# Patient Record
Sex: Female | Born: 1985
Health system: Southern US, Community
[De-identification: ages and names within clinical notes are randomized; demographics above are authoritative.]

## PROBLEM LIST (undated history)

## (undated) ENCOUNTER — Inpatient Hospital Stay (HOSPITAL_COMMUNITY): Payer: Self-pay

## (undated) DIAGNOSIS — O99345 Other mental disorders complicating the puerperium: Secondary | ICD-10-CM

## (undated) DIAGNOSIS — K219 Gastro-esophageal reflux disease without esophagitis: Secondary | ICD-10-CM

## (undated) DIAGNOSIS — F53 Postpartum depression: Secondary | ICD-10-CM

## (undated) DIAGNOSIS — F419 Anxiety disorder, unspecified: Secondary | ICD-10-CM

## (undated) DIAGNOSIS — Z9889 Other specified postprocedural states: Secondary | ICD-10-CM

## (undated) DIAGNOSIS — R112 Nausea with vomiting, unspecified: Secondary | ICD-10-CM

## (undated) DIAGNOSIS — N200 Calculus of kidney: Secondary | ICD-10-CM

## (undated) DIAGNOSIS — O1490 Unspecified pre-eclampsia, unspecified trimester: Secondary | ICD-10-CM

## (undated) DIAGNOSIS — R519 Headache, unspecified: Secondary | ICD-10-CM

## (undated) DIAGNOSIS — B009 Herpesviral infection, unspecified: Secondary | ICD-10-CM

## (undated) DIAGNOSIS — O24419 Gestational diabetes mellitus in pregnancy, unspecified control: Secondary | ICD-10-CM

## (undated) DIAGNOSIS — K4090 Unilateral inguinal hernia, without obstruction or gangrene, not specified as recurrent: Secondary | ICD-10-CM

## (undated) DIAGNOSIS — N390 Urinary tract infection, site not specified: Secondary | ICD-10-CM

## (undated) DIAGNOSIS — Z87442 Personal history of urinary calculi: Secondary | ICD-10-CM

## (undated) HISTORY — PX: ABDOMINAL HYSTERECTOMY: SHX81

## (undated) HISTORY — DX: Anxiety disorder, unspecified: F41.9

## (undated) HISTORY — DX: Urinary tract infection, site not specified: N39.0

## (undated) HISTORY — DX: Calculus of kidney: N20.0

## (undated) HISTORY — DX: Postpartum depression: F53.0

## (undated) HISTORY — PX: WISDOM TOOTH EXTRACTION: SHX21

## (undated) HISTORY — PX: TONSILLECTOMY: SUR1361

## (undated) HISTORY — DX: Herpesviral infection, unspecified: B00.9

## (undated) HISTORY — DX: Other mental disorders complicating the puerperium: O99.345

## (undated) HISTORY — DX: Unilateral inguinal hernia, without obstruction or gangrene, not specified as recurrent: K40.90

## (undated) HISTORY — PX: INGUINAL HERNIA REPAIR: SHX194

---
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
## 2004-03-21 ENCOUNTER — Other Ambulatory Visit: Admission: RE | Admit: 2004-03-21 | Discharge: 2004-03-21 | Payer: Self-pay | Admitting: Obstetrics and Gynecology

## 2004-10-05 HISTORY — PX: TYMPANOPLASTY: SHX33

## 2005-03-31 ENCOUNTER — Other Ambulatory Visit: Admission: RE | Admit: 2005-03-31 | Discharge: 2005-03-31 | Payer: Self-pay | Admitting: Obstetrics and Gynecology

## 2005-10-05 HISTORY — PX: CYSTOSCOPY: SUR368

## 2006-04-01 ENCOUNTER — Other Ambulatory Visit: Admission: RE | Admit: 2006-04-01 | Discharge: 2006-04-01 | Payer: Self-pay | Admitting: Obstetrics & Gynecology

## 2007-04-05 ENCOUNTER — Other Ambulatory Visit: Admission: RE | Admit: 2007-04-05 | Discharge: 2007-04-05 | Payer: Self-pay | Admitting: Obstetrics and Gynecology

## 2007-04-25 ENCOUNTER — Other Ambulatory Visit: Admission: RE | Admit: 2007-04-25 | Discharge: 2007-04-25 | Payer: Self-pay | Admitting: Obstetrics and Gynecology

## 2007-08-24 ENCOUNTER — Other Ambulatory Visit: Admission: RE | Admit: 2007-08-24 | Discharge: 2007-08-24 | Payer: Self-pay | Admitting: Obstetrics and Gynecology

## 2008-04-26 ENCOUNTER — Other Ambulatory Visit: Admission: RE | Admit: 2008-04-26 | Discharge: 2008-04-26 | Payer: Self-pay | Admitting: Obstetrics and Gynecology

## 2012-07-29 LAB — HM PAP SMEAR

## 2013-07-28 ENCOUNTER — Encounter: Payer: Self-pay | Admitting: Obstetrics & Gynecology

## 2013-07-31 ENCOUNTER — Encounter: Payer: Self-pay | Admitting: Nurse Practitioner

## 2013-07-31 ENCOUNTER — Ambulatory Visit (INDEPENDENT_AMBULATORY_CARE_PROVIDER_SITE_OTHER): Payer: BC Managed Care – PPO | Admitting: Nurse Practitioner

## 2013-07-31 VITALS — BP 112/74 | HR 80 | Ht 64.5 in | Wt 133.0 lb

## 2013-07-31 DIAGNOSIS — Z01419 Encounter for gynecological examination (general) (routine) without abnormal findings: Secondary | ICD-10-CM

## 2013-07-31 MED ORDER — DROSPIRENONE-ETHINYL ESTRADIOL 3-0.03 MG PO TABS: 1.0000 | ORAL_TABLET | Freq: Every day | ORAL | Status: DC

## 2013-07-31 NOTE — Patient Instructions (Signed)
General topics  Next pap or exam is  due in 1 year Take a Women's multivitamin Take 1200 mg. of calcium daily - prefer dietary If any concerns in interim to call back  Breast Self-Awareness Practicing breast self-awareness may pick up problems early, prevent significant medical complications, and possibly save your life. By practicing breast self-awareness, you can become familiar with how your breasts look and feel and if your breasts are changing. This allows you to notice changes early. It can also offer you some reassurance that your breast health is good. One way to learn what is normal for your breasts and whether your breasts are changing is to do a breast self-exam. If you find a lump or something that was not present in the past, it is best to contact your caregiver right away. Other findings that should be evaluated by your caregiver include nipple discharge, especially if it is bloody; skin changes or reddening; areas where the skin seems to be pulled in (retracted); or new lumps and bumps. Breast pain is seldom associated with cancer (malignancy), but should also be evaluated by a caregiver. BREAST SELF-EXAM The best time to examine your breasts is 5 7 days after your menstrual period is over.  ExitCare Patient Information 2013 [employer].   Exercise to Stay Healthy Exercise helps you become and stay healthy. EXERCISE IDEAS AND TIPS Choose exercises that:  You enjoy.  Fit into your day. You do not need to exercise really hard to be healthy. You can do exercises at a slow or medium level and stay healthy. You can:  Stretch before and after working out.  Try yoga, Pilates, or tai chi.  Lift weights.  Walk fast, swim, jog, run, climb stairs, bicycle, dance, or rollerskate.  Take aerobic classes. Exercises that burn about 150 calories:  Running 1  miles in 15 minutes.  Playing volleyball for 45 to 60 minutes.  Washing and waxing a car for 45 to 60  minutes.  Playing touch football for 45 minutes.  Walking 1  miles in 35 minutes.  Pushing a stroller 1  miles in 30 minutes.  Playing basketball for 30 minutes.  Raking leaves for 30 minutes.  Bicycling 5 miles in 30 minutes.  Walking 2 miles in 30 minutes.  Dancing for 30 minutes.  Shoveling snow for 15 minutes.  Swimming laps for 20 minutes.  Walking up stairs for 15 minutes.  Bicycling 4 miles in 15 minutes.  Gardening for 30 to 45 minutes.  Jumping rope for 15 minutes.  Washing windows or floors for 45 to 60 minutes. Document Released: 10/24/2010 Document Revised: 12/14/2011 Document Reviewed: 10/24/2010 ExitCare Patient Information 2013 [employer].   Other topics ( that may be useful information):    Sexually Transmitted Disease Sexually transmitted disease (STD) refers to any infection that is passed from person to person during sexual activity. This may happen by way of saliva, semen, blood, vaginal mucus, or urine. Common STDs include:  Gonorrhea.  Chlamydia.  Syphilis.  HIV/AIDS.  Genital herpes.  Hepatitis B and C.  Trichomonas.  Human papillomavirus (HPV).  Pubic lice. CAUSES  An STD may be spread by bacteria, virus, or parasite. A person can get an STD by:  Sexual intercourse with an infected person.  Sharing sex toys with an infected person.  Sharing needles with an infected person.  Having intimate contact with the genitals, mouth, or rectal areas of an infected person. SYMPTOMS  Some people may not have any symptoms, but   they can still pass the infection to others. Different STDs have different symptoms. Symptoms include:  Painful or bloody urination.  Pain in the pelvis, abdomen, vagina, anus, throat, or eyes.  Skin rash, itching, irritation, growths, or sores (lesions). These usually occur in the genital or anal area.  Abnormal vaginal discharge.  Penile discharge in men.  Soft, flesh-colored skin growths in the  genital or anal area.  Fever.  Pain or bleeding during sexual intercourse.  Swollen glands in the groin area.  Yellow skin and eyes (jaundice). This is seen with hepatitis. DIAGNOSIS  To make a diagnosis, your caregiver may:  Take a medical history.  Perform a physical exam.  Take a specimen (culture) to be examined.  Examine a sample of discharge under a microscope.  Perform blood test TREATMENT   Chlamydia, gonorrhea, trichomonas, and syphilis can be cured with antibiotic medicine.  Genital herpes, hepatitis, and HIV can be treated, but not cured, with prescribed medicines. The medicines will lessen the symptoms.  Genital warts from HPV can be treated with medicine or by freezing, burning (electrocautery), or surgery. Warts may come back.  HPV is a virus and cannot be cured with medicine or surgery.However, abnormal areas may be followed very closely by your caregiver and may be removed from the cervix, vagina, or vulva through office procedures or surgery. If your diagnosis is confirmed, your recent sexual partners need treatment. This is true even if they are symptom-free or have a negative culture or evaluation. They should not have sex until their caregiver says it is okay. HOME CARE INSTRUCTIONS  All sexual partners should be informed, tested, and treated for all STDs.  Take your antibiotics as directed. Finish them even if you start to feel better.  Only take over-the-counter or prescription medicines for pain, discomfort, or fever as directed by your caregiver.  Rest.  Eat a balanced diet and drink enough fluids to keep your urine clear or pale yellow.  Do not have sex until treatment is completed and you have followed up with your caregiver. STDs should be checked after treatment.  Keep all follow-up appointments, Pap tests, and blood tests as directed by your caregiver.  Only use latex condoms and water-soluble lubricants during sexual activity. Do not use  petroleum jelly or oils.  Avoid alcohol and illegal drugs.  Get vaccinated for HPV and hepatitis. If you have not received these vaccines in the past, talk to your caregiver about whether one or both might be right for you.  Avoid risky sex practices that can break the skin. The only way to avoid getting an STD is to avoid all sexual activity.Latex condoms and dental dams (for oral sex) will help lessen the risk of getting an STD, but will not completely eliminate the risk. SEEK MEDICAL CARE IF:   You have a fever.  You have any new or worsening symptoms. Document Released: 12/12/2002 Document Revised: 12/14/2011 Document Reviewed: 12/19/2010 ExitCare Patient Information 2013 [employer].    Domestic Abuse You are being battered or abused if someone close to you hits, pushes, or physically hurts you in any way. You also are being abused if you are forced into activities. You are being sexually abused if you are forced to have sexual contact of any kind. You are being emotionally abused if you are made to feel worthless or if you are constantly threatened. It is important to remember that help is available. No one has the right to abuse you. PREVENTION OF FURTHER   ABUSE  Learn the warning signs of danger. This varies with situations but may include: the use of alcohol, threats, isolation from friends and family, or forced sexual contact. Leave if you feel that violence is going to occur.  If you are attacked or beaten, report it to the police so the abuse is documented. You do not have to press charges. The police can protect you while you or the attackers are leaving. Get the officer's name and badge number and a copy of the report.  Find someone you can trust and tell them what is happening to you: your caregiver, a nurse, clergy member, close friend or family member. Feeling ashamed is natural, but remember that you have done nothing wrong. No one deserves abuse. Document Released:  09/18/2000 Document Revised: 12/14/2011 Document Reviewed: 11/27/2010 ExitCare Patient Information 2013 [employer].    How Much is Too Much Alcohol? Drinking too much alcohol can cause injury, accidents, and health problems. These types of problems can include:   Car crashes.  Falls.  Family fighting (domestic violence).  Drowning.  Fights.  Injuries.  Burns.  Damage to certain organs.  Having a baby with birth defects. ONE DRINK CAN BE TOO MUCH WHEN YOU ARE:  Working.  Pregnant or breastfeeding.  Taking medicines. Ask your doctor.  Driving or planning to drive. If you or someone you know has a drinking problem, get help from a doctor.  Document Released: 07/18/2009 Document Revised: 12/14/2011 Document Reviewed: 07/18/2009 ExitCare Patient Information 2013 [employer].   Smoking Hazards Smoking cigarettes is extremely bad for your health. Tobacco smoke has over 200 known poisons in it. There are over 60 chemicals in tobacco smoke that cause cancer. Some of the chemicals found in cigarette smoke include:   Cyanide.  Benzene.  Formaldehyde.  Methanol (wood alcohol).  Acetylene (fuel used in welding torches).  Ammonia. Cigarette smoke also contains the poisonous gases nitrogen oxide and carbon monoxide.  Cigarette smokers have an increased risk of many serious medical problems and Smoking causes approximately:  90% of all lung cancer deaths in men.  80% of all lung cancer deaths in women.  90% of deaths from chronic obstructive lung disease. Compared with nonsmokers, smoking increases the risk of:  Coronary heart disease by 2 to 4 times.  Stroke by 2 to 4 times.  Men developing lung cancer by 23 times.  Women developing lung cancer by 13 times.  Dying from chronic obstructive lung diseases by 12 times.  . Smoking is the most preventable cause of death and disease in our society.  WHY IS SMOKING ADDICTIVE?  Nicotine is the chemical  agent in tobacco that is capable of causing addiction or dependence.  When you smoke and inhale, nicotine is absorbed rapidly into the bloodstream through your lungs. Nicotine absorbed through the lungs is capable of creating a powerful addiction. Both inhaled and non-inhaled nicotine may be addictive.  Addiction studies of cigarettes and spit tobacco show that addiction to nicotine occurs mainly during the teen years, when young people begin using tobacco products. WHAT ARE THE BENEFITS OF QUITTING?  There are many health benefits to quitting smoking.   Likelihood of developing cancer and heart disease decreases. Health improvements are seen almost immediately.  Blood pressure, pulse rate, and breathing patterns start returning to normal soon after quitting. QUITTING SMOKING   American Lung Association - 1-800-LUNGUSA  American Cancer Society - 1-800-ACS-2345 Document Released: 10/29/2004 Document Revised: 12/14/2011 Document Reviewed: 07/03/2009 ExitCare Patient Information 2013 Exit[employer]   LL[employer].   Stress Management Stress is a state of physical or mental tension that often results from changes in your life or normal routine. Some common causes of stress are:  Death of a loved one.  Injuries or severe illnesses.  Getting fired or changing jobs.  Moving into a new home. Other causes may be:  Sexual problems.  Business or financial losses.  Taking on a large debt.  Regular conflict with someone at home or at work.  Constant tiredness from lack of sleep. It is not just bad things that are stressful. It may be stressful to:  Win the lottery.  Get married.  Buy a new car. The amount of stress that can be easily tolerated varies from person to person. Changes generally cause stress, regardless of the types of change. Too much stress can affect your health. It may lead to physical or emotional problems. Too little stress (boredom) may also become stressful. SUGGESTIONS TO  REDUCE STRESS:  Talk things over with your family and friends. It often is helpful to share your concerns and worries. If you feel your problem is serious, you may want to get help from a professional counselor.  Consider your problems one at a time instead of lumping them all together. Trying to take care of everything at once may seem impossible. List all the things you need to do and then start with the most important one. Set a goal to accomplish 2 or 3 things each day. If you expect to do too many in a single day you will naturally fail, causing you to feel even more stressed.  Do not use alcohol or drugs to relieve stress. Although you may feel better for a short time, they do not remove the problems that caused the stress. They can also be habit forming.  Exercise regularly - at least 3 times per week. Physical exercise can help to relieve that "uptight" feeling and will relax you.  The shortest distance between despair and hope is often a good night's sleep.  Go to bed and get up on time allowing yourself time for appointments without being rushed.  Take a short "time-out" period from any stressful situation that occurs during the day. Close your eyes and take some deep breaths. Starting with the muscles in your face, tense them, hold it for a few seconds, then relax. Repeat this with the muscles in your neck, shoulders, hand, stomach, back and legs.  Take good care of yourself. Eat a balanced diet and get plenty of rest.  Schedule time for having fun. Take a break from your daily routine to relax. HOME CARE INSTRUCTIONS   Call if you feel overwhelmed by your problems and feel you can no longer manage them on your own.  Return immediately if you feel like hurting yourself or someone else. Document Released: 03/17/2001 Document Revised: 12/14/2011 Document Reviewed: 11/07/2007 ExitCare Patient Information 2013 [employer].   

## 2013-07-31 NOTE — Progress Notes (Signed)
Patient ID: Traci Sweeney, female   DOB: 1986-03-08, 27 y.o.   MRN: [id number] 27 y.o. G0P0 Married Caucasian Fe here for annual exam.  Got married in July.   Doing very well.  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 They do not plan a pregnancy for a while.  Her cycles are normal on OCP.  With her PCP she is on Spironolactone for acne before the wedding and has remained on this.  Patient's last menstrual period was 07/26/2013.          Sexually active: yes  The current method of family planning is OCP (estrogen/progesterone).    Exercising: no   Smoker:  Former, quit 08/05/11  Health Maintenance: Pap: 07/29/12, WNL TDaP: ? Gardasil: completed in 2007 Labs: declined   reports that she quit smoking about 1 years ago. Her smoking use included Cigarettes. She smoked 0.00 packs per day for 8 years. She has never used smokeless tobacco. She reports that she drinks about 0.5 ounces of alcohol per week. She reports that she does not use illicit drugs.  Past Medical History  Diagnosis Date  . HSV-1 infection   . Inguinal hernia infant    bilateral    Past Surgical History  Procedure Laterality Date  . Inguinal hernia repair Bilateral infant    Current Outpatient Prescriptions  Medication Sig Dispense Refill  . Multiple Vitamins-Minerals (HAIR/SKIN/NAILS PO) Take 1 tablet by mouth daily.      Marland Kitchen spironolactone (ALDACTONE) 25 MG tablet Take 1 tablet by mouth daily.      Marland Kitchen ZARAH 3-0.03 MG tablet Take 1 tablet by mouth daily.       No current facility-administered medications for this visit.    Family History  Problem Relation Age of Onset  . Hypertension Father   . Cancer Maternal Uncle     ROS:  Pertinent items are noted in HPI.  Otherwise, a comprehensive ROS was negative.  Exam:   BP 112/74  Pulse 80  Ht 5' 4.5" (1.638 m)  Wt 133 lb (60.328 kg)  BMI 22.48 kg/m2  LMP 07/26/2013 Height: 5' 4.5" (163.8 cm)  Ht Readings from Last 3 Encounters:   07/31/13 5' 4.5" (1.638 m)    General appearance: alert, cooperative and appears stated age Head: Normocephalic, without obvious abnormality, atraumatic Neck: no adenopathy, supple, symmetrical, trachea midline and thyroid normal to inspection and palpation Lungs: clear to auscultation bilaterally Breasts: normal appearance, no masses or tenderness Heart: regular rate and rhythm Abdomen: soft, non-tender; no masses,  no organomegaly Extremities: extremities normal, atraumatic, no cyanosis or edema Skin: Skin color, texture, turgor normal. No rashes or lesions Lymph nodes: Cervical, supraclavicular, and axillary nodes normal. No abnormal inguinal nodes palpated Neurologic: Grossly normal   Pelvic: External genitalia:  no lesions              Urethra:  normal appearing urethra with no masses, tenderness or lesions              Bartholin's and Skene's: normal                 Vagina: normal appearing vagina with normal color and discharge, no lesions              Cervix: anteverted              Pap taken: no Bimanual Exam:  Uterus:  normal size, contour, position, consistency, mobility, non-tender  Adnexa: no mass, fullness, tenderness               Rectovaginal: Confirms               Anus:  normal sphincter tone, no lesions  A:  Well Woman with normal exam  Contraception   P:   Pap smear as per guidelines Not done today  Refill Yasmin for a year.  She will also be checking on immunization records to verify MMR II and TDaP  Counseled on breast self exam, adequate intake of calcium and vitamin D, diet and exercise return annually or prn  An After Visit Summary was printed and given to the patient.

## 2013-08-03 NOTE — Progress Notes (Signed)
Encounter reviewed by Dr. Brook Silva.  

## 2013-08-25 ENCOUNTER — Other Ambulatory Visit: Payer: Self-pay | Admitting: Nurse Practitioner

## 2014-04-13 ENCOUNTER — Encounter: Payer: Self-pay | Admitting: Nurse Practitioner

## 2014-04-13 ENCOUNTER — Ambulatory Visit (INDEPENDENT_AMBULATORY_CARE_PROVIDER_SITE_OTHER): Payer: BC Managed Care – PPO | Admitting: Nurse Practitioner

## 2014-04-13 VITALS — BP 114/62 | HR 68 | Ht 64.5 in | Wt 149.0 lb

## 2014-04-13 DIAGNOSIS — Z3169 Encounter for other general counseling and advice on procreation: Secondary | ICD-10-CM

## 2014-04-13 MED ORDER — FLUCONAZOLE 150 MG PO TABS: 150.0000 mg | ORAL_TABLET | Freq: Once | ORAL | Status: DC

## 2014-04-13 NOTE — Progress Notes (Deleted)
28 y.o. Married White female   G0P0 here for pre-conceptual counseling along with her husband of 1 year.     GYN Hx: Menarche:  Age 28 Patient's last menstrual period was 04/06/2014.Marland Kitchen.  Length of cycle:  28 Length of menses:  4-5 Any STD Hx?  Yes diagnosed with HSV I in 05/2007 Current birth control and last time used:  Zarah  Currently taking            Sexually active: Yes.    The current method of family planning is OCP (estrogen/progesterone).    Exercising: Yes.    home and walking Hormones:   reports that she quit smoking about 2 years ago. Her smoking use included Cigarettes. She has a 4 pack-year smoking history. She has never used smokeless tobacco. She reports that she drinks about .5 ounces of alcohol per week. She reports that she does not use illicit drugs.  Health Maintenance  Topic Date Due  . Tetanus/tdap  11/04/2003  . Influenza Vaccine  05/05/2014  . Pap Smear  07/30/2015    Family History  Problem Relation Age of Onset  . Lung cancer Paternal Grandfather     deceased  . Diabetes Father   . Heart attack Paternal Grandfather [id number]    deceased  . Hypertension Mother   . Thyroid disease Mother   . Heart disease Mother   . Hypertension Brother   . Lymphoma Paternal Uncle     deceased  . Lung cancer Maternal Grandfather     deceased    There are no active problems to display for this patient.   Past Medical History  Diagnosis Date  . HSV-1 infection   . Inguinal hernia infant    bilateral  . UTI (lower urinary tract infection) 2006 - 2007     Dr. Sherron MondayMacDiarmid uro eval seconday to frequent infection with negative cysto    Past Surgical History  Procedure Laterality Date  . Inguinal hernia repair Bilateral infant  . Tonsillectomy  age 28  . Myringotomy  2005  . Cystoscopy  10/2005    negative  . Tympanoplasty  2006    Allergies: @RRALLERGIES @  Current Outpatient Prescriptions  Medication Sig Dispense Refill  . drospirenone-ethinyl estradiol  (ZARAH) 3-0.03 MG tablet Take 1 tablet by mouth daily.  3 Package  3  . Multiple Vitamins-Minerals (HAIR/SKIN/NAILS PO) Take 1 tablet by mouth daily.      . fluconazole (DIFLUCAN) 150 MG tablet Take 1 tablet (150 mg total) by mouth once. Take one tablet.  Repeat in 48 hours if symptoms are not completely resolved.  2 tablet  1   No current facility-administered medications for this visit.

## 2014-04-13 NOTE — Progress Notes (Signed)
28 y.o.Married Caucasian  Female here for preconceptual counseling along with her husband.  Gynecological History:    Menarche:13 LMP:  04/04/14 Length of cycle:28 days Length of Menses: 4-5 days GYN infectious disease history:  (Abnormal pap, venereal warts, herpes, or other STD's :  Yes  HSV I 05/2007 Current Birth Control method:OCP Last time birth control was used:currently  PMH:  Any history of DM, HTN, epilepsy, Heart Murmur, or thyroid problems? No   If so, when did it begin? Are you or have you ever been anemic?Yes If so for how long? 1-2 years in 2006 Have you ever had any accidents? No What type? Do you have any allergies? No Do you take any sedatives or tranquilizers? No Any domestic violence? No Any medications? Yes   (such as medications's for acne - certain medications can cause birth defects and Ace inhibitors can cause kidney problems in the fetus.)  History of  Being Spironolactone for acne and now off for about a month.  Patient's Past Medical History:  Have you ever had surgery related to female organs? No Past pregnancies/ complications/ or miscarriages/ abortions? No ETOH? Yes-  social only Tobacco Use?No Drug use? No  Reviewed Medication list: Yes Current job exposure risk - toxins/ Lead/ Mercury No Hot tub/ sauna use? No Do you commonly run long distance or do strenuous exercise? No Do you eat a strict vegetarian diet? No  Partners Past Medical History:  Have you ever had surgery related to female organs? No Previous Paternity? No Testicular Injury? No ETOH No Tobacco use: No Drug use No Current medications: none Current job exposure risk toxins/ lead/ mercury No Hot/ tub sauna use? No Do you commonly run long distance or do strenuous exercise? No   Patient's Family Medical history: No Partners Family Medical History: No  Ethnic background? Mediterranean/ Asian/Chinese/ Ashkenazi Jews / [place]  French - [place]   Patients FMH:      Partners FMH: Multiple Births No   Multiple Births No Genetic Disorders No   Genetic Disorders No  Sickle cell      Sickle Cell  Hemophilia      Hemophilia  Cystic Fibrosis     Cystic Fibrosis  Mental retardation     Mental retardation  Downs Syndrome     Downs Syndrome  Immunization Updates: Rubella Vaccine/ titer Yes but unsure of date - will get information and call back Chicken Pox / vaccination Yes disease Toxoplasmosis exposure (no changing litter box) No Tdap for pt. and partner Yes but unsure of date - will call back; husband will need to get update Hepatitis B (if at high risk) Yes Influenza vaccine who may get pregnant during the flu season Yes   Recommendations:   No ETOH / Tobacco / Drugs  Limit Caffeine  No Artificial Sweeteners  No raw beef  Restrict High fat foods  Limit servings of large fish (e.g., swordfish) to 1 X month  Foods associated with Listeria transmission ( e.g., sliced delicatessen meats &  Cheese)  No hot / tub Saunas  OTC med list  Counseling:   Normal pregnancy rates 80 % within 1 year  Rx. Prenatal Multivitamins  Discussion of timing of intercourse to ovulation  Labs: none at this time  Handouts given  Time spent with patient and (husband) significant other: 25 minutes

## 2014-04-13 NOTE — Patient Instructions (Signed)
Preparing for Pregnancy Before trying to become pregnant, make an appointment with your health care provider (preconception care). The goal is to help you have a healthy, safe pregnancy. At your first appointment, your health care provider will:   Do a complete physical exam, including a Pap test.  Take a complete medical history.  Give you advice and help you resolve any problems. PRECONCEPTION CHECKLIST Here is a list of the basics to cover with your health care provider at your preconception visit:  Medical history.  Tell your health care provider about any diseases you have had. Many diseases can affect your pregnancy.  Include your partner's medical history and family history.  Make sure you have been tested for sexually transmitted infections (STIs). These can affect your pregnancy. In some cases, they can be passed to your baby. Tell your health care provider about any history of STIs.  Make sure your health care provider knows about any previous problems you have had with conception or pregnancy.  Tell your health care provider about any medicine you take. This includes herbal supplements and over-the-counter medicines.  Make sure all your immunizations are up-to-date. You may need to make additional appointments.  Ask your health care provider if you need any vaccinations or if there are any you should avoid.  Diet.  It is especially important to eat a healthy, balanced diet with the right nutrients when you are pregnant.  Ask your health care provider to help you get to a healthy weight before pregnancy.  If you are overweight, you are at higher risk for certain complications. These include high blood pressure, diabetes, and preterm birth.  If you are underweight, you are more likely to have a low-birth-weight baby.  Lifestyle.  Tell your health care provider about lifestyle factors such as alcohol use, drug use, or smoking.  Describe any harmful substances you may  be exposed to at work or home. These can include chemicals, pesticides, and radiation.  Mental health.  Let your health care provider know if you have been feeling depressed or anxious.  Let your health care provider know if you have a history of substance abuse.  Let your health care provider know if you do not feel safe at home. HOME INSTRUCTIONS TO PREPARE FOR PREGNANCY Follow your health care provider's advice and instructions.   Keep an accurate record of your menstrual periods. This makes it easier for your health care provider to determine your baby's due date.  Begin taking prenatal vitamins and folic acid supplements daily. Take them as directed by your health care provider.  Eat a balanced diet. Get help from a nutrition counselor if you have questions or need help.  Get regular exercise. Try to be active for at least 30 minutes a day most days of the week.  Quit smoking, if you smoke.  Do not drink alcohol.  Do not take illegal drugs.  Get medical problems, such as diabetes or high blood pressure, under control.  If you have diabetes, make sure you do the following:  Have good blood sugar control. If you have type 1 diabetes, use multiple daily doses of insulin. Do not use split-dose or premixed insulin.  Have an eye exam by a qualified eye care professional trained in caring for people with diabetes.  Get evaluated by your health care provider for cardiovascular disease.  Get to a healthy weight. If you are overweight or obese, reduce your weight with the help of a qualified health professional such  as a regisitered dietitian. Ask your health care provider what the right weight range is for you. HOW DO I KNOW I AM PREGNANT? You may be pregnant if you have been sexually active and you miss your period. Symptoms of early pregnancy include:   Mild cramping.  Very light vaginal bleeding (spotting).  Feeling unusually tired.  Morning sickness. If you have any of  these symptoms, take a home pregnancy test. These tests look for a hormone called human chorionic gonadotropin (hCG) in your urine. Your body begins to make this hormone during early pregnancy. These tests are very accurate. Wait until at least the first day you miss your period to take one. If you get a positive result, call your health care provider to make appointments for prenatal care. WHAT SHOULD I DO IF I BECOME PREGNANT?  Make an appointment with your health care provider by week 12 of your pregnancy at the latest.  Do not smoke. Smoking can be harmful to your baby.  Do not drink alcoholic beverages. Alcohol is related to a number of birth defects.  Avoid toxic odors and chemicals.  You may continue to have sexual intercourse if it does not cause pain or other problems, such as vaginal bleeding. Document Released: 09/03/2008 Document Revised: 09/26/2013 Document Reviewed: 08/28/2013 [facility] Patient Information 2015 [place]. This information is not intended to replace advice given to you by your health care provider. Make sure you discuss any questions you have with your health care provider.

## 2014-04-16 ENCOUNTER — Telehealth: Payer: Self-pay | Admitting: Nurse Practitioner

## 2014-04-16 NOTE — Progress Notes (Signed)
Note reviewed, agree with plan.  Ewa Hipp, MD  

## 2014-04-17 ENCOUNTER — Encounter: Payer: Self-pay | Admitting: Nurse Practitioner

## 2014-08-02 ENCOUNTER — Encounter: Payer: Self-pay | Admitting: Nurse Practitioner

## 2014-08-02 ENCOUNTER — Ambulatory Visit (INDEPENDENT_AMBULATORY_CARE_PROVIDER_SITE_OTHER): Payer: BC Managed Care – PPO | Admitting: Nurse Practitioner

## 2014-08-02 VITALS — BP 120/66 | HR 72 | Resp 18 | Ht 65.0 in | Wt 145.0 lb

## 2014-08-02 DIAGNOSIS — Z01419 Encounter for gynecological examination (general) (routine) without abnormal findings: Secondary | ICD-10-CM

## 2014-08-02 DIAGNOSIS — Z0000 Encounter for general adult medical examination without abnormal findings: Secondary | ICD-10-CM

## 2014-08-02 LAB — POCT URINALYSIS DIPSTICK
Bilirubin, UA: NEGATIVE
Glucose, UA: NEGATIVE
Ketones, UA: NEGATIVE
Leukocytes, UA: NEGATIVE
Nitrite, UA: NEGATIVE
Protein, UA: NEGATIVE
Urobilinogen, UA: NEGATIVE
pH, UA: 5

## 2014-08-02 LAB — HEMOGLOBIN, FINGERSTICK: Hemoglobin, fingerstick: 13.8 g/dL (ref 12.0–16.0)

## 2014-08-02 NOTE — Patient Instructions (Addendum)
General topics  Next pap or exam is  due in 1 year Take a Women's multivitamin Take 1200 mg. of calcium daily - prefer dietary If any concerns in interim to call back  Breast Self-Awareness Practicing breast self-awareness may pick up problems early, prevent significant medical complications, and possibly save your life. By practicing breast self-awareness, you can become familiar with how your breasts look and feel and if your breasts are changing. This allows you to notice changes early. It can also offer you some reassurance that your breast health is good. One way to learn what is normal for your breasts and whether your breasts are changing is to do a breast self-exam. If you find a lump or something that was not present in the past, it is best to contact your caregiver right away. Other findings that should be evaluated by your caregiver include nipple discharge, especially if it is bloody; skin changes or reddening; areas where the skin seems to be pulled in (retracted); or new lumps and bumps. Breast pain is seldom associated with cancer (malignancy), but should also be evaluated by a caregiver. BREAST SELF-EXAM The best time to examine your breasts is 5 7 days after your menstrual period is over.  ExitCare Patient Information 2013 [employer], [employer].   Exercise to Stay Healthy Exercise helps you become and stay healthy. EXERCISE IDEAS AND TIPS Choose exercises that:  You enjoy.  Fit into your day. You do not need to exercise really hard to be healthy. You can do exercises at a slow or medium level and stay healthy. You can:  Stretch before and after working out.  Try yoga, Pilates, or tai chi.  Lift weights.  Walk fast, swim, jog, run, climb stairs, bicycle, dance, or rollerskate.  Take aerobic classes. Exercises that burn about 150 calories:  Running 1  miles in 15 minutes.  Playing volleyball for 45 to 60 minutes.  Washing and waxing a car for 45 to 60  minutes.  Playing touch football for 45 minutes.  Walking 1  miles in 35 minutes.  Pushing a stroller 1  miles in 30 minutes.  Playing basketball for 30 minutes.  Raking leaves for 30 minutes.  Bicycling 5 miles in 30 minutes.  Walking 2 miles in 30 minutes.  Dancing for 30 minutes.  Shoveling snow for 15 minutes.  Swimming laps for 20 minutes.  Walking up stairs for 15 minutes.  Bicycling 4 miles in 15 minutes.  Gardening for 30 to 45 minutes.  Jumping rope for 15 minutes.  Washing windows or floors for 45 to 60 minutes. Document Released: 10/24/2010 Document Revised: 12/14/2011 Document Reviewed: 10/24/2010 ExitCare Patient Information 2013 ExitCare[employer].   Other topics ( that may be useful information):    Sexually Transmitted Disease Sexually transmitted disease (STD) refers to any infection that is passed from person to person during sexual activity. This may happen by way of saliva, semen, blood, vaginal mucus, or urine. Common STDs include:  Gonorrhea.  Chlamydia.  Syphilis.  HIV/AIDS.  Genital herpes.  Hepatitis B and C.  Trichomonas.  Human papillomavirus (HPV).  Pubic lice. CAUSES  An STD may be spread by bacteria, virus, or parasite. A person can get an STD by:  Sexual intercourse with an infected person.  Sharing sex toys with an infected person.  Sharing needles with an infected person.  Having intimate contact with the genitals, mouth, or rectal areas of an infected person. SYMPTOMS  Some people may not have any symptoms, but   they can still pass the infection to others. Different STDs have different symptoms. Symptoms include:  Painful or bloody urination.  Pain in the pelvis, abdomen, vagina, anus, throat, or eyes.  Skin rash, itching, irritation, growths, or sores (lesions). These usually occur in the genital or anal area.  Abnormal vaginal discharge.  Penile discharge in men.  Soft, flesh-colored skin growths in the  genital or anal area.  Fever.  Pain or bleeding during sexual intercourse.  Swollen glands in the groin area.  Yellow skin and eyes (jaundice). This is seen with hepatitis. DIAGNOSIS  To make a diagnosis, your caregiver may:  Take a medical history.  Perform a physical exam.  Take a specimen (culture) to be examined.  Examine a sample of discharge under a microscope.  Perform blood test TREATMENT   Chlamydia, gonorrhea, trichomonas, and syphilis can be cured with antibiotic medicine.  Genital herpes, hepatitis, and HIV can be treated, but not cured, with prescribed medicines. The medicines will lessen the symptoms.  Genital warts from HPV can be treated with medicine or by freezing, burning (electrocautery), or surgery. Warts may come back.  HPV is a virus and cannot be cured with medicine or surgery.However, abnormal areas may be followed very closely by your caregiver and may be removed from the cervix, vagina, or vulva through office procedures or surgery. If your diagnosis is confirmed, your recent sexual partners need treatment. This is true even if they are symptom-free or have a negative culture or evaluation. They should not have sex until their caregiver says it is okay. HOME CARE INSTRUCTIONS  All sexual partners should be informed, tested, and treated for all STDs.  Take your antibiotics as directed. Finish them even if you start to feel better.  Only take over-the-counter or prescription medicines for pain, discomfort, or fever as directed by your caregiver.  Rest.  Eat a balanced diet and drink enough fluids to keep your urine clear or pale yellow.  Do not have sex until treatment is completed and you have followed up with your caregiver. STDs should be checked after treatment.  Keep all follow-up appointments, Pap tests, and blood tests as directed by your caregiver.  Only use latex condoms and water-soluble lubricants during sexual activity. Do not use  petroleum jelly or oils.  Avoid alcohol and illegal drugs.  Get vaccinated for HPV and hepatitis. If you have not received these vaccines in the past, talk to your caregiver about whether one or both might be right for you.  Avoid risky sex practices that can break the skin. The only way to avoid getting an STD is to avoid all sexual activity.Latex condoms and dental dams (for oral sex) will help lessen the risk of getting an STD, but will not completely eliminate the risk. SEEK MEDICAL CARE IF:   You have a fever.  You have any new or worsening symptoms. Document Released: 12/12/2002 Document Revised: 12/14/2011 Document Reviewed: 12/19/2010 [facility] Patient Information 2013 [place].    Domestic Abuse You are being battered or abused if someone close to you hits, pushes, or physically hurts you in any way. You also are being abused if you are forced into activities. You are being sexually abused if you are forced to have sexual contact of any kind. You are being emotionally abused if you are made to feel worthless or if you are constantly threatened. It is important to remember that help is available. No one has the right to abuse you. PREVENTION OF FURTHER  ABUSE  Learn the warning signs of danger. This varies with situations but may include: the use of alcohol, threats, isolation from friends and family, or forced sexual contact. Leave if you feel that violence is going to occur.  If you are attacked or beaten, report it to the police so the abuse is documented. You do not have to press charges. The police can protect you while you or the attackers are leaving. Get the officer's name and badge number and a copy of the report.  Find someone you can trust and tell them what is happening to you: your caregiver, a nurse, clergy member, close friend or family member. Feeling ashamed is natural, but remember that you have done nothing wrong. No one deserves abuse. Document Released:  09/18/2000 Document Revised: 12/14/2011 Document Reviewed: 11/27/2010 ExitCare Patient Information 2013 ExitCare[employer] LLC.    How Much is Too Much Alcohol? Drinking too much alcohol can cause injury, accidents, and health problems. These types of problems can include:   Car crashes.  Falls.  Family fighting (domestic violence).  Drowning.  Fights.  Injuries.  Burns.  Damage to certain organs.  Having a baby with birth defects. ONE DRINK CAN BE TOO MUCH WHEN YOU ARE:  Working.  Pregnant or breastfeeding.  Taking medicines. Ask your doctor.  Driving or planning to drive. If you or someone you know has a drinking problem, get help from a doctor.  Document Released: 07/18/2009 Document Revised: 12/14/2011 Document Reviewed: 07/18/2009 ExitCare Patient Information 2013 ExitCare[employer].   Smoking Hazards Smoking cigarettes is extremely bad for your health. Tobacco smoke has over 200 known poisons in it. There are over 60 chemicals in tobacco smoke that cause cancer. Some of the chemicals found in cigarette smoke include:   Cyanide.  Benzene.  Formaldehyde.  Methanol (wood alcohol).  Acetylene (fuel used in welding torches).  Ammonia. Cigarette smoke also contains the poisonous gases nitrogen oxide and carbon monoxide.  Cigarette smokers have an increased risk of many serious medical problems and Smoking causes approximately:  90% of all lung cancer deaths in men.  80% of all lung cancer deaths in women.  90% of deaths from chronic obstructive lung disease. Compared with nonsmokers, smoking increases the risk of:  Coronary heart disease by 2 to 4 times.  Stroke by 2 to 4 times.  Men developing lung cancer by 23 times.  Women developing lung cancer by 13 times.  Dying from chronic obstructive lung diseases by 12 times.  . Smoking is the most preventable cause of death and disease in our society.  WHY IS SMOKING ADDICTIVE?  Nicotine is the chemical  agent in tobacco that is capable of causing addiction or dependence.  When you smoke and inhale, nicotine is absorbed rapidly into the bloodstream through your lungs. Nicotine absorbed through the lungs is capable of creating a powerful addiction. Both inhaled and non-inhaled nicotine may be addictive.  Addiction studies of cigarettes and spit tobacco show that addiction to nicotine occurs mainly during the teen years, when young people begin using tobacco products. WHAT ARE THE BENEFITS OF QUITTING?  There are many health benefits to quitting smoking.   Likelihood of developing cancer and heart disease decreases. Health improvements are seen almost immediately.  Blood pressure, pulse rate, and breathing patterns start returning to normal soon after quitting. QUITTING SMOKING   American Lung Association - 1-800-LUNGUSA  American Cancer Society - 1-800-ACS-2345 Document Released: 10/29/2004 Document Revised: 12/14/2011 Document Reviewed: 07/03/2009 ExitCare Patient Information 2013 ExitCare,   LLC.   Stress Management Stress is a state of physical or mental tension that often results from changes in your life or normal routine. Some common causes of stress are:  Death of a loved one.  Injuries or severe illnesses.  Getting fired or changing jobs.  Moving into a new home. Other causes may be:  Sexual problems.  Business or financial losses.  Taking on a large debt.  Regular conflict with someone at home or at work.  Constant tiredness from lack of sleep. It is not just bad things that are stressful. It may be stressful to:  Win the lottery.  Get married.  Buy a new car. The amount of stress that can be easily tolerated varies from person to person. Changes generally cause stress, regardless of the types of change. Too much stress can affect your health. It may lead to physical or emotional problems. Too little stress (boredom) may also become stressful. SUGGESTIONS TO  REDUCE STRESS:  Talk things over with your family and friends. It often is helpful to share your concerns and worries. If you feel your problem is serious, you may want to get help from a professional counselor.  Consider your problems one at a time instead of lumping them all together. Trying to take care of everything at once may seem impossible. List all the things you need to do and then start with the most important one. Set a goal to accomplish 2 or 3 things each day. If you expect to do too many in a single day you will naturally fail, causing you to feel even more stressed.  Do not use alcohol or drugs to relieve stress. Although you may feel better for a short time, they do not remove the problems that caused the stress. They can also be habit forming.  Exercise regularly - at least 3 times per week. Physical exercise can help to relieve that "uptight" feeling and will relax you.  The shortest distance between despair and hope is often a good night's sleep.  Go to bed and get up on time allowing yourself time for appointments without being rushed.  Take a short "time-out" period from any stressful situation that occurs during the day. Close your eyes and take some deep breaths. Starting with the muscles in your face, tense them, hold it for a few seconds, then relax. Repeat this with the muscles in your neck, shoulders, hand, stomach, back and legs.  Take good care of yourself. Eat a balanced diet and get plenty of rest.  Schedule time for having fun. Take a break from your daily routine to relax. HOME CARE INSTRUCTIONS   Call if you feel overwhelmed by your problems and feel you can no longer manage them on your own.  Return immediately if you feel like hurting yourself or someone else. Document Released: 03/17/2001 Document Revised: 12/14/2011 Document Reviewed: 11/07/2007 [facility] Patient Information 2013 [place].   Vit B Complex Vit B

## 2014-08-02 NOTE — Progress Notes (Signed)
28 y.o. G0P0 Married Caucasian Fe here for annual exam.  Off OCP X 3 months.  Menses is 28 -32 days.  Normal flow except this last one which was lighter.  Home UPT was negative at CD # 31.  Some increase in weight and PMS.  No breast tenderness. Will be trying next month for a pregnancy.  She is on prenatal MVI.  Patient's last menstrual period was 07/28/2014.          Sexually active: Yes.    The current method of family planning is none.    Exercising: No.  The patient does not participate in regular exercise at present. Smoker:  no  Health Maintenance: Pap: 07/2012 Neg TDaP: 2010 Labs: here  UA:RBC:trace (menses) Hg:13.8   reports that she quit smoking about 2 years ago. Her smoking use included Cigarettes. She has a 4 pack-year smoking history. She has never used smokeless tobacco. She reports that she drinks about .5 ounces of alcohol per week. She reports that she does not use illicit drugs.  Past Medical History  Diagnosis Date  . HSV-1 infection   . Inguinal hernia infant    bilateral  . UTI (lower urinary tract infection) 2006 - 2007     Dr. Sherron MondayMacDiarmid uro eval seconday to frequent infection with negative cysto    Past Surgical History  Procedure Laterality Date  . Inguinal hernia repair Bilateral infant  . Tonsillectomy  age 28  . Myringotomy  2005  . Cystoscopy  10/2005    negative  . Tympanoplasty  2006    Current Outpatient Prescriptions  Medication Sig Dispense Refill  . Multiple Vitamins-Minerals (HAIR/SKIN/NAILS PO) Take 1 tablet by mouth daily.       No current facility-administered medications for this visit.    Family History  Problem Relation Age of Onset  . Lung cancer Paternal Grandfather     deceased  . Diabetes Father   . Heart attack Paternal Grandfather [id number]    deceased  . Hypertension Mother   . Thyroid disease Mother   . Heart disease Mother   . Hypertension Brother   . Lymphoma Paternal Uncle     deceased  . Lung cancer Maternal  Grandfather     deceased    ROS:  Pertinent items are noted in HPI.  Otherwise, a comprehensive ROS was negative.  Exam:   BP 120/66  Pulse 72  Resp 18  Ht 5\' 5"  (1.651 m)  Wt 145 lb (65.772 kg)  BMI 24.13 kg/m2  LMP 07/28/2014 Height: 5\' 5"  (165.1 cm)  Ht Readings from Last 3 Encounters:  08/02/14 5\' 5"  (1.651 m)  04/13/14 5' 4.5" (1.638 m)  07/31/13 5' 4.5" (1.638 m)    General appearance: alert, cooperative and appears stated age Head: Normocephalic, without obvious abnormality, atraumatic Neck: no adenopathy, supple, symmetrical, trachea midline and thyroid normal to inspection and palpation Lungs: clear to auscultation bilaterally Breasts: normal appearance, no masses or tenderness Heart: regular rate and rhythm Abdomen: soft, non-tender; no masses,  no organomegaly Extremities: extremities normal, atraumatic, no cyanosis or edema Skin: Skin color, texture, turgor normal. No rashes or lesions Lymph nodes: Cervical, supraclavicular, and axillary nodes normal. No abnormal inguinal nodes palpated Neurologic: Grossly normal   Pelvic: External genitalia:  no lesions              Urethra:  normal appearing urethra with no masses, tenderness or lesions              Bartholin's  and Skene's: normal                 Vagina: normal appearing vagina with normal color and discharge, no lesions              Cervix: anteverted              Pap taken: No. Bimanual Exam:  Uterus:  normal size, contour, position, consistency, mobility, non-tender              Adnexa: no mass, fullness, tenderness               Rectovaginal: Confirms               Anus:  normal sphincter tone, no lesions  A:  Well Woman with normal exam  Trying for pregnancy  Preconceptual counseling wit husband 7/15   P:   Reviewed health and wellness pertinent to exam  Pap smear not taken today  Advise B complex and B 6 for PMS symptoms  Will return if amenorrhea  Continue to monitor menses chart and try for  pregnancy  Counseled on breast self exam, adequate intake of calcium and vitamin D, diet and exercise return annually or prn  An After Visit Summary was printed and given to the patient.

## 2014-08-03 NOTE — Progress Notes (Signed)
Encounter reviewed by Dr. Brookie Wayment Silva.  

## 2014-10-05 NOTE — L&D Delivery Note (Signed)
Cesarean Section Procedure Note  Indications: Failure to progress; Arrest of labor at 9cm  Pre-operative Diagnosis: 38 week 2 day gestation arrest of labor at 9cm. Admitted for IOL for gestational HTN.   Post-operative Diagnosis: same  Surgeon: Wynonia Hazard   Assistants: none  Anesthesia: Spinal anesthesia  ASA Class: 2  Procedure Details   The patient was counseled about the risks, benefits, complications of the cesarean section using an interpreter. The patient concurred with the proposed plan, giving informed consent.  The site of surgery properly noted/marked. The patient was taken to Operating Room # 1, identified as Traci Sweeney and the procedure verified as C-Section Delivery. A Time Out was held and the above information confirmed.  After epidural anesthesia was found to be adequate, the patient was placed in the dorsal supine position with a leftward tilt. The abdomen was prepped and draped in normal uterine fashion.  A Pfannenstiel incision was made and carried down through the subcutaneous tissue to the fascia.  The fascia was incised in the midline and the fascial incision was extended laterally with Mayo scissors. The superior aspect of the fascial incision was grasped with Coker clamps x2, tented up and the rectus muscles dissected off sharply with the scalpel. The rectus was then dissected off with blunt dissection and Mayo scissors inferiorly. The rectus muscles were separated in the midline. The abdominal peritoneum was identified, tented up, entered sharply, and the incision was extended superiorly and inferiorly with good visualization of the bladder. The Alexis retractor was then deployed. The vesicouterine peritoneum was identified, tented up, entered sharply with Metzenbaum scissors, and the bladder flap was created digitally. Scalpel was then used to make a low transverse incision on the uterus which was extended laterally with  blunt dissection. The fetal vertex was  identified, with help from a nurse pushing up on the head vaginally, the head was brought out from the pelvis. A Mity Vacuum was placed to aid in delivery of the vertex.  The head was then delivered easily through the uterine incision followed by the body. The A live female infant was bulb suctioned on the operative field cried vigorously, cord was clamped and cut and the infant was passed to the waiting neonatologist. Apgars 7/8. Placenta was then delivered spontaneously, intact and appear normal, the uterus was cleared of all clot and debris. The uterine incision was repaired with #0 Monocryl in running locked fashion. A second imbricating suture was performed using the same suture. There was a noted extension into the left broad ligament that was repaired with 0-monocryl figure of eight.  Ovaries and tubes were inspected and normal. The Alexis retractor was removed. The abdominal cavity was cleared of all clot and debris. The abdominal peritoneum was reapproximated with 2-0 chromic  in a running fashion, the rectus muscles was reapproximated with #2 chromic in interrupted fashion. The fascia was closed with 0 Vicryl in a running fashion. The subcuticular layer was irrigated and all bleeders cauterized.  Subcutaneous layer was reapproximated with 2-0 plain.   The skin was closed with 4-0 vicryl in a subcuticular fashion using a Mellody Dance needle. The incision was dressed with benzoine, steri strips and pressure dressing. All sponge lap and needle counts were correct x3. Patient tolerated the procedure well and recovered in stable condition following the procedure.  Instrument, sponge, and needle counts were correct prior the abdominal closure and at the conclusion of the case.   Findings: Live female infant, Apgars 7/8 clear amniotic fluid, normal  appearing placenta, normal uterus, bilateral tubes and ovaries  Estimated Blood Loss: 1200 mL         Drains: Foley catheter         Specimens: Placenta to L&D          Implants: none         Complications:  None; patient tolerated the procedure well.         Disposition: PACU - hemodynamically stable.   Traci Sweeney

## 2015-01-08 LAB — OB RESULTS CONSOLE GC/CHLAMYDIA
Chlamydia: NEGATIVE
Gonorrhea: NEGATIVE

## 2015-01-14 LAB — OB RESULTS CONSOLE HEPATITIS B SURFACE ANTIGEN: HEP B S AG: NEGATIVE

## 2015-01-14 LAB — OB RESULTS CONSOLE HIV ANTIBODY (ROUTINE TESTING): HIV: NONREACTIVE

## 2015-01-14 LAB — OB RESULTS CONSOLE RPR: RPR: NONREACTIVE

## 2015-01-14 LAB — OB RESULTS CONSOLE RUBELLA ANTIBODY, IGM: Rubella: IMMUNE

## 2015-05-04 ENCOUNTER — Inpatient Hospital Stay (HOSPITAL_COMMUNITY)
Admission: AD | Admit: 2015-05-04 | Discharge: 2015-05-04 | Disposition: A | Discharge status: Home / Self Care | Payer: BLUE CROSS/BLUE SHIELD | Source: Ambulatory Visit | Attending: Obstetrics and Gynecology | Admitting: Obstetrics and Gynecology

## 2015-05-04 ENCOUNTER — Encounter (HOSPITAL_COMMUNITY): Payer: Self-pay | Admitting: *Deleted

## 2015-05-04 ENCOUNTER — Emergency Department (HOSPITAL_COMMUNITY)
Admission: EM | Admit: 2015-05-04 | Discharge: 2015-05-04 | Disposition: A | Discharge status: Home / Self Care | Payer: BLUE CROSS/BLUE SHIELD | Attending: Emergency Medicine | Admitting: Emergency Medicine

## 2015-05-04 ENCOUNTER — Emergency Department (HOSPITAL_COMMUNITY): Payer: BLUE CROSS/BLUE SHIELD

## 2015-05-04 ENCOUNTER — Encounter (HOSPITAL_COMMUNITY): Payer: Self-pay | Admitting: Emergency Medicine

## 2015-05-04 DIAGNOSIS — Z3A20 20 weeks gestation of pregnancy: Secondary | ICD-10-CM | POA: Diagnosis not present

## 2015-05-04 DIAGNOSIS — Z8719 Personal history of other diseases of the digestive system: Secondary | ICD-10-CM | POA: Diagnosis not present

## 2015-05-04 DIAGNOSIS — Z87891 Personal history of nicotine dependence: Secondary | ICD-10-CM | POA: Diagnosis not present

## 2015-05-04 DIAGNOSIS — O26832 Pregnancy related renal disease, second trimester: Secondary | ICD-10-CM | POA: Diagnosis not present

## 2015-05-04 DIAGNOSIS — Z8619 Personal history of other infectious and parasitic diseases: Secondary | ICD-10-CM | POA: Diagnosis not present

## 2015-05-04 DIAGNOSIS — M549 Dorsalgia, unspecified: Secondary | ICD-10-CM | POA: Diagnosis present

## 2015-05-04 DIAGNOSIS — Z3A27 27 weeks gestation of pregnancy: Secondary | ICD-10-CM | POA: Diagnosis not present

## 2015-05-04 DIAGNOSIS — Z79899 Other long term (current) drug therapy: Secondary | ICD-10-CM | POA: Insufficient documentation

## 2015-05-04 DIAGNOSIS — O2342 Unspecified infection of urinary tract in pregnancy, second trimester: Secondary | ICD-10-CM | POA: Diagnosis not present

## 2015-05-04 DIAGNOSIS — N23 Unspecified renal colic: Secondary | ICD-10-CM | POA: Diagnosis not present

## 2015-05-04 DIAGNOSIS — O210 Mild hyperemesis gravidarum: Secondary | ICD-10-CM | POA: Diagnosis present

## 2015-05-04 LAB — CBC
HCT: 33 % — ABNORMAL LOW (ref 36.0–46.0)
HCT: 31 % — ABNORMAL LOW (ref 36.0–46.0)
HEMOGLOBIN: 10.5 g/dL — AB (ref 12.0–15.0)
Hemoglobin: 11.1 g/dL — ABNORMAL LOW (ref 12.0–15.0)
MCH: 29.6 pg (ref 26.0–34.0)
MCH: 30.3 pg (ref 26.0–34.0)
MCHC: 33.6 g/dL (ref 30.0–36.0)
MCHC: 33.9 g/dL (ref 30.0–36.0)
MCV: 88 fL (ref 78.0–100.0)
MCV: 89.6 fL (ref 78.0–100.0)
PLATELETS: 214 10*3/uL (ref 150–400)
Platelets: 192 10*3/uL (ref 150–400)
RBC: 3.75 MIL/uL — ABNORMAL LOW (ref 3.87–5.11)
RBC: 3.46 MIL/uL — AB (ref 3.87–5.11)
RDW: 13.2 % (ref 11.5–15.5)
RDW: 13.5 % (ref 11.5–15.5)
WBC: 12 10*3/uL — ABNORMAL HIGH (ref 4.0–10.5)
WBC: 11.7 10*3/uL — AB (ref 4.0–10.5)

## 2015-05-04 LAB — URINALYSIS, ROUTINE W REFLEX MICROSCOPIC
Bilirubin Urine: NEGATIVE
Bilirubin Urine: NEGATIVE
Glucose, UA: NEGATIVE mg/dL
Glucose, UA: NEGATIVE mg/dL
Ketones, ur: 15 mg/dL — AB
Ketones, ur: NEGATIVE mg/dL
LEUKOCYTES UA: NEGATIVE
Leukocytes, UA: NEGATIVE
Nitrite: NEGATIVE
Nitrite: NEGATIVE
PH: 7 (ref 5.0–8.0)
PROTEIN: NEGATIVE mg/dL
Protein, ur: NEGATIVE mg/dL
SPECIFIC GRAVITY, URINE: 1.019 (ref 1.005–1.030)
Specific Gravity, Urine: 1.015 (ref 1.005–1.030)
UROBILINOGEN UA: 0.2 mg/dL (ref 0.0–1.0)
Urobilinogen, UA: 0.2 mg/dL (ref 0.0–1.0)
pH: 6.5 (ref 5.0–8.0)

## 2015-05-04 LAB — URINE MICROSCOPIC-ADD ON

## 2015-05-04 LAB — BASIC METABOLIC PANEL
Anion gap: 6 (ref 5–15)
BUN: 8 mg/dL (ref 6–20)
CALCIUM: 8.4 mg/dL — AB (ref 8.9–10.3)
CO2: 23 mmol/L (ref 22–32)
Chloride: 107 mmol/L (ref 101–111)
Creatinine, Ser: 0.73 mg/dL (ref 0.44–1.00)
GFR calc Af Amer: >60 mL/min (ref >60)
GFR calc non Af Amer: >60 mL/min (ref >60)
GLUCOSE: 111 mg/dL — AB (ref 65–99)
Potassium: 3.4 mmol/L — ABNORMAL LOW (ref 3.5–5.1)
SODIUM: 136 mmol/L (ref 135–145)

## 2015-05-04 LAB — OB RESULTS CONSOLE GBS: GBS: NEGATIVE

## 2015-05-04 MED ORDER — LACTATED RINGERS IV BOLUS (SEPSIS): 1000.0000 mL | Freq: Once | INTRAVENOUS | Status: DC

## 2015-05-04 MED ORDER — OXYCODONE-ACETAMINOPHEN 5-325 MG PO TABS: 1.0000 | ORAL_TABLET | ORAL | Status: DC | PRN

## 2015-05-04 MED ORDER — ONDANSETRON HCL 4 MG/2ML IJ SOLN
4.0000 mg | Freq: Once | INTRAMUSCULAR | Status: AC
  Administered 2015-05-04: 4 mg via INTRAVENOUS
  Filled 2015-05-04: qty 2

## 2015-05-04 MED ORDER — MORPHINE SULFATE 4 MG/ML IJ SOLN
4.0000 mg | Freq: Once | INTRAMUSCULAR | Status: AC
  Administered 2015-05-04: 4 mg via INTRAVENOUS
  Filled 2015-05-04: qty 1

## 2015-05-04 MED ORDER — ONDANSETRON 8 MG PO TBDP: 8.0000 mg | ORAL_TABLET | Freq: Three times a day (TID) | ORAL | Status: DC | PRN

## 2015-05-04 MED ORDER — SODIUM CHLORIDE 0.9 % IV BOLUS (SEPSIS)
1000.0000 mL | Freq: Once | INTRAVENOUS | Status: AC
  Administered 2015-05-04: 1000 mL via INTRAVENOUS

## 2015-05-04 MED ORDER — CEFTRIAXONE SODIUM 1 G IJ SOLR
1.0000 g | Freq: Once | INTRAMUSCULAR | Status: AC
  Administered 2015-05-04: 1 g via INTRAMUSCULAR
  Filled 2015-05-04: qty 10

## 2015-05-04 NOTE — ED Notes (Signed)
Family at bedside. 

## 2015-05-04 NOTE — Discharge Instructions (Signed)
Ureteral Colic (Kidney Stones) °Ureteral colic is the result of a condition when kidney stones form inside the kidney. Once kidney stones are formed they may move into the tube that connects the kidney with the bladder (ureter). If this occurs, this condition may cause pain (colic) in the ureter.  °CAUSES  °Pain is caused by stone movement in the ureter and the obstruction caused by the stone. °SYMPTOMS  °The pain comes and goes as the ureter contracts around the stone. The pain is usually intense, sharp, and stabbing in character. The location of the pain may move as the stone moves through the ureter. When the stone is near the kidney the pain is usually located in the back and radiates to the belly (abdomen). When the stone is ready to pass into the bladder the pain is often located in the lower abdomen on the side the stone is located. At this location, the symptoms may mimic those of a urinary tract infection with urinary frequency. Once the stone is located here it often passes into the bladder and the pain disappears completely. °TREATMENT  °· Your caregiver will provide you with medicine for pain relief. °· You may require specialized follow-up X-rays. °· The absence of pain does not always mean that the stone has passed. It may have just stopped moving. If the urine remains completely obstructed, it can cause loss of kidney function or even complete destruction of the involved kidney. It is your responsibility and in your interest that X-rays and follow-ups as suggested by your caregiver are completed. Relief of pain without passage of the stone can be associated with severe damage to the kidney, including loss of kidney function on that side. °· If your stone does not pass on its own, additional measures may be taken by your caregiver to ensure its removal. °HOME CARE INSTRUCTIONS  °· Increase your fluid intake. Water is the preferred fluid since juices containing vitamin C may acidify the urine making it  less likely for certain stones (uric acid stones) to pass. °· Strain all urine. A strainer will be provided. Keep all particulate matter or stones for your caregiver to inspect. °· Take your pain medicine as directed. °· Make a follow-up appointment with your caregiver as directed. °· Remember that the goal is passage of your stone. The absence of pain does not mean the stone is gone. Follow your caregiver's instructions. °· Only take over-the-counter or prescription medicines for pain, discomfort, or fever as directed by your caregiver. °SEEK MEDICAL CARE IF:  °· Pain cannot be controlled with the prescribed medicine. °· You have a fever. °· Pain continues for longer than your caregiver advises it should. °· There is a change in the pain, and you develop chest discomfort or constant abdominal pain. °· You feel faint or pass out. °MAKE SURE YOU:  °· Understand these instructions. °· Will watch your condition. °· Will get help right away if you are not doing well or get worse. °Document Released: 07/01/2005 Document Revised: 01/16/2013 Document Reviewed: 03/18/2011 °ExitCare® Patient Information ©2015 [employer]. This information is not intended to replace advice given to you by your health care provider. Make sure you discuss any questions you have with your health care provider. ° °

## 2015-05-04 NOTE — MAU Provider Note (Signed)
Chief Complaint:  Fever and Back Pain   None     HPI: Traci Sweeney is a 29 y.o. G1P0 at 41w5dwho presents to maternity admissions reporting back pain and onset of fever.  She presented to [facility] this morning after onset of severe left lower back pain and n/v.  She was discharged from the ED with diagnosis of kidney stones and given nausea and pain medication.  She returns to MAU  Because this afternoon she felt chills and took her temperature and it was 99.8 initially.  During the following hours, she took her temperature with the following results:  3:00 99.8 5:45 100.3 6:15 101.8 6:35 101.4 7:00 102. 0 7:15 101.8 She took Tylenol 650 mg around 5:45 with no reduction in temperature.  She has not taken pain medication or nausea medication prescribed earlier today and denies severe pain or nausea at present.  She called Dr Dareen Piano with her temperature results and was told to come to MAU for evaluation.  She reports the back pain she had this morning is much better, but she still has aching pain with movement.   She reports good fetal movement, denies LOF, vaginal bleeding, vaginal itching/burning, urinary symptoms, h/a, dizziness, or n/v.   Fever  This is a new problem. The current episode started today. The problem occurs intermittently. The problem has been resolved. The maximum temperature noted was 102 to 102.9 F. The temperature was taken using an oral thermometer. Associated symptoms include nausea. Pertinent negatives include no abdominal pain, chest pain, congestion, coughing, diarrhea, headaches, muscle aches, sore throat, urinary pain or vomiting. She has tried acetaminophen for the symptoms. The treatment provided no relief.  Back Pain This is a new problem. The current episode started today. The problem occurs constantly. The problem has been gradually improving since onset. The quality of the pain is described as aching. The pain does not radiate. The pain is mild. The symptoms are  aggravated by coughing, bending, standing and twisting. Associated symptoms include a fever. Pertinent negatives include no abdominal pain, chest pain, dysuria or headaches. She has tried analgesics and heat for the symptoms. The treatment provided significant relief.    Past Medical History: Past Medical History  Diagnosis Date  . HSV-1 infection   . Inguinal hernia infant    bilateral  . UTI (lower urinary tract infection) 2006 - 2007     Dr. Sherron Monday uro eval seconday to frequent infection with negative cysto    Past obstetric history: OB History  Gravida Para Term Preterm AB SAB TAB Ectopic Multiple Living  1 0            # Outcome Date GA Lbr Len/2nd Weight Sex Delivery Anes PTL Lv  1 Current               Past Surgical History: Past Surgical History  Procedure Laterality Date  . Inguinal hernia repair Bilateral infant  . Tonsillectomy  age 67  . Myringotomy  2005  . Cystoscopy  10/2005    negative  . Tympanoplasty  2006    Family History: Family History  Problem Relation Age of Onset  . Lung cancer Paternal Grandfather     deceased  . Diabetes Father   . Heart attack Paternal Grandfather 26    deceased  . Hypertension Mother   . Thyroid disease Mother   . Heart disease Mother   . Hypertension Brother   . Lymphoma Paternal Uncle     deceased  . Lung  cancer Maternal Grandfather     deceased    Social History: History  Substance Use Topics  . Smoking status: Former Smoker -- 0.50 packs/day for 8 years    Types: Cigarettes    Quit date: 08/05/2011  . Smokeless tobacco: Never Used  . Alcohol Use: Yes    Allergies: No Known Allergies  Meds:  Prescriptions prior to admission  Medication Sig Dispense Refill Last Dose  . acetaminophen (TYLENOL) 500 MG tablet Take 1,000 mg by mouth every 6 (six) hours as needed for fever.   05/04/2015 at 1745  . cetirizine (ZYRTEC) 10 MG tablet Take 10 mg by mouth daily as needed for allergies.   05/03/2015 at Unknown  time  . ondansetron (ZOFRAN ODT) 8 MG disintegrating tablet Take 1 tablet (8 mg total) by mouth every 8 (eight) hours as needed for nausea or vomiting. 12 tablet 0 PRN at PRN  . oxyCODONE-acetaminophen (PERCOCET/ROXICET) 5-325 MG per tablet Take 1 tablet by mouth every 4 (four) hours as needed for severe pain. 15 tablet 0 PRN at PRN  . Prenatal Vit-Fe Fumarate-FA (PRENATAL MULTIVITAMIN) TABS tablet Take 1 tablet by mouth at bedtime.    05/03/2015 at Unknown time    Review of Systems  Constitutional: Positive for fever. Negative for chills and malaise/fatigue.  HENT: Negative for congestion and sore throat.   Eyes: Negative for blurred vision.  Respiratory: Negative for cough and shortness of breath.   Cardiovascular: Negative for chest pain.  Gastrointestinal: Positive for nausea. Negative for heartburn, vomiting, abdominal pain and diarrhea.  Genitourinary: Negative for dysuria, urgency and frequency.  Musculoskeletal: Positive for back pain.  Neurological: Negative for dizziness and headaches.  Psychiatric/Behavioral: Negative for depression.    Physical Exam  Blood pressure 110/64, pulse 88, temperature 98.1 F (36.7 C), temperature source Oral, resp. rate 16, height  (1.626 m), weight 84.369 kg (186 lb), last menstrual period 07/28/2014. GENERAL: Well-developed, well-nourished female in no acute distress.  EYES: normal sclera/conjunctiva; no lid-lag HENT: Atraumatic, normocephalic HEART: normal rate RESP: normal effort ABDOMEN: Soft, non-tender MUSCULOSKELETAL: Normal ROM, Positive CVA tenderness on left EXTREMITIES: Nontender, no edema NEURO/PSYCH: Alert and oriented, appropriate affect  GU: PELVIC EXAM: Deferred     FHT:  Baseline 150 , moderate variability, accelerations present, no decelerations Contractions: None on toco or to palpation   Labs: Results for orders placed or performed during the hospital encounter of 05/04/15 (from the past 24 hour(s))  Urinalysis,  Routine w reflex microscopic (not at [facility])     Status: Abnormal   Collection Time: 05/04/15  8:13 PM  Result Value Ref Range   Color, Urine YELLOW YELLOW   APPearance CLEAR CLEAR   Specific Gravity, Urine 1.015 1.005 - 1.030   pH 7.0 5.0 - 8.0   Glucose, UA NEGATIVE NEGATIVE mg/dL   Hgb urine dipstick MODERATE (A) NEGATIVE   Bilirubin Urine NEGATIVE NEGATIVE   Ketones, ur NEGATIVE NEGATIVE mg/dL   Protein, ur NEGATIVE NEGATIVE mg/dL   Urobilinogen, UA 0.2 0.0 - 1.0 mg/dL   Nitrite NEGATIVE NEGATIVE   Leukocytes, UA NEGATIVE NEGATIVE  Urine microscopic-add on     Status: None   Collection Time: 05/04/15  8:13 PM  Result Value Ref Range   Squamous Epithelial / LPF RARE RARE   WBC, UA 3-6 <3 WBC/hpf   RBC / HPF 0-2 <3 RBC/hpf   Bacteria, UA RARE RARE   Urine-Other MUCOUS PRESENT   CBC     Status: Abnormal   Collection Time:  05/04/15  9:30 PM  Result Value Ref Range   WBC 11.7 (H) 4.0 - 10.5 K/uL   RBC 3.46 (L) 3.87 - 5.11 MIL/uL   Hemoglobin 10.5 (L) 12.0 - 15.0 g/dL   HCT 16.1 (L) 09.6 - 04.5 %   MCV 89.6 78.0 - 100.0 fL   MCH 30.3 26.0 - 34.0 pg   MCHC 33.9 30.0 - 36.0 g/dL   RDW 40.9 81.1 - 91.4 %   Platelets 192 150 - 400 K/uL    Imaging:  US Renal  05/04/2015   CLINICAL DATA:  Severe, acute left flank pain with radiation towards left abdomen, [redacted] weeks pregnant  EXAM: RENAL / URINARY TRACT ULTRASOUND COMPLETE  COMPARISON:  CT abdomen pelvis dated 10/29/2005  FINDINGS: Right Kidney:  Length: 12.2 cm.  No mass or hydronephrosis.  Left Kidney:  Length: 12.8 cm.  Mild pelvic fullness/hydronephrosis.  Bladder:  Within normal limits. Right bladder jet is demonstrated. No definite left bladder jet.  No calculus is visualized at the left UVJ. A dilated distal left ureter is not observed.  IMPRESSION: Mild left pelvic fullness/ hydronephrosis. This may be secondary to extrinsic compression from the gravid uterus. However, a nonvisualized left ureteral calculus is not excluded.    Electronically Signed   By: Charline Bills M.D.   On: 05/04/2015 07:29   ED Course Reviewed labs, U/S, assessment from [facility].  Following assessment of pt and review of FHR tracing in MAU, consulted Dr Dareen Piano.  Plan to obtain new CBC.  WBC stable at 11.7.  Pt afebrile in MAU. Rocephin 1000 mg IM x 1 dose in MAU. Pt to call office if symptoms persist or worsen.  Pt stable at time of d/c.   Assessment: 1. UTI in pregnancy, antepartum, second trimester     Plan: Discharge home PTL precautions and fetal kick counts      Follow-up Information    Follow up with Levi Aland, MD.   Specialty:  Obstetrics and Gynecology   Why:  As scheduled or call if symptoms persist or worsen   Contact information:   [address] [place] [address] [phone]       Follow up with [facility] [place] [facility] ADMISSIONS.   Why:  As needed for emergencies   Contact information:   [address] [id number] mc [place] [address] [phone]       Medication List    TAKE these medications        acetaminophen 500 MG tablet  Commonly known as:  TYLENOL  Take 1,000 mg by mouth every 6 (six) hours as needed for fever.     cetirizine 10 MG tablet  Commonly known as:  ZYRTEC  Take 10 mg by mouth daily as needed for allergies.     ondansetron 8 MG disintegrating tablet  Commonly known as:  ZOFRAN ODT  Take 1 tablet (8 mg total) by mouth every 8 (eight) hours as needed for nausea or vomiting.     oxyCODONE-acetaminophen 5-325 MG per tablet  Commonly known as:  PERCOCET/ROXICET  Take 1 tablet by mouth every 4 (four) hours as needed for severe pain.     prenatal multivitamin Tabs tablet  Take 1 tablet by mouth at bedtime.        Sharen Counter Certified Nurse-Midwife 05/04/2015 10:33 PM

## 2015-05-04 NOTE — ED Notes (Signed)
Rapid OB at the bedside at this time.

## 2015-05-04 NOTE — ED Notes (Signed)
Pt. reports left lower back pain with emesis onset this morning , denies dysuria , no fever or chills, pt. stated she is [redacted] weeks pregnant (G1P0) .

## 2015-05-04 NOTE — Discharge Instructions (Signed)
Pregnancy and Urinary Tract Infection °A urinary tract infection (UTI) is a bacterial infection of the urinary tract. Infection of the urinary tract can include the ureters, kidneys (pyelonephritis), bladder (cystitis), and urethra (urethritis). All pregnant women should be screened for bacteria in the urinary tract. Identifying and treating a UTI will decrease the risk of preterm labor and developing more serious infections in both the mother and baby. °CAUSES °Bacteria germs cause almost all UTIs.  °RISK FACTORS °Many factors can increase your chances of getting a UTI during pregnancy. These include: °· Having a short urethra. °· Poor toilet and hygiene habits. °· Sexual intercourse. °· Blockage of urine along the urinary tract. °· Problems with the pelvic muscles or nerves. °· Diabetes. °· Obesity. °· Bladder problems after having several children. °· Previous history of UTI. °SIGNS AND SYMPTOMS  °· Pain, burning, or a stinging feeling when urinating. °· Suddenly feeling the need to urinate right away (urgency). °· Loss of bladder control (urinary incontinence). °· Frequent urination, more than is common with pregnancy. °· Lower abdominal or back discomfort. °· Cloudy urine. °· Blood in the urine (hematuria). °· Fever.  °When the kidneys are infected, the symptoms may be: °· Back pain. °· Flank pain on the right side more so than the left. °· Fever. °· Chills. °· Nausea. °· Vomiting. °DIAGNOSIS  °A urinary tract infection is usually diagnosed through urine tests. Additional tests and procedures are sometimes done. These may include: °· Ultrasound exam of the kidneys, ureters, bladder, and urethra. °· Looking in the bladder with a lighted tube (cystoscopy). °TREATMENT °Typically, UTIs can be treated with antibiotic medicines.  °HOME CARE INSTRUCTIONS  °· Only take over-the-counter or prescription medicines as directed by your health care provider. If you were prescribed antibiotics, take them as directed. Finish  them even if you start to feel better. °· Drink enough fluids to keep your urine clear or pale yellow. °· Do not have sexual intercourse until the infection is gone and your health care provider says it is okay. °· Make sure you are tested for UTIs throughout your pregnancy. These infections often come back.  °Preventing a UTI in the Future °· Practice good toilet habits. Always wipe from front to back. Use the tissue only once. °· Do not hold your urine. Empty your bladder as soon as possible when the urge comes. °· Do not douche or use deodorant sprays. °· Wash with soap and warm water around the genital area and the anus. °· Empty your bladder before and after sexual intercourse. °· Wear underwear with a cotton crotch. °· Avoid caffeine and carbonated drinks. They can irritate the bladder. °· Drink cranberry juice or take cranberry pills. This may decrease the risk of getting a UTI. °· Do not drink alcohol. °· Keep all your appointments and tests as scheduled.  °SEEK MEDICAL CARE IF:  °· Your symptoms get worse. °· You are still having fevers 2 or more days after treatment begins. °· You have a rash. °· You feel that you are having problems with medicines prescribed. °· You have abnormal vaginal discharge. °SEEK IMMEDIATE MEDICAL CARE IF:  °· You have back or flank pain. °· You have chills. °· You have blood in your urine. °· You have nausea and vomiting. °· You have contractions of your uterus. °· You have a gush of fluid from the vagina. °MAKE SURE YOU: °· Understand these instructions.   °· Will watch your condition.   °· Will get help right away if you are not doing   well or get worse.   °Document Released: 01/16/2011 Document Revised: 07/12/2013 Document Reviewed: 04/20/2013 °Exit[employer]® Patient Information ©2015 [employer]. This information is not intended to replace advice given to you by your health care provider. Make sure you discuss any questions you have with your health care provider. ° °

## 2015-05-04 NOTE — Progress Notes (Signed)
Renal Ultrasound done. EFM applied.

## 2015-05-04 NOTE — ED Provider Notes (Signed)
CSN: [id number]     Arrival date & time 05/04/15  0503 History   First MD Initiated Contact with Patient 05/04/15 [phone]     Chief Complaint  Patient presents with  . Back Pain  . Emesis During Pregnancy      The history is provided by the patient.   patient is a G1 P0 currently [redacted] weeks pregnant.  She's had a uncomplicated pregnancy thus far.  She is followed by a local OB/GYN.  She's had normal prenatal care.  She presents with acute onset left flank pain radiating towards the left groin.  She has a history of kidney stones.  She denies dysuria or urinary frequency.  She's had nausea and vomiting this morning as well.  She denies vaginal bleeding.  No loss of vaginal fluid.  She still feels her baby moving.  She called her OB/GYN who recommended evaluation emergency department.  Her pain is moderate to severe in severity at this time.  Nothing worsens or improves her pain.  Her pain is colicky.  Past Medical History  Diagnosis Date  . HSV-1 infection   . Inguinal hernia infant    bilateral  . UTI (lower urinary tract infection) 2006 - 2007     Dr. Sherron Monday uro eval seconday to frequent infection with negative cysto   Past Surgical History  Procedure Laterality Date  . Inguinal hernia repair Bilateral infant  . Tonsillectomy  age 4  . Myringotomy  2005  . Cystoscopy  10/2005    negative  . Tympanoplasty  2006   Family History  Problem Relation Age of Onset  . Lung cancer Paternal Grandfather     deceased  . Diabetes Father   . Heart attack Paternal Grandfather 73    deceased  . Hypertension Mother   . Thyroid disease Mother   . Heart disease Mother   . Hypertension Brother   . Lymphoma Paternal Uncle     deceased  . Lung cancer Maternal Grandfather     deceased   History  Substance Use Topics  . Smoking status: Former Smoker -- 0.50 packs/day for 8 years    Types: Cigarettes    Quit date: 08/05/2011  . Smokeless tobacco: Never Used  . Alcohol Use: Yes   OB  History    Gravida Para Term Preterm AB TAB SAB Ectopic Multiple Living   1 0             Review of Systems  All other systems reviewed and are negative.     Allergies  Review of patient's allergies indicates no known allergies.  Home Medications   Prior to Admission medications   Medication Sig Start Date End Date Taking? Authorizing Provider  Multiple Vitamins-Minerals (HAIR/SKIN/NAILS PO) Take 1 tablet by mouth daily.    Historical Provider, MD   BP 117/65 mmHg  Pulse 95  Temp(Src) 97.8 F (36.6 C) (Oral)  Resp 16  Ht 5\' 4"  (1.626 m)  Wt 186 lb (84.369 kg)  BMI 31.91 kg/m2  SpO2 97%  LMP 07/28/2014 Physical Exam  Constitutional: She is oriented to person, place, and time. She appears well-developed and well-nourished.  Uncomfortable appearing  HENT:  Head: Normocephalic and atraumatic.  Eyes: EOM are normal.  Neck: Normal range of motion.  Cardiovascular: Normal rate, regular rhythm and normal heart sounds.   Pulmonary/Chest: Effort normal and breath sounds normal.  Abdominal: Soft. She exhibits no distension. There is no tenderness.  Gravid uterus consistent with dates  Musculoskeletal: Normal  range of motion.  Neurological: She is alert and oriented to person, place, and time.  Skin: Skin is warm and dry.  Psychiatric: She has a normal mood and affect. Judgment normal.  Nursing note and vitals reviewed.   ED Course  Procedures (including critical care time) Labs Review Labs Reviewed  URINALYSIS, ROUTINE W REFLEX MICROSCOPIC (NOT AT [facility]) - Abnormal; Notable for the following:    APPearance HAZY (*)    Hgb urine dipstick LARGE (*)    Ketones, ur 15 (*)    All other components within normal limits  CBC - Abnormal; Notable for the following:    WBC 12.0 (*)    RBC 3.75 (*)    Hemoglobin 11.1 (*)    HCT 33.0 (*)    All other components within normal limits  BASIC METABOLIC PANEL - Abnormal; Notable for the following:    Potassium 3.4 (*)    Glucose,  Bld 111 (*)    Calcium 8.4 (*)    All other components within normal limits  URINE MICROSCOPIC-ADD ON - Abnormal; Notable for the following:    Squamous Epithelial / LPF FEW (*)    Bacteria, UA FEW (*)    All other components within normal limits    Imaging Review US Renal  05/04/2015   CLINICAL DATA:  Severe, acute left flank pain with radiation towards left abdomen, [redacted] weeks pregnant  EXAM: RENAL / URINARY TRACT ULTRASOUND COMPLETE  COMPARISON:  CT abdomen pelvis dated 10/29/2005  FINDINGS: Right Kidney:  Length: 12.2 cm.  No mass or hydronephrosis.  Left Kidney:  Length: 12.8 cm.  Mild pelvic fullness/hydronephrosis.  Bladder:  Within normal limits. Right bladder jet is demonstrated. No definite left bladder jet.  No calculus is visualized at the left UVJ. A dilated distal left ureter is not observed.  IMPRESSION: Mild left pelvic fullness/ hydronephrosis. This may be secondary to extrinsic compression from the gravid uterus. However, a nonvisualized left ureteral calculus is not excluded.   Electronically Signed   By: Charline Bills M.D.   On: 05/04/2015 07:29  I personally reviewed the imaging tests through PACS system I reviewed available ER/hospitalization records through the EMR    EKG Interpretation None      MDM   Final diagnoses:  Ureteral colic    Patient's pain is significantly improved.  Rapid response of B cane.  Patient's baby looks good.  She's had no organized contractions.  This does not appear to be a presentation of preterm labor.  Given a left-sided hydronephrosis or suspect this is the patient passing a left-sided kidney stone.  Patient be referred back to her OB/GYN.  Strict stone precautions given.    Azalia Bilis, MD 05/04/15 [phone]

## 2015-05-04 NOTE — Progress Notes (Signed)
Dr. Dareen Piano notified that the pt has a kidney stone and will be d/c home with pain meds. FHR tracing is a category 1 No decels, no vaginal bleeding or leaking of fluid. 1-2 small uc's noted on tracing. He is agreeable with the plan of care.

## 2015-05-04 NOTE — ED Notes (Signed)
Dr. campos at the bedside.  

## 2015-05-04 NOTE — ED Notes (Signed)
Rapid OB contacted but stuck at [facility] at this time.

## 2015-05-04 NOTE — Progress Notes (Signed)
Dr. Patria Mane in. Says the results of the renal ultrasound shows that the pt has a kidney stone. She is to drink plenty of fluids and will be dc'd home with prescription for pain meds. Pt is to return to the [facility] if her pain gets worse. Pt verbalizes understanding.

## 2015-05-04 NOTE — MAU Note (Signed)
Patient presents at [redacted] weeks gestation stating that she went to [facility] this morning with c/o lower left back pain and was told that she had a kidney stone and to strain all urine and discharged. Around 1500, the patient started running a fever and came to MAU. Fetus active. Denies bleeding or discharge.

## 2015-05-06 LAB — CULTURE, OB URINE

## 2015-07-15 ENCOUNTER — Inpatient Hospital Stay (HOSPITAL_COMMUNITY)
Admission: AD | Admit: 2015-07-15 | Discharge: 2015-07-20 | DRG: 765 | Disposition: A | Discharge status: Home / Self Care | Payer: BLUE CROSS/BLUE SHIELD | Source: Ambulatory Visit | Attending: Obstetrics & Gynecology | Admitting: Obstetrics & Gynecology

## 2015-07-15 ENCOUNTER — Encounter (HOSPITAL_COMMUNITY): Payer: Self-pay | Admitting: *Deleted

## 2015-07-15 DIAGNOSIS — D62 Acute posthemorrhagic anemia: Secondary | ICD-10-CM | POA: Diagnosis not present

## 2015-07-15 DIAGNOSIS — Z87891 Personal history of nicotine dependence: Secondary | ICD-10-CM

## 2015-07-15 DIAGNOSIS — O133 Gestational [pregnancy-induced] hypertension without significant proteinuria, third trimester: Secondary | ICD-10-CM

## 2015-07-15 DIAGNOSIS — E669 Obesity, unspecified: Secondary | ICD-10-CM | POA: Diagnosis present

## 2015-07-15 DIAGNOSIS — K219 Gastro-esophageal reflux disease without esophagitis: Secondary | ICD-10-CM | POA: Diagnosis present

## 2015-07-15 DIAGNOSIS — O139 Gestational [pregnancy-induced] hypertension without significant proteinuria, unspecified trimester: Secondary | ICD-10-CM | POA: Diagnosis present

## 2015-07-15 DIAGNOSIS — O1494 Unspecified pre-eclampsia, complicating childbirth: Secondary | ICD-10-CM | POA: Diagnosis present

## 2015-07-15 DIAGNOSIS — D649 Anemia, unspecified: Secondary | ICD-10-CM | POA: Diagnosis present

## 2015-07-15 DIAGNOSIS — O98313 Other infections with a predominantly sexual mode of transmission complicating pregnancy, third trimester: Secondary | ICD-10-CM | POA: Diagnosis present

## 2015-07-15 DIAGNOSIS — O9962 Diseases of the digestive system complicating childbirth: Secondary | ICD-10-CM | POA: Diagnosis present

## 2015-07-15 DIAGNOSIS — Z6834 Body mass index (BMI) 34.0-34.9, adult: Secondary | ICD-10-CM

## 2015-07-15 DIAGNOSIS — O134 Gestational [pregnancy-induced] hypertension without significant proteinuria, complicating childbirth: Principal | ICD-10-CM | POA: Diagnosis present

## 2015-07-15 DIAGNOSIS — Z3A38 38 weeks gestation of pregnancy: Secondary | ICD-10-CM

## 2015-07-15 DIAGNOSIS — O9902 Anemia complicating childbirth: Secondary | ICD-10-CM | POA: Diagnosis present

## 2015-07-15 DIAGNOSIS — Z98891 History of uterine scar from previous surgery: Secondary | ICD-10-CM

## 2015-07-15 DIAGNOSIS — O164 Unspecified maternal hypertension, complicating childbirth: Secondary | ICD-10-CM | POA: Diagnosis present

## 2015-07-15 DIAGNOSIS — O99214 Obesity complicating childbirth: Secondary | ICD-10-CM | POA: Diagnosis present

## 2015-07-15 LAB — LACTATE DEHYDROGENASE: LDH: 124 U/L (ref 98–192)

## 2015-07-15 LAB — URINALYSIS, ROUTINE W REFLEX MICROSCOPIC
BILIRUBIN URINE: NEGATIVE
Glucose, UA: NEGATIVE mg/dL
Ketones, ur: NEGATIVE mg/dL
Leukocytes, UA: NEGATIVE
NITRITE: NEGATIVE
PH: 7 (ref 5.0–8.0)
Protein, ur: NEGATIVE mg/dL
SPECIFIC GRAVITY, URINE: 1.01 (ref 1.005–1.030)
Urobilinogen, UA: 0.2 mg/dL (ref 0.0–1.0)

## 2015-07-15 LAB — TYPE AND SCREEN
ABO/RH(D): O POS
ANTIBODY SCREEN: NEGATIVE

## 2015-07-15 LAB — CBC WITH DIFFERENTIAL/PLATELET
Basophils Absolute: 0 10*3/uL (ref 0.0–0.1)
Basophils Relative: 0 %
EOS PCT: 0 %
Eosinophils Absolute: 0 10*3/uL (ref 0.0–0.7)
HEMATOCRIT: 33.1 % — AB (ref 36.0–46.0)
Hemoglobin: 11.3 g/dL — ABNORMAL LOW (ref 12.0–15.0)
LYMPHS ABS: 1.9 10*3/uL (ref 0.7–4.0)
LYMPHS PCT: 21 %
MCH: 29.9 pg (ref 26.0–34.0)
MCHC: 34.1 g/dL (ref 30.0–36.0)
MCV: 87.6 fL (ref 78.0–100.0)
Monocytes Absolute: 0.6 10*3/uL (ref 0.1–1.0)
Monocytes Relative: 7 %
NEUTROS PCT: 72 %
Neutro Abs: 6.5 10*3/uL (ref 1.7–7.7)
Platelets: 163 10*3/uL (ref 150–400)
RBC: 3.78 MIL/uL — AB (ref 3.87–5.11)
RDW: 14.1 % (ref 11.5–15.5)
WBC: 9 10*3/uL (ref 4.0–10.5)

## 2015-07-15 LAB — COMPREHENSIVE METABOLIC PANEL
ALK PHOS: 148 U/L — AB (ref 38–126)
ALT: 13 U/L — AB (ref 14–54)
AST: 19 U/L (ref 15–41)
Albumin: 3 g/dL — ABNORMAL LOW (ref 3.5–5.0)
Anion gap: 7 (ref 5–15)
BUN: 6 mg/dL (ref 6–20)
CALCIUM: 8.5 mg/dL — AB (ref 8.9–10.3)
CO2: 22 mmol/L (ref 22–32)
CREATININE: 0.55 mg/dL (ref 0.44–1.00)
Chloride: 107 mmol/L (ref 101–111)
GFR calc Af Amer: >60 mL/min (ref >60)
Glucose, Bld: 73 mg/dL (ref 65–99)
Potassium: 3.3 mmol/L — ABNORMAL LOW (ref 3.5–5.1)
Sodium: 136 mmol/L (ref 135–145)
Total Bilirubin: 0.5 mg/dL (ref 0.3–1.2)
Total Protein: 6.2 g/dL — ABNORMAL LOW (ref 6.5–8.1)

## 2015-07-15 LAB — PROTEIN / CREATININE RATIO, URINE
Creatinine, Urine: 22 mg/dL
Total Protein, Urine: <6 mg/dL

## 2015-07-15 LAB — URINE MICROSCOPIC-ADD ON

## 2015-07-15 LAB — ABO/RH: ABO/RH(D): O POS

## 2015-07-15 LAB — URIC ACID: URIC ACID, SERUM: 3.3 mg/dL (ref 2.3–6.6)

## 2015-07-15 MED ORDER — BUTORPHANOL TARTRATE 1 MG/ML IJ SOLN
1.0000 mg | INTRAMUSCULAR | Status: DC | PRN
  Administered 2015-07-16: 1 mg via INTRAVENOUS
  Filled 2015-07-15: qty 1

## 2015-07-15 MED ORDER — LIDOCAINE HCL (PF) 1 % IJ SOLN: 30.0000 mL | INTRAMUSCULAR | Status: DC | PRN

## 2015-07-15 MED ORDER — OXYCODONE-ACETAMINOPHEN 5-325 MG PO TABS: 1.0000 | ORAL_TABLET | ORAL | Status: DC | PRN

## 2015-07-15 MED ORDER — OXYTOCIN BOLUS FROM INFUSION: 500.0000 mL | INTRAVENOUS | Status: DC

## 2015-07-15 MED ORDER — ZOLPIDEM TARTRATE 5 MG PO TABS
5.0000 mg | ORAL_TABLET | Freq: Every evening | ORAL | Status: DC | PRN
  Administered 2015-07-16: 5 mg via ORAL
  Filled 2015-07-15: qty 1

## 2015-07-15 MED ORDER — CITRIC ACID-SODIUM CITRATE 334-500 MG/5ML PO SOLN
30.0000 mL | ORAL | Status: DC | PRN
  Administered 2015-07-16 – 2015-07-17 (×2): 30 mL via ORAL
  Filled 2015-07-15 (×2): qty 15

## 2015-07-15 MED ORDER — LACTATED RINGERS IV SOLN
INTRAVENOUS | Status: DC
  Administered 2015-07-15 – 2015-07-17 (×8): via INTRAVENOUS
  Administered 2015-07-17: 125 mL/h via INTRAVENOUS

## 2015-07-15 MED ORDER — ACETAMINOPHEN 325 MG PO TABS: 650.0000 mg | ORAL_TABLET | ORAL | Status: DC | PRN

## 2015-07-15 MED ORDER — OXYTOCIN 40 UNITS IN LACTATED RINGERS INFUSION - SIMPLE MED
62.5000 mL/h | INTRAVENOUS | Status: DC
  Filled 2015-07-15: qty 1000

## 2015-07-15 MED ORDER — MISOPROSTOL 25 MCG QUARTER TABLET
25.0000 ug | ORAL_TABLET | ORAL | Status: DC | PRN
  Administered 2015-07-15 (×2): 25 ug via VAGINAL
  Filled 2015-07-15 (×2): qty 0.25

## 2015-07-15 MED ORDER — ONDANSETRON HCL 4 MG/2ML IJ SOLN
4.0000 mg | Freq: Four times a day (QID) | INTRAMUSCULAR | Status: DC | PRN
  Administered 2015-07-17: 4 mg via INTRAVENOUS

## 2015-07-15 MED ORDER — LACTATED RINGERS IV SOLN
500.0000 mL | INTRAVENOUS | Status: DC | PRN
  Administered 2015-07-16 – 2015-07-17 (×2): 500 mL via INTRAVENOUS

## 2015-07-15 MED ORDER — OXYCODONE-ACETAMINOPHEN 5-325 MG PO TABS: 2.0000 | ORAL_TABLET | ORAL | Status: DC | PRN

## 2015-07-15 MED ORDER — HYDRALAZINE HCL 20 MG/ML IJ SOLN: 10.0000 mg | Freq: Once | INTRAMUSCULAR | Status: DC | PRN

## 2015-07-15 MED ORDER — LABETALOL HCL 5 MG/ML IV SOLN: 20.0000 mg | INTRAVENOUS | Status: DC | PRN

## 2015-07-15 MED ORDER — TERBUTALINE SULFATE 1 MG/ML IJ SOLN: 0.2500 mg | Freq: Once | INTRAMUSCULAR | Status: DC | PRN

## 2015-07-15 NOTE — MAU Note (Signed)
Pt presents to MAU with complaints of high blood pressure, states she has not been feeling well for about a week and took her blood pressure and it was elevated. Dr Tenny Craw told pt to come in and be evaluated.

## 2015-07-15 NOTE — H&P (Signed)
Traci Sweeney is a 29 y.o. female presenting for elevated blood pressures  29yo G1P0 @ 38+0 presents for evaluation of elevated Blood pressures. She has been feeling poorly over thee weekend and took her blood pressures and they were elevated. She called the office this morning and was advised to come to the hospital for evaluation. In MAU, her BPs have been 130-170/70-90. Will admit for induction for gestational hypertension.  History OB History    Gravida Para Term Preterm AB TAB SAB Ectopic Multiple Living   1 0             Past Medical History  Diagnosis Date  . HSV-1 infection   . Inguinal hernia infant    bilateral  . UTI (lower urinary tract infection) 2006 - 2007     Dr. Sherron Monday uro eval seconday to frequent infection with negative cysto   Past Surgical History  Procedure Laterality Date  . Inguinal hernia repair Bilateral infant  . Tonsillectomy  age 4  . Myringotomy  2005  . Cystoscopy  10/2005    negative  . Tympanoplasty  2006   Family History: family history includes Diabetes in her father; Heart attack (age of onset: 58) in her paternal grandfather; Heart disease in her mother; Hypertension in her brother and mother; Lung cancer in her maternal grandfather and paternal grandfather; Lymphoma in her paternal uncle; Thyroid disease in her mother. Social History:  reports that she quit smoking about 3 years ago. Her smoking use included Cigarettes. She has a 4 pack-year smoking history. She has never used smokeless tobacco. She reports that she does not drink alcohol or use illicit drugs.   Prenatal Transfer Tool  Maternal Diabetes: No, elevated 1 hr gtt, 3 hr WNL Genetic Screening: Normal Maternal Ultrasounds/Referrals: Abnormal:  Findings:   Isolated EIF (echogenic intracardiac focus) Fetal Ultrasounds or other Referrals:  None Maternal Substance Abuse:  No Significant Maternal Medications:  None Significant Maternal Lab Results:  None Other Comments:   None  ROS: as above    Blood pressure 125/72, pulse 76, temperature 97.7 F (36.5 C), resp. rate 18, height  (1.626 m), weight 203 lb 12.8 oz (92.443 kg), last menstrual period 07/28/2014. Exam Physical Exam   Results for orders placed or performed during the hospital encounter of 07/15/15 (from the past 24 hour(s))  Protein / creatinine ratio, urine     Status: None   Collection Time: 07/15/15 12:00 PM  Result Value Ref Range   Creatinine, Urine 22.00 mg/dL   Total Protein, Urine <6 mg/dL   Protein Creatinine Ratio        0.00 - 0.15 mg/mg[Cre]  Urinalysis, Routine w reflex microscopic (not at [facility])     Status: Abnormal   Collection Time: 07/15/15 12:31 PM  Result Value Ref Range   Color, Urine YELLOW YELLOW   APPearance CLEAR CLEAR   Specific Gravity, Urine 1.010 1.005 - 1.030   pH 7.0 5.0 - 8.0   Glucose, UA NEGATIVE NEGATIVE mg/dL   Hgb urine dipstick TRACE (A) NEGATIVE   Bilirubin Urine NEGATIVE NEGATIVE   Ketones, ur NEGATIVE NEGATIVE mg/dL   Protein, ur NEGATIVE NEGATIVE mg/dL   Urobilinogen, UA 0.2 0.0 - 1.0 mg/dL   Nitrite NEGATIVE NEGATIVE   Leukocytes, UA NEGATIVE NEGATIVE  Urine microscopic-add on     Status: Abnormal   Collection Time: 07/15/15 12:31 PM  Result Value Ref Range   Squamous Epithelial / LPF FEW (A) RARE  CBC with Differential/Platelet  Status: Abnormal   Collection Time: 07/15/15  1:23 PM  Result Value Ref Range   WBC 9.0 4.0 - 10.5 K/uL   RBC 3.78 (L) 3.87 - 5.11 MIL/uL   Hemoglobin 11.3 (L) 12.0 - 15.0 g/dL   HCT 16.1 (L) 09.6 - 04.5 %   MCV 87.6 78.0 - 100.0 fL   MCH 29.9 26.0 - 34.0 pg   MCHC 34.1 30.0 - 36.0 g/dL   RDW 40.9 81.1 - 91.4 %   Platelets 163 150 - 400 K/uL   Neutrophils Relative % 72 %   Neutro Abs 6.5 1.7 - 7.7 K/uL   Lymphocytes Relative 21 %   Lymphs Abs 1.9 0.7 - 4.0 K/uL   Monocytes Relative 7 %   Monocytes Absolute 0.6 0.1 - 1.0 K/uL   Eosinophils Relative 0 %   Eosinophils Absolute 0.0 0.0 - 0.7 K/uL    Basophils Relative 0 %   Basophils Absolute 0.0 0.0 - 0.1 K/uL  Comprehensive metabolic panel     Status: Abnormal   Collection Time: 07/15/15  1:23 PM  Result Value Ref Range   Sodium 136 135 - 145 mmol/L   Potassium 3.3 (L) 3.5 - 5.1 mmol/L   Chloride 107 101 - 111 mmol/L   CO2 22 22 - 32 mmol/L   Glucose, Bld 73 65 - 99 mg/dL   BUN 6 6 - 20 mg/dL   Creatinine, Ser 7.82 0.44 - 1.00 mg/dL   Calcium 8.5 (L) 8.9 - 10.3 mg/dL   Total Protein 6.2 (L) 6.5 - 8.1 g/dL   Albumin 3.0 (L) 3.5 - 5.0 g/dL   AST 19 15 - 41 U/L   ALT 13 (L) 14 - 54 U/L   Alkaline Phosphatase 148 (H) 38 - 126 U/L   Total Bilirubin 0.5 0.3 - 1.2 mg/dL   GFR calc non Af Amer >60 >60 mL/min   GFR calc Af Amer >60 >60 mL/min   Anion gap 7 5 - 15  Uric acid     Status: None   Collection Time: 07/15/15  1:23 PM  Result Value Ref Range   Uric Acid, Serum 3.3 2.3 - 6.6 mg/dL  Lactate dehydrogenase     Status: None   Collection Time: 07/15/15  1:23 PM  Result Value Ref Range   LDH 124 98 - 192 U/L    Prenatal labs: ABO, Rh:  O + Antibody:  Neg Rubella:  Imm RPR:   NR HBsAg:   Neg HIV:   NR GBS:   Neg  Assessment/Plan: 1) Admit 2) PIH labs WNL 3) Labetalol prn 4) Cytotec induction   Tylor Courtwright H. 07/15/2015, 2:57 PM

## 2015-07-15 NOTE — MAU Provider Note (Signed)
History     CSN: [id number]  Arrival date and time: 07/15/15 1154   First Provider Initiated Contact with Patient 07/15/15 1301      Chief Complaint  Patient presents with  . Hypertension   HPI Traci Sweeney Traci Sweeney is 29 y.o. G1P0 [redacted]w[redacted]d weeks presenting with elevated blood pressure reading at work==168/72..  She states BP was slightly elevated last week in the office but follow up reading was normal.  Otherwise pregnancy has been without complications except she passed 12 kidney stones at 28 weeks.  Did not feel well over the weekend with nausea/vomiting and elevated BPs at home yestersday.  Denies headache, visual changes and chest pain. + swelling in feet and hands.      Past Medical History  Diagnosis Date  . HSV-1 infection   . Inguinal hernia infant    bilateral  . UTI (lower urinary tract infection) 2006 - 2007     Dr. Sherron Monday uro eval seconday to frequent infection with negative cysto    Past Surgical History  Procedure Laterality Date  . Inguinal hernia repair Bilateral infant  . Tonsillectomy  age 20  . Myringotomy  2005  . Cystoscopy  10/2005    negative  . Tympanoplasty  2006    Family History  Problem Relation Age of Onset  . Lung cancer Paternal Grandfather     deceased  . Diabetes Father   . Heart attack Paternal Grandfather 89    deceased  . Hypertension Mother   . Thyroid disease Mother   . Heart disease Mother   . Hypertension Brother   . Lymphoma Paternal Uncle     deceased  . Lung cancer Maternal Grandfather     deceased    Social History  Substance Use Topics  . Smoking status: Former Smoker -- 0.50 packs/day for 8 years    Types: Cigarettes    Quit date: 08/05/2011  . Smokeless tobacco: Never Used  . Alcohol Use: No    Allergies: No Known Allergies  Prescriptions prior to admission  Medication Sig Dispense Refill Last Dose  . acetaminophen (TYLENOL) 500 MG tablet Take 1,000 mg by mouth every 6 (six) hours as needed for fever.    05/04/2015 at 1745  . cetirizine (ZYRTEC) 10 MG tablet Take 10 mg by mouth daily as needed for allergies.   05/03/2015 at Unknown time  . ondansetron (ZOFRAN ODT) 8 MG disintegrating tablet Take 1 tablet (8 mg total) by mouth every 8 (eight) hours as needed for nausea or vomiting. 12 tablet 0 PRN at PRN  . oxyCODONE-acetaminophen (PERCOCET/ROXICET) 5-325 MG per tablet Take 1 tablet by mouth every 4 (four) hours as needed for severe pain. 15 tablet 0 PRN at PRN  . Prenatal Vit-Fe Fumarate-FA (PRENATAL MULTIVITAMIN) TABS tablet Take 1 tablet by mouth at bedtime.    05/03/2015 at Unknown time    Review of Systems  Cardiovascular: Positive for leg swelling ( for swelling in her hands and feet  bilaterally). Negative for chest pain.  Gastrointestinal: Negative for abdominal pain (neg for contractions).  Genitourinary:       Neg for vaginal bleeding or loss of fluid.    Neurological: Negative for headaches.   Physical Exam   Blood pressure 148/79, pulse 91, temperature 97.7 F (36.5 C), resp. rate 18, height  (1.626 m), weight 203 lb 12.8 oz (92.443 kg), last menstrual period 07/28/2014.  Patient Vitals for the past 24 hrs:  BP Temp Pulse Resp Height Weight  07/15/15  1330 140/79 mmHg - 71 - - -  07/15/15 1315 136/87 mmHg - 79 - - -  07/15/15 1300 126/77 mmHg - 85 - - -  07/15/15 1245 137/74 mmHg - 76 - - -  07/15/15 1230 148/79 mmHg - 91 - - -  07/15/15 1223 155/87 mmHg - 78 - - -  07/15/15 1212 172/82 mmHg 97.7 F (36.5 C) 85 18  (1.626 m) 203 lb 12.8 oz (92.443 kg)     Physical Exam  Nursing note and vitals reviewed. Constitutional: She is oriented to person, place, and time. She appears well-developed and well-nourished.  HENT:  Head: Normocephalic.  Neck: Normal range of motion.  Cardiovascular: Normal rate.   Respiratory: Effort normal.  GI: There is no tenderness.  Neurological: She is alert and oriented to person, place, and time.  Skin: Skin is warm and dry.   Psychiatric: She has a normal mood and affect. Her behavior is normal.   Fetal Tracing:  Baseline: 125 Variability: moderate Accelerations: 15x15 Decelerations:   Toco: none  MAU Course  Procedures  Results for orders placed or performed during the hospital encounter of 07/15/15 (from the past 24 hour(s))  Protein / creatinine ratio, urine     Status: None   Collection Time: 07/15/15 12:00 PM  Result Value Ref Range   Creatinine, Urine 22.00 mg/dL   Total Protein, Urine <6 mg/dL   Protein Creatinine Ratio        0.00 - 0.15 mg/mg[Cre]  Urinalysis, Routine w reflex microscopic (not at [facility])     Status: Abnormal   Collection Time: 07/15/15 12:31 PM  Result Value Ref Range   Color, Urine YELLOW YELLOW   APPearance CLEAR CLEAR   Specific Gravity, Urine 1.010 1.005 - 1.030   pH 7.0 5.0 - 8.0   Glucose, UA NEGATIVE NEGATIVE mg/dL   Hgb urine dipstick TRACE (A) NEGATIVE   Bilirubin Urine NEGATIVE NEGATIVE   Ketones, ur NEGATIVE NEGATIVE mg/dL   Protein, ur NEGATIVE NEGATIVE mg/dL   Urobilinogen, UA 0.2 0.0 - 1.0 mg/dL   Nitrite NEGATIVE NEGATIVE   Leukocytes, UA NEGATIVE NEGATIVE  Urine microscopic-add on     Status: Abnormal   Collection Time: 07/15/15 12:31 PM  Result Value Ref Range   Squamous Epithelial / LPF FEW (A) RARE  CBC with Differential/Platelet     Status: Abnormal   Collection Time: 07/15/15  1:23 PM  Result Value Ref Range   WBC 9.0 4.0 - 10.5 K/uL   RBC 3.78 (L) 3.87 - 5.11 MIL/uL   Hemoglobin 11.3 (L) 12.0 - 15.0 g/dL   HCT 16.1 (L) 09.6 - 04.5 %   MCV 87.6 78.0 - 100.0 fL   MCH 29.9 26.0 - 34.0 pg   MCHC 34.1 30.0 - 36.0 g/dL   RDW 40.9 81.1 - 91.4 %   Platelets 163 150 - 400 K/uL   Neutrophils Relative % 72 %   Neutro Abs 6.5 1.7 - 7.7 K/uL   Lymphocytes Relative 21 %   Lymphs Abs 1.9 0.7 - 4.0 K/uL   Monocytes Relative 7 %   Monocytes Absolute 0.6 0.1 - 1.0 K/uL   Eosinophils Relative 0 %   Eosinophils Absolute 0.0 0.0 - 0.7 K/uL   Basophils  Relative 0 %   Basophils Absolute 0.0 0.0 - 0.1 K/uL  Comprehensive metabolic panel     Status: Abnormal   Collection Time: 07/15/15  1:23 PM  Result Value Ref Range   Sodium 136 135 -  145 mmol/L   Potassium 3.3 (L) 3.5 - 5.1 mmol/L   Chloride 107 101 - 111 mmol/L   CO2 22 22 - 32 mmol/L   Glucose, Bld 73 65 - 99 mg/dL   BUN 6 6 - 20 mg/dL   Creatinine, Ser 1.61 0.44 - 1.00 mg/dL   Calcium 8.5 (L) 8.9 - 10.3 mg/dL   Total Protein 6.2 (L) 6.5 - 8.1 g/dL   Albumin 3.0 (L) 3.5 - 5.0 g/dL   AST 19 15 - 41 U/L   ALT 13 (L) 14 - 54 U/L   Alkaline Phosphatase 148 (H) 38 - 126 U/L   Total Bilirubin 0.5 0.3 - 1.2 mg/dL   GFR calc non Af Amer >60 >60 mL/min   GFR calc Af Amer >60 >60 mL/min   Anion gap 7 5 - 15  Uric acid     Status: None   Collection Time: 07/15/15  1:23 PM  Result Value Ref Range   Uric Acid, Serum 3.3 2.3 - 6.6 mg/dL  Lactate dehydrogenase     Status: None   Collection Time: 07/15/15  1:23 PM  Result Value Ref Range   LDH 124 98 - 192 U/L    MDM LM in OR for Dr. Tenny Craw to call me back. MSE Labs   14:00 Care turned over to Nimrod, NP  1447-C/w Dr. Tenny Craw regarding BPs & labs. Will admit patient for IOL  Assessment and Plan  A:  Elevated Blood Pressure at [redacted]w[redacted]d gestation.  KEY,EVE M 07/15/2015, 1:04 PM    P: Admit to birthing suites for IOL per Dr. Trey Sailors, NP 07/15/2015 2:48 PM

## 2015-07-16 ENCOUNTER — Encounter (HOSPITAL_COMMUNITY): Payer: Self-pay | Admitting: Anesthesiology

## 2015-07-16 ENCOUNTER — Inpatient Hospital Stay (HOSPITAL_COMMUNITY): Payer: BLUE CROSS/BLUE SHIELD | Admitting: Anesthesiology

## 2015-07-16 LAB — RPR: RPR: NONREACTIVE

## 2015-07-16 MED ORDER — OXYTOCIN 40 UNITS IN LACTATED RINGERS INFUSION - SIMPLE MED
1.0000 m[IU]/min | INTRAVENOUS | Status: DC
  Administered 2015-07-16: 2 m[IU]/min via INTRAVENOUS

## 2015-07-16 MED ORDER — LIDOCAINE HCL (PF) 1 % IJ SOLN
INTRAMUSCULAR | Status: DC | PRN
  Administered 2015-07-16 (×2): 4 mL via EPIDURAL

## 2015-07-16 MED ORDER — PHENYLEPHRINE 40 MCG/ML (10ML) SYRINGE FOR IV PUSH (FOR BLOOD PRESSURE SUPPORT)
80.0000 ug | PREFILLED_SYRINGE | INTRAVENOUS | Status: DC | PRN
  Filled 2015-07-16: qty 20

## 2015-07-16 MED ORDER — DIPHENHYDRAMINE HCL 50 MG/ML IJ SOLN: 12.5000 mg | INTRAMUSCULAR | Status: DC | PRN

## 2015-07-16 MED ORDER — FENTANYL 2.5 MCG/ML BUPIVACAINE 1/10 % EPIDURAL INFUSION (WH - ANES)
14.0000 mL/h | INTRAMUSCULAR | Status: DC | PRN
  Administered 2015-07-16 – 2015-07-17 (×3): 14 mL/h via EPIDURAL
  Filled 2015-07-16 (×3): qty 125

## 2015-07-16 MED ORDER — EPHEDRINE 5 MG/ML INJ: 10.0000 mg | INTRAVENOUS | Status: DC | PRN

## 2015-07-16 NOTE — Anesthesia Preprocedure Evaluation (Signed)
Anesthesia Evaluation  Patient identified by MRN, date of birth, ID band Patient awake    Reviewed: Allergy & Precautions, Patient's Chart, lab work & pertinent test results  Airway Mallampati: III  TM Distance: >3 FB Neck ROM: Full    Dental no notable dental hx. (+) Teeth Intact   Pulmonary former smoker,    Pulmonary exam normal breath sounds clear to auscultation       Cardiovascular hypertension, Pt. on medications Normal cardiovascular exam Rhythm:Regular Rate:Normal     Neuro/Psych negative neurological ROS  negative psych ROS   GI/Hepatic Neg liver ROS, GERD  Medicated and Controlled,  Endo/Other  Obesity  Renal/GU negative Renal ROS  negative genitourinary   Musculoskeletal negative musculoskeletal ROS (+)   Abdominal (+) + obese,   Peds  Hematology  (+) anemia ,   Anesthesia Other Findings   Reproductive/Obstetrics (+) Pregnancy HSV                             Anesthesia Physical Anesthesia Plan  ASA: III  Anesthesia Plan: Epidural   Post-op Pain Management:    Induction:   Airway Management Planned: Natural Airway  Additional Equipment:   Intra-op Plan:   Post-operative Plan:   Informed Consent: I have reviewed the patients History and Physical, chart, labs and discussed the procedure including the risks, benefits and alternatives for the proposed anesthesia with the patient or authorized representative who has indicated his/her understanding and acceptance.     Plan Discussed with: Anesthesiologist  Anesthesia Plan Comments:         Anesthesia Quick Evaluation

## 2015-07-16 NOTE — Anesthesia Procedure Notes (Signed)
Epidural Patient location during procedure: OB Start time: 07/16/2015 10:04 PM  Staffing Anesthesiologist: Mal Amabile Performed by: anesthesiologist   Preanesthetic Checklist Completed: patient identified, site marked, surgical consent, pre-op evaluation, timeout performed, IV checked, risks and benefits discussed and monitors and equipment checked  Epidural Patient position: sitting Prep: site prepped and draped and DuraPrep Patient monitoring: continuous pulse ox and blood pressure Approach: midline Location: L3-L4 Injection technique: LOR air  Needle:  Needle type: Tuohy  Needle gauge: 17 G Needle length: 9 cm and 9 Needle insertion depth: 4 cm Catheter type: closed end flexible Catheter size: 19 Gauge Catheter at skin depth: 9 cm Test dose: negative and Other  Assessment Events: blood not aspirated, injection not painful, no injection resistance, negative IV test and no paresthesia  Additional Notes Patient identified. Risks and benefits discussed including failed block, incomplete  Pain control, post dural puncture headache, nerve damage, paralysis, blood pressure Changes, nausea, vomiting, reactions to medications-both toxic and allergic and post Partum back pain. All questions were answered. Patient expressed understanding and wished to proceed. Sterile technique was used throughout procedure. Epidural site was Dressed with sterile barrier dressing. No paresthesias, signs of intravascular injection Or signs of intrathecal spread were encountered.  Patient was more comfortable after the epidural was dosed. Please see RN's note for documentation of vital signs and FHR which are stable.

## 2015-07-16 NOTE — Progress Notes (Signed)
38.1 IOL GHTN Pt comfortable FHT 135 Cat 1 TOCO q2-4 SVE: 1/50/-2 S/p cytotec, ctx to frequently for additional doses Now SROM Pitocin 2x2 Will avoid frequent cervical checks

## 2015-07-17 ENCOUNTER — Encounter (HOSPITAL_COMMUNITY): Payer: Self-pay | Admitting: Anesthesiology

## 2015-07-17 ENCOUNTER — Encounter (HOSPITAL_COMMUNITY)
Admission: AD | Disposition: A | Discharge status: Home / Self Care | Payer: Self-pay | Source: Ambulatory Visit | Attending: Obstetrics & Gynecology

## 2015-07-17 DIAGNOSIS — Z98891 History of uterine scar from previous surgery: Secondary | ICD-10-CM

## 2015-07-17 SURGERY — Surgical Case
Anesthesia: Epidural

## 2015-07-17 MED ORDER — BUPIVACAINE HCL (PF) 0.25 % IJ SOLN
INTRAMUSCULAR | Status: AC
  Filled 2015-07-17: qty 30

## 2015-07-17 MED ORDER — DEXAMETHASONE SODIUM PHOSPHATE 4 MG/ML IJ SOLN
INTRAMUSCULAR | Status: DC | PRN
  Administered 2015-07-17: 4 mg via INTRAVENOUS

## 2015-07-17 MED ORDER — LIDOCAINE-EPINEPHRINE (PF) 2 %-1:200000 IJ SOLN
INTRAMUSCULAR | Status: AC
  Filled 2015-07-17: qty 20

## 2015-07-17 MED ORDER — DIBUCAINE 1 % RE OINT: 1.0000 "application " | TOPICAL_OINTMENT | RECTAL | Status: DC | PRN

## 2015-07-17 MED ORDER — MENTHOL 3 MG MT LOZG: 1.0000 | LOZENGE | OROMUCOSAL | Status: DC | PRN

## 2015-07-17 MED ORDER — NALBUPHINE HCL 10 MG/ML IJ SOLN
5.0000 mg | Freq: Once | INTRAMUSCULAR | Status: AC | PRN
  Filled 2015-07-17: qty 1

## 2015-07-17 MED ORDER — METOCLOPRAMIDE HCL 5 MG/ML IJ SOLN
INTRAMUSCULAR | Status: AC
  Filled 2015-07-17: qty 2

## 2015-07-17 MED ORDER — ACETAMINOPHEN 325 MG PO TABS
650.0000 mg | ORAL_TABLET | ORAL | Status: DC | PRN
  Administered 2015-07-19: 650 mg via ORAL
  Filled 2015-07-17: qty 2

## 2015-07-17 MED ORDER — DIPHENHYDRAMINE HCL 50 MG/ML IJ SOLN: 12.5000 mg | INTRAMUSCULAR | Status: DC | PRN

## 2015-07-17 MED ORDER — DIPHENHYDRAMINE HCL 25 MG PO CAPS: 25.0000 mg | ORAL_CAPSULE | Freq: Four times a day (QID) | ORAL | Status: DC | PRN

## 2015-07-17 MED ORDER — KETOROLAC TROMETHAMINE 30 MG/ML IJ SOLN
INTRAMUSCULAR | Status: AC
  Administered 2015-07-17: 30 mg via INTRAMUSCULAR
  Filled 2015-07-17: qty 1

## 2015-07-17 MED ORDER — KETOROLAC TROMETHAMINE 30 MG/ML IJ SOLN
30.0000 mg | Freq: Four times a day (QID) | INTRAMUSCULAR | Status: AC | PRN
  Administered 2015-07-17: 30 mg via INTRAMUSCULAR

## 2015-07-17 MED ORDER — ZOLPIDEM TARTRATE 5 MG PO TABS
5.0000 mg | ORAL_TABLET | Freq: Every evening | ORAL | Status: DC | PRN
Start: 2015-07-17 — End: 2015-07-20

## 2015-07-17 MED ORDER — MEPERIDINE HCL 25 MG/ML IJ SOLN
6.2500 mg | INTRAMUSCULAR | Status: DC | PRN
Start: 2015-07-17 — End: 2015-07-17

## 2015-07-17 MED ORDER — SCOPOLAMINE 1 MG/3DAYS TD PT72
MEDICATED_PATCH | TRANSDERMAL | Status: AC
  Filled 2015-07-17: qty 1

## 2015-07-17 MED ORDER — SIMETHICONE 80 MG PO CHEW
80.0000 mg | CHEWABLE_TABLET | ORAL | Status: DC
  Administered 2015-07-18 – 2015-07-19 (×3): 80 mg via ORAL
  Filled 2015-07-17 (×3): qty 1

## 2015-07-17 MED ORDER — TETANUS-DIPHTH-ACELL PERTUSSIS 5-2.5-18.5 LF-MCG/0.5 IM SUSP: 0.5000 mL | Freq: Once | INTRAMUSCULAR | Status: DC

## 2015-07-17 MED ORDER — BUPIVACAINE HCL (PF) 0.25 % IJ SOLN
INTRAMUSCULAR | Status: DC | PRN
  Administered 2015-07-17: 30 mL

## 2015-07-17 MED ORDER — IBUPROFEN 600 MG PO TABS
600.0000 mg | ORAL_TABLET | Freq: Four times a day (QID) | ORAL | Status: DC
  Administered 2015-07-18 – 2015-07-20 (×10): 600 mg via ORAL
  Filled 2015-07-17 (×10): qty 1

## 2015-07-17 MED ORDER — ONDANSETRON HCL 4 MG/2ML IJ SOLN
INTRAMUSCULAR | Status: AC
Start: 2015-07-17 — End: 2015-07-17
  Filled 2015-07-17: qty 2

## 2015-07-17 MED ORDER — OXYTOCIN 10 UNIT/ML IJ SOLN
40.0000 [IU] | INTRAMUSCULAR | Status: DC | PRN
  Administered 2015-07-17: 40 [IU] via INTRAVENOUS

## 2015-07-17 MED ORDER — OXYTOCIN 40 UNITS IN LACTATED RINGERS INFUSION - SIMPLE MED: 62.5000 mL/h | INTRAVENOUS | Status: AC

## 2015-07-17 MED ORDER — MORPHINE SULFATE (PF) 0.5 MG/ML IJ SOLN
INTRAMUSCULAR | Status: DC | PRN
  Administered 2015-07-17: 4 mg via EPIDURAL

## 2015-07-17 MED ORDER — SIMETHICONE 80 MG PO CHEW: 80.0000 mg | CHEWABLE_TABLET | ORAL | Status: DC | PRN

## 2015-07-17 MED ORDER — PRENATAL MULTIVITAMIN CH
1.0000 | ORAL_TABLET | Freq: Every day | ORAL | Status: DC
  Administered 2015-07-18 – 2015-07-20 (×2): 1 via ORAL
  Filled 2015-07-17 (×2): qty 1

## 2015-07-17 MED ORDER — DIPHENHYDRAMINE HCL 25 MG PO CAPS: 25.0000 mg | ORAL_CAPSULE | ORAL | Status: DC | PRN

## 2015-07-17 MED ORDER — LANOLIN HYDROUS EX OINT: 1.0000 "application " | TOPICAL_OINTMENT | CUTANEOUS | Status: DC | PRN

## 2015-07-17 MED ORDER — CEFAZOLIN SODIUM-DEXTROSE 2-3 GM-% IV SOLR
2.0000 g | INTRAVENOUS | Status: DC
  Filled 2015-07-17: qty 50

## 2015-07-17 MED ORDER — PHENYLEPHRINE HCL 10 MG/ML IJ SOLN
INTRAMUSCULAR | Status: DC | PRN
  Administered 2015-07-17: 40 ug via INTRAVENOUS

## 2015-07-17 MED ORDER — OXYCODONE-ACETAMINOPHEN 5-325 MG PO TABS: 1.0000 | ORAL_TABLET | ORAL | Status: DC | PRN

## 2015-07-17 MED ORDER — OXYCODONE-ACETAMINOPHEN 5-325 MG PO TABS: 2.0000 | ORAL_TABLET | ORAL | Status: DC | PRN

## 2015-07-17 MED ORDER — SCOPOLAMINE 1 MG/3DAYS TD PT72
MEDICATED_PATCH | TRANSDERMAL | Status: DC | PRN
  Administered 2015-07-17: 1 via TRANSDERMAL

## 2015-07-17 MED ORDER — ONDANSETRON HCL 4 MG/2ML IJ SOLN: 4.0000 mg | Freq: Once | INTRAMUSCULAR | Status: DC | PRN

## 2015-07-17 MED ORDER — SODIUM BICARBONATE 8.4 % IV SOLN
INTRAVENOUS | Status: AC
  Filled 2015-07-17: qty 50

## 2015-07-17 MED ORDER — LIDOCAINE-EPINEPHRINE (PF) 2 %-1:200000 IJ SOLN
INTRAMUSCULAR | Status: DC | PRN
  Administered 2015-07-17: 10 mL via EPIDURAL
  Administered 2015-07-17: 3 mL via EPIDURAL

## 2015-07-17 MED ORDER — MEPERIDINE HCL 25 MG/ML IJ SOLN
INTRAMUSCULAR | Status: AC
  Filled 2015-07-17: qty 1

## 2015-07-17 MED ORDER — SCOPOLAMINE 1 MG/3DAYS TD PT72: 1.0000 | MEDICATED_PATCH | Freq: Once | TRANSDERMAL | Status: DC

## 2015-07-17 MED ORDER — SODIUM CHLORIDE 0.9 % IJ SOLN: 3.0000 mL | INTRAMUSCULAR | Status: DC | PRN

## 2015-07-17 MED ORDER — FENTANYL CITRATE (PF) 100 MCG/2ML IJ SOLN: 25.0000 ug | INTRAMUSCULAR | Status: DC | PRN

## 2015-07-17 MED ORDER — CEFAZOLIN SODIUM-DEXTROSE 2-3 GM-% IV SOLR
INTRAVENOUS | Status: AC
  Filled 2015-07-17: qty 50

## 2015-07-17 MED ORDER — NALBUPHINE HCL 10 MG/ML IJ SOLN
5.0000 mg | INTRAMUSCULAR | Status: DC | PRN
  Administered 2015-07-18 (×2): 5 mg via SUBCUTANEOUS
  Filled 2015-07-17 (×2): qty 1

## 2015-07-17 MED ORDER — ETOMIDATE 2 MG/ML IV SOLN
INTRAVENOUS | Status: AC
  Filled 2015-07-17: qty 10

## 2015-07-17 MED ORDER — METOCLOPRAMIDE HCL 5 MG/ML IJ SOLN
INTRAMUSCULAR | Status: DC | PRN
  Administered 2015-07-17: 10 mg via INTRAVENOUS

## 2015-07-17 MED ORDER — LACTATED RINGERS IV SOLN
INTRAVENOUS | Status: DC | PRN
  Administered 2015-07-17: 18:00:00 via INTRAVENOUS

## 2015-07-17 MED ORDER — MORPHINE SULFATE (PF) 0.5 MG/ML IJ SOLN
INTRAMUSCULAR | Status: AC
  Filled 2015-07-17: qty 100

## 2015-07-17 MED ORDER — NALBUPHINE HCL 10 MG/ML IJ SOLN: 5.0000 mg | INTRAMUSCULAR | Status: DC | PRN

## 2015-07-17 MED ORDER — WITCH HAZEL-GLYCERIN EX PADS: 1.0000 "application " | MEDICATED_PAD | CUTANEOUS | Status: DC | PRN

## 2015-07-17 MED ORDER — PHENYLEPHRINE 40 MCG/ML (10ML) SYRINGE FOR IV PUSH (FOR BLOOD PRESSURE SUPPORT)
PREFILLED_SYRINGE | INTRAVENOUS | Status: AC
  Filled 2015-07-17: qty 10

## 2015-07-17 MED ORDER — 0.9 % SODIUM CHLORIDE (POUR BTL) OPTIME
TOPICAL | Status: DC | PRN
  Administered 2015-07-17: 1000 mL

## 2015-07-17 MED ORDER — NALOXONE HCL 1 MG/ML IJ SOLN: 1.0000 ug/kg/h | INTRAVENOUS | Status: DC | PRN

## 2015-07-17 MED ORDER — OXYTOCIN 10 UNIT/ML IJ SOLN
INTRAMUSCULAR | Status: AC
  Filled 2015-07-17: qty 4

## 2015-07-17 MED ORDER — NALOXONE HCL 0.4 MG/ML IJ SOLN: 0.4000 mg | INTRAMUSCULAR | Status: DC | PRN

## 2015-07-17 MED ORDER — ONDANSETRON HCL 4 MG/2ML IJ SOLN: 4.0000 mg | Freq: Three times a day (TID) | INTRAMUSCULAR | Status: DC | PRN

## 2015-07-17 MED ORDER — SENNOSIDES-DOCUSATE SODIUM 8.6-50 MG PO TABS
2.0000 | ORAL_TABLET | ORAL | Status: DC
  Administered 2015-07-18 – 2015-07-19 (×3): 2 via ORAL
  Filled 2015-07-17 (×3): qty 2

## 2015-07-17 MED ORDER — MEPERIDINE HCL 25 MG/ML IJ SOLN
INTRAMUSCULAR | Status: DC | PRN
  Administered 2015-07-17: 12.5 mg via INTRAVENOUS

## 2015-07-17 MED ORDER — LACTATED RINGERS IV SOLN
INTRAVENOUS | Status: DC
  Administered 2015-07-18 (×2): via INTRAVENOUS

## 2015-07-17 MED ORDER — INFLUENZA VAC SPLIT QUAD 0.5 ML IM SUSY: 0.5000 mL | PREFILLED_SYRINGE | INTRAMUSCULAR | Status: DC

## 2015-07-17 MED ORDER — CEFAZOLIN SODIUM-DEXTROSE 2-3 GM-% IV SOLR
INTRAVENOUS | Status: DC | PRN
  Administered 2015-07-17: 2 g via INTRAVENOUS

## 2015-07-17 MED ORDER — SIMETHICONE 80 MG PO CHEW
80.0000 mg | CHEWABLE_TABLET | Freq: Three times a day (TID) | ORAL | Status: DC
  Administered 2015-07-18 – 2015-07-19 (×6): 80 mg via ORAL
  Filled 2015-07-17 (×7): qty 1

## 2015-07-17 MED ORDER — KETOROLAC TROMETHAMINE 30 MG/ML IJ SOLN: 30.0000 mg | Freq: Four times a day (QID) | INTRAMUSCULAR | Status: AC | PRN

## 2015-07-17 MED ORDER — NALBUPHINE HCL 10 MG/ML IJ SOLN
5.0000 mg | Freq: Once | INTRAMUSCULAR | Status: AC | PRN
  Administered 2015-07-18: 5 mg via SUBCUTANEOUS

## 2015-07-17 MED ORDER — DEXAMETHASONE SODIUM PHOSPHATE 10 MG/ML IJ SOLN
INTRAMUSCULAR | Status: AC
  Filled 2015-07-17: qty 1

## 2015-07-17 SURGICAL SUPPLY — 40 items
APL SKNCLS STERI-STRIP NONHPOA (GAUZE/BANDAGES/DRESSINGS) ×1
BENZOIN TINCTURE PRP APPL 2/3 (GAUZE/BANDAGES/DRESSINGS) ×2 IMPLANT
CLAMP CORD UMBIL (MISCELLANEOUS) IMPLANT
CLOTH BEACON ORANGE TIMEOUT ST (SAFETY) ×2 IMPLANT
DRAPE SHEET LG 3/4 BI-LAMINATE (DRAPES) IMPLANT
DRSG OPSITE POSTOP 4X10 (GAUZE/BANDAGES/DRESSINGS) ×2 IMPLANT
DURAPREP 26ML APPLICATOR (WOUND CARE) ×2 IMPLANT
ELECT REM PT RETURN 9FT ADLT (ELECTROSURGICAL) ×2
ELECTRODE REM PT RTRN 9FT ADLT (ELECTROSURGICAL) ×1 IMPLANT
EXTRACTOR VACUUM BELL STYLE (SUCTIONS) ×1 IMPLANT
EXTRACTOR VACUUM KIWI (MISCELLANEOUS) IMPLANT
EXTRACTOR VACUUM M CUP 4 TUBE (SUCTIONS) ×2 IMPLANT
GLOVE BIO SURGEON STRL SZ 6.5 (GLOVE) ×2 IMPLANT
GLOVE BIOGEL PI IND STRL 7.0 (GLOVE) ×1 IMPLANT
GLOVE BIOGEL PI INDICATOR 7.0 (GLOVE) ×1
GOWN STRL REUS W/TWL LRG LVL3 (GOWN DISPOSABLE) ×6 IMPLANT
KIT ABG SYR 3ML LUER SLIP (SYRINGE) IMPLANT
NEEDLE HYPO 22GX1.5 SAFETY (NEEDLE) IMPLANT
NEEDLE HYPO 25X5/8 SAFETYGLIDE (NEEDLE) IMPLANT
NS IRRIG 1000ML POUR BTL (IV SOLUTION) ×2 IMPLANT
PACK C SECTION WH (CUSTOM PROCEDURE TRAY) ×2 IMPLANT
PAD ABD 8X7 1/2 STERILE (GAUZE/BANDAGES/DRESSINGS) ×2 IMPLANT
PAD OB MATERNITY 4.3X12.25 (PERSONAL CARE ITEMS) ×2 IMPLANT
PENCIL SMOKE EVAC W/HOLSTER (ELECTROSURGICAL) ×2 IMPLANT
RTRCTR C-SECT PINK 25CM LRG (MISCELLANEOUS) ×2 IMPLANT
STRIP CLOSURE SKIN 1/2X4 (GAUZE/BANDAGES/DRESSINGS) ×2 IMPLANT
SUT CHROMIC 0 CT 1 (SUTURE) ×2 IMPLANT
SUT CHROMIC 2 0 CT 1 (SUTURE) ×4 IMPLANT
SUT MNCRL 0 VIOLET CTX 36 (SUTURE) ×3 IMPLANT
SUT MONOCRYL 0 CTX 36 (SUTURE) ×3
SUT PDS AB 0 CTX 36 PDP370T (SUTURE) IMPLANT
SUT PLAIN 2 0 (SUTURE)
SUT PLAIN ABS 2-0 CT1 27XMFL (SUTURE) IMPLANT
SUT PROLENE 2 0 CT 30 (SUTURE) ×2 IMPLANT
SUT VIC AB 0 CTX 36 (SUTURE) ×4
SUT VIC AB 0 CTX36XBRD ANBCTRL (SUTURE) ×2 IMPLANT
SUT VIC AB 4-0 KS 27 (SUTURE) ×2 IMPLANT
SYR CONTROL 10ML LL (SYRINGE) IMPLANT
TOWEL OR 17X24 6PK STRL BLUE (TOWEL DISPOSABLE) ×2 IMPLANT
TRAY FOLEY CATH SILVER 14FR (SET/KITS/TRAYS/PACK) ×2 IMPLANT

## 2015-07-17 NOTE — Op Note (Signed)
Cesarean Section Procedure Note  Indications: Failure to progress; Arrest of labor at 9cm  Pre-operative Diagnosis: 38 week 2 day gestation arrest of labor at 9cm. Admitted for IOL for gestational HTN.   Post-operative Diagnosis: same  Surgeon: Shawneequa Baldridge STACIA   Assistants: none  Anesthesia: Spinal anesthesia  ASA Class: 2  Procedure Details   The patient was counseled about the risks, benefits, complications of the cesarean section using an interpreter. The patient concurred with the proposed plan, giving informed consent.  The site of surgery properly noted/marked. The patient was taken to Operating Room # 1, identified as Traci Sweeney and the procedure verified as C-Section Delivery. A Time Out was held and the above information confirmed.  After epidural anesthesia was found to be adequate, the patient was placed in the dorsal supine position with a leftward tilt. The abdomen was prepped and draped in normal uterine fashion.  A Pfannenstiel incision was made and carried down through the subcutaneous tissue to the fascia.  The fascia was incised in the midline and the fascial incision was extended laterally with Mayo scissors. The superior aspect of the fascial incision was grasped with Coker clamps x2, tented up and the rectus muscles dissected off sharply with the scalpel. The rectus was then dissected off with blunt dissection and Mayo scissors inferiorly. The rectus muscles were separated in the midline. The abdominal peritoneum was identified, tented up, entered sharply, and the incision was extended superiorly and inferiorly with good visualization of the bladder. The Alexis retractor was then deployed. The vesicouterine peritoneum was identified, tented up, entered sharply with Metzenbaum scissors, and the bladder flap was created digitally. Scalpel was then used to make a low transverse incision on the uterus which was extended laterally with  blunt dissection. The fetal vertex was  identified, with help from a nurse pushing up on the head vaginally, the head was brought out from the pelvis. A Mity Vacuum was placed to aid in delivery of the vertex.  The head was then delivered easily through the uterine incision followed by the body. The A live female infant was bulb suctioned on the operative field cried vigorously, cord was clamped and cut and the infant was passed to the waiting neonatologist. Apgars 7/8. Placenta was then delivered spontaneously, intact and appear normal, the uterus was cleared of all clot and debris. The uterine incision was repaired with #0 Monocryl in running locked fashion. A second imbricating suture was performed using the same suture. There was a noted extension into the left broad ligament that was repaired with 0-monocryl figure of eight.  Ovaries and tubes were inspected and normal. The Alexis retractor was removed. The abdominal cavity was cleared of all clot and debris. The abdominal peritoneum was reapproximated with 2-0 chromic  in a running fashion, the rectus muscles was reapproximated with #2 chromic in interrupted fashion. The fascia was closed with 0 Vicryl in a running fashion. The subcuticular layer was irrigated and all bleeders cauterized.  Subcutaneous layer was reapproximated with 2-0 plain.   The skin was closed with 4-0 vicryl in a subcuticular fashion using a Keith needle. The incision was dressed with benzoine, steri strips and pressure dressing. All sponge lap and needle counts were correct x3. Patient tolerated the procedure well and recovered in stable condition following the procedure.  Instrument, sponge, and needle counts were correct prior the abdominal closure and at the conclusion of the case.   Findings: Live female infant, Apgars 7/8 clear amniotic fluid, normal   appearing placenta, normal uterus, bilateral tubes and ovaries  Estimated Blood Loss: 1200 mL         Drains: Foley catheter         Specimens: Placenta to L&D          Implants: none         Complications:  None; patient tolerated the procedure well.         Disposition: PACU - hemodynamically stable.   Traci Sweeney STACIA  

## 2015-07-17 NOTE — Progress Notes (Signed)
Traci Sweeney is a 29 y.o. G1P0 at 2161w2d by LMP admitted for induction of labor due to Hypertension.  Subjective: Patient comfortable with epidural  Objective: BP 144/86 mmHg  Pulse 86  Temp(Src) 99.5 F (37.5 C) (Oral)  Resp 20  Ht 5\' 4"  (1.626 m)  Wt 91.627 kg (202 lb)  BMI 34.66 kg/m2  LMP 07/28/2014 I/O last 3 completed shifts: In: -  Out: 600 [Urine:600]    FHT:  FHR: 130 bpm, variability: moderate,  accelerations:  Present,  decelerations:  Absent UC:   regular, every 2-3 minutes SVE:   Dilation: 8 Effacement (%): 100 Station: 0 Exam by:: Dr. Mora ApplPInn  Labs: Lab Results  Component Value Date   WBC 9.0 07/15/2015   HGB 11.3* 07/15/2015   HCT 33.1* 07/15/2015   MCV 87.6 07/15/2015   PLT 163 07/15/2015    Assessment / Plan: Induction of labor due to gestational hypertension,  progressing well on pitocin  Labor: Progressing normally Preeclampsia:  no signs or symptoms of toxicity, intake and ouput balanced and labs stable Fetal Wellbeing:  Category I Pain Control:  Epidural I/D:  n/a Anticipated MOD:  NSVD  Traci Sweeney 07/17/2015, 11:21 AM

## 2015-07-17 NOTE — Progress Notes (Signed)
Traci Sweeney is a 29 y.o. G1P0 at 6739w2d by LMP admitted for induction of labor due to Hypertension and Pre-eclamptic toxemia of pregnancy..  Subjective: Patient feels a lot of pressure  Objective: BP 133/78 mmHg  Pulse 70  Temp(Src) 98.6 F (37 C) (Oral)  Resp 20  Ht 5\' 4"  (1.626 m)  Wt 202 lb (91.627 kg)  BMI 34.66 kg/m2  LMP 07/28/2014 I/O last 3 completed shifts: In: -  Out: 600 [Urine:600]    FHT:  FHR: 135 bpm, variability: moderate,  accelerations:  Present,  decelerations:  Absent UC:   regular, every 3 minutes SVE:   Dilation: 9 Effacement (%): 100 Station: +1 Exam by:: Dr. Mora ApplPinn  Labs: Lab Results  Component Value Date   WBC 9.0 07/15/2015   HGB 11.3* 07/15/2015   HCT 33.1* 07/15/2015   MCV 87.6 07/15/2015   PLT 163 07/15/2015    Assessment / Plan: Induction of labor due to gestational hypertension,  progressing well on pitocin Patient has been stalled at 9 cm for several hours Fetal position is Occiput Posterior.  Discussed with patient that we will try the peanut to get the baby to turn (Manual turning of baby was Attempted and was unsuccessful) We discussed that if she arrests at 9cm then we will need to proceed with a primary cesarean section  Fetal well being: Cat I, acceleration with fetal scalp stim  Pessy Delamar STACIA 07/17/2015, 4:27 PM

## 2015-07-17 NOTE — Transfer of Care (Signed)
Immediate Anesthesia Transfer of Care Note  Patient: Traci Sweeney  Procedure(s) Performed: Procedure(s): CESAREAN SECTION (N/A)  Patient Location: PACU  Anesthesia Type:Epidural  Level of Consciousness: awake, alert  and oriented  Airway & Oxygen Therapy: Patient Spontanous Breathing  Post-op Assessment: Report given to RN and Post -op Vital signs reviewed and stable  Post vital signs: Reviewed and stable  Last Vitals:  Filed Vitals:   07/17/15 1735  BP: 126/75  Pulse: 106  Temp:   Resp:     Complications: No apparent anesthesia complications

## 2015-07-17 NOTE — Lactation Note (Signed)
This note was copied from the chart of Traci Sweeney. Lactation Consultation Note  Patient Name: Traci Sweeney EXBMW'UToday's Date: 07/17/2015 Reason for consult: Initial assessment RN reported that baby was having a hard time maintaining a latch. Mom prefers the football position and FOB assists. Baby will go on breast for about 5-6 minutes then come off but goes right back on. Mom is really hot, sleepy, and hungry. She has been in labor since 07/14/15, she ask that lactation come back for more education when she feels better. Left handouts.   Maternal Data Has patient been taught Hand Expression?: Yes Does the patient have breastfeeding experience prior to this delivery?: No  Feeding Feeding Type: Breast Fed Length of feed: 15 min  LATCH Score/Interventions Latch: Grasps breast easily, tongue down, lips flanged, rhythmical sucking. Intervention(s): Assist with latch  Audible Swallowing: None Intervention(s): Skin to skin;Hand expression  Type of Nipple: Everted at rest and after stimulation  Comfort (Breast/Nipple): Soft / non-tender     Hold (Positioning): Assistance needed to correctly position infant at breast and maintain latch. Intervention(s): Position options;Support Pillows  LATCH Score: 7  Lactation Tools Discussed/Used WIC Program: No   Consult Status Consult Status: Follow-up Date: 07/18/15 Follow-up type: In-patient    [name] 07/17/2015, 9:56 PM

## 2015-07-17 NOTE — Progress Notes (Signed)
Traci Sweeney is a 29 y.o. G1P0 at 5936w2d by LMP admitted for induction of labor due to Hypertension.  Subjective: Patient comfortable  Objective: BP 156/84 mmHg  Pulse 83  Temp(Src) 98.6 F (37 C) (Oral)  Resp 20  Ht 5\' 4"  (1.626 m)  Wt 91.627 kg (202 lb)  BMI 34.66 kg/m2  LMP 07/28/2014 I/O last 3 completed shifts: In: -  Out: 600 [Urine:600]    FHT:  FHR: 135 bpm, variability: moderate,  accelerations:  Present,  decelerations:  Absent UC:   regular, every 3 minutes SVE:   Dilation: Lip/rim Effacement (%): 100 Station: +1 Exam by:: Dr. Mora ApplPinn  Labs: Lab Results  Component Value Date   WBC 9.0 07/15/2015   HGB 11.3* 07/15/2015   HCT 33.1* 07/15/2015   MCV 87.6 07/15/2015   PLT 163 07/15/2015    Assessment / Plan: Patient has been 9-9.5 for 8 hours despite 38 units of pitocin and adequate contractions.  Suspect fetal malposition (OP) Discussed with patient recommendation to proceed with a primary cesarean delivery and she agrees.  We discussed Risks of surgery to include hemorrhage, injury to organs, and infection.  Consent signed and placed into chart.   [name], [name] STACIA 07/17/2015, 5:40 PM

## 2015-07-17 NOTE — Anesthesia Postprocedure Evaluation (Signed)
Anesthesia Post Note  Patient: Traci Sweeney  Procedure(s) Performed: Procedure(s) (LRB): CESAREAN SECTION (N/A)  Anesthesia type: Epidural  Patient location: PACU  Post pain: Pain level controlled  Post assessment: Post-op Vital signs reviewed  Last Vitals:  Filed Vitals:   07/17/15 2026  BP: 123/69  Pulse: 88  Temp:   Resp:     Post vital signs: Reviewed  Level of consciousness: awake  Complications: No apparent anesthesia complications

## 2015-07-18 ENCOUNTER — Encounter (HOSPITAL_COMMUNITY): Payer: Self-pay | Admitting: Obstetrics & Gynecology

## 2015-07-18 LAB — CBC
HCT: 26.2 % — ABNORMAL LOW (ref 36.0–46.0)
HEMOGLOBIN: 8.9 g/dL — AB (ref 12.0–15.0)
MCH: 29.8 pg (ref 26.0–34.0)
MCHC: 34 g/dL (ref 30.0–36.0)
MCV: 87.6 fL (ref 78.0–100.0)
Platelets: 178 10*3/uL (ref 150–400)
RBC: 2.99 MIL/uL — AB (ref 3.87–5.11)
RDW: 14.4 % (ref 11.5–15.5)
WBC: 17.5 10*3/uL — ABNORMAL HIGH (ref 4.0–10.5)

## 2015-07-18 MED ORDER — FERROUS SULFATE 325 (65 FE) MG PO TABS
325.0000 mg | ORAL_TABLET | Freq: Every day | ORAL | Status: DC
  Administered 2015-07-18 – 2015-07-19 (×2): 325 mg via ORAL
  Filled 2015-07-18 (×3): qty 1

## 2015-07-18 NOTE — Clinical Documentation Improvement (Signed)
OB/GYN  Abnormal Lab/Test Results:  07/15/15: Hgb= 11.3: 07/18/15: Hgb= 8.9  Possible Clinical Conditions associated with below indicators  Acute Blood Loss Anemia  Other Condition  Cannot Clinically Determine   Supporting Information: C-section 07/17/15; EBL= 1200 cc   Please exercise your independent, professional judgment when responding. A specific answer is not anticipated or expected.   Thank You,  Cherylann Ratelonna J Luisa Louk, RN, BSN Health Information Management [place] [phone]

## 2015-07-18 NOTE — Addendum Note (Signed)
Addendum  created 07/18/15 0741 by Yolonda KidaAlison L Kutter Schnepf, CRNA   Modules edited: Notes Section   Notes Section:  File: [id number]

## 2015-07-18 NOTE — Anesthesia Postprocedure Evaluation (Signed)
  Anesthesia Post-op Note  Patient: Traci Sweeney  Procedure(s) Performed: Procedure(s): CESAREAN SECTION (N/A)  Patient Location: Mother/Baby  Anesthesia Type:Epidural  Level of Consciousness: awake, alert , oriented and patient cooperative  Airway and Oxygen Therapy: Patient Spontanous Breathing  Post-op Pain: none  Post-op Assessment: Post-op Vital signs reviewed, Patient's Cardiovascular Status Stable, Respiratory Function Stable, Patent Airway, No headache, No backache and Patient able to bend at knees              Post-op Vital Signs: Reviewed and stable  Last Vitals:  Filed Vitals:   07/18/15 0557  BP: 126/77  Pulse: 76  Temp: 36.8 C  Resp: 18    Complications: No apparent anesthesia complications

## 2015-07-18 NOTE — Progress Notes (Signed)
  Patient is eating, ambulating, voiding.  Pain control is good.  Filed Vitals:   07/18/15 0400 07/18/15 0500 07/18/15 0557 07/18/15 0700  BP:   126/77   Pulse:   76   Temp:   98.3 F (36.8 C)   TempSrc:   Oral   Resp:   18   Height:      Weight:      SpO2: 96% 97% 95% 95%    lungs:   clear to auscultation cor:    RRR Abdomen:  soft, appropriate tenderness, incisions intact and without erythema or exudate ex:    no cords   Lab Results  Component Value Date   WBC 17.5* 07/18/2015   HGB 8.9* 07/18/2015   HCT 26.2* 07/18/2015   MCV 87.6 07/18/2015   PLT 178 07/18/2015    --/--/O POS (10/10 1700)/RI  A/P    Post operative day 1.  Routine post op and postpartum care.  Expect d/c routine.  Iron.  Percocet for pain control.

## 2015-07-18 NOTE — Clinical Documentation Improvement (Signed)
OB/GYN  Abnormal Lab/Test Results:  07/15/15: K= 3.3  Possible Clinical Conditions associated with below indicators  Hypokalemia  Other Condition  Cannot Clinically Determine   Please exercise your independent, professional judgment when responding. A specific answer is not anticipated or expected.   Thank You,  Cherylann Ratelonna J Misha Antonini, RN, BSN Health Information Management [place] [phone]

## 2015-07-19 LAB — BIRTH TISSUE RECOVERY COLLECTION (PLACENTA DONATION)

## 2015-07-19 MED ORDER — OXYCODONE-ACETAMINOPHEN 5-325 MG PO TABS: 1.0000 | ORAL_TABLET | Freq: Four times a day (QID) | ORAL | Status: DC | PRN

## 2015-07-19 MED ORDER — IBUPROFEN 600 MG PO TABS: 600.0000 mg | ORAL_TABLET | Freq: Four times a day (QID) | ORAL | Status: DC | PRN

## 2015-07-19 MED ORDER — INFLUENZA VAC SPLIT QUAD 0.5 ML IM SUSY
0.5000 mL | PREFILLED_SYRINGE | INTRAMUSCULAR | Status: AC
  Administered 2015-07-20: 0.5 mL via INTRAMUSCULAR
  Filled 2015-07-19: qty 0.5

## 2015-07-19 NOTE — Discharge Instructions (Signed)

## 2015-07-19 NOTE — Lactation Note (Signed)
This note was copied from the chart of Traci Sweeney. Lactation Consultation Note  Mother states breastfeeding going well.  Mother states baby has been cluster feeding and her nipples are very tender. Baby latched at end of feeding on mother's nipple, no sucks observed. Reviewed how to [facility] baby and suggest not allowing her to hang out on the tip of her nipple. Provided mother w/ shells to wear in bra for soreness and encouraged comfort gels. Hand expression taught to Mom and provided her w/ hand pump to use. Reviewed engorgement care and monitoring voids/stools.   Patient Name: Traci Rhae LernerKristy Rath WUJWJ'XToday's Date: 07/19/2015 Reason for consult: Follow-up assessment   Maternal Data    Feeding Feeding Type: Breast Fed Length of feed: 15 min  LATCH Score/Interventions Latch: Grasps breast easily, tongue down, lips flanged, rhythmical sucking. Intervention(s): Skin to skin Intervention(s): Adjust position  Audible Swallowing: Spontaneous and intermittent Intervention(s): Skin to skin Intervention(s): Skin to skin  Type of Nipple: Everted at rest and after stimulation  Comfort (Breast/Nipple): Filling, red/small blisters or bruises, mild/mod discomfort  Problem noted: Mild/Moderate discomfort Interventions (Mild/moderate discomfort): Comfort gels  Hold (Positioning): Assistance needed to correctly position infant at breast and maintain latch.  LATCH Score: 8  Lactation Tools Discussed/Used     Consult Status Consult Status: Complete    Hardie PulleyBerkelhammer, Ruth Boschen 07/19/2015, 9:18 AM

## 2015-07-19 NOTE — Discharge Summary (Signed)
Obstetric Discharge Summary Reason for Admission: induction of labor Prenatal Procedures: none Intrapartum Procedures: cesarean: low cervical, transverse Postpartum Procedures: none Complications-Operative and Postpartum: none HEMOGLOBIN  Date Value Ref Range Status  07/18/2015 8.9* 12.0 - 15.0 g/dL Final   HCT  Date Value Ref Range Status  07/18/2015 26.2* 36.0 - 46.0 % Final    Physical Exam:  General: alert, cooperative and appears stated age 31Lochia: appropriate Uterine Fundus: firm Incision: healing well, no significant drainage DVT Evaluation: No evidence of DVT seen on physical exam.  Discharge Diagnoses: Term Pregnancy-delivered  Discharge Information: Date: 07/19/2015 Activity: pelvic rest Diet: routine Medications: PNV, Ibuprofen, Colace, Iron and Percocet Condition: stable Instructions: refer to practice specific booklet Discharge to: home Follow-up Information    [facility] Sanjuana MaeWALDA STACIA, MD.   Specialty:  Obstetrics and Gynecology   Why:  For wound re-check   Contact information:   [address] [place] [address] [phone]       Newborn Data: Live born female  Birth Weight: 7 lb 7.2 oz (3380 g) APGAR: 7, 8  Home with mother.  Nikira Kushnir GEFFEL Kentaro Alewine 07/19/2015, 8:45 AM

## 2015-07-19 NOTE — Progress Notes (Signed)
  Patient is eating, ambulating, voiding.  Pain control is good.  Bleeding appropriate  Filed Vitals:   07/18/15 0951 07/18/15 1300 07/18/15 1810 07/19/15 0600  BP: 116/62 115/58 128/90 116/64  Pulse: 70 75 79 70  Temp: 97.9 F (36.6 C) 98.1 F (36.7 C) 98.3 F (36.8 C) 99.1 F (37.3 C)  TempSrc: Oral Oral Oral Oral  Resp:  16 18 18   Height:      Weight:      SpO2: 100% 99% 100%     lungs:   clear to auscultation Abdomen:  soft, appropriate tenderness, incisions intact and without erythema or exudate ex:    symmetric   Lab Results  Component Value Date   WBC 17.5* 07/18/2015   HGB 8.9* 07/18/2015   HCT 26.2* 07/18/2015   MCV 87.6 07/18/2015   PLT 178 07/18/2015    --/--/O POS (10/10 1700)/RI  A/P    G1P1 POD#2 s/p 1 LTCS.  Routine post op and postpartum care.  ABLA--  Iron.  Percocet for pain control.  Meeting all goals, d/c today

## 2015-07-20 NOTE — Lactation Note (Signed)
This note was copied from the chart of Girl Rhae LernerKristy Utter. Lactation Consultation Note  Patient Name: Girl Rhae LernerKristy Birchard WUJWJ'XToday's Date: 07/20/2015 Reason for consult: Follow-up assessment;Breast/nipple pain;Infant weight loss Baby d/c'd with single photo therapy to be continued at home. Baby at 8% weight loss. Mom reports baby cluster feeding during the night and this am. Now sleepy. Mom c/o of sore nipples. Positional stripe noted bilateral. Mom's breasts are starting to fill. LC assisted Mom with latching baby at this visit. Baby initially getting a shallow latch. LC assisted Mom to obtain more depth and to keep lips well flanged. Mom reports less discomfort. Care for sore nipples reviewed. Engorgement care reviewed if needed. Basic teaching reviewed.  Encouraged Mom to post pump after feeding at home and give baby back any amount of EBM she receives due to jaundice and weight loss. Mom has DEBP for home use. Offered OP f/u, Mom will call.    Maternal Data    Feeding Feeding Type: Breast Fed Length of feed: 20 min  LATCH Score/Interventions Latch: Grasps breast easily, tongue down, lips flanged, rhythmical sucking. Intervention(s): Adjust position;Assist with latch;Breast massage;Breast compression  Audible Swallowing: A few with stimulation Intervention(s): Hand expression;Skin to skin  Type of Nipple: Everted at rest and after stimulation  Comfort (Breast/Nipple): Filling, red/small blisters or bruises, mild/mod discomfort  Problem noted: Cracked, bleeding, blisters, bruises;Mild/Moderate discomfort Interventions  (Cracked/bleeding/bruising/blister): Expressed breast milk to nipple Interventions (Mild/moderate discomfort): Comfort gels  Hold (Positioning): Assistance needed to correctly position infant at breast and maintain latch. Intervention(s): Breastfeeding basics reviewed;Support Pillows;Position options;Skin to skin  LATCH Score: 7  Lactation Tools Discussed/Used      Consult Status Consult Status: Complete Date: 07/20/15 Follow-up type: In-patient    Alfred LevinsGranger, Arihana Ambrocio Ann 07/20/2015, 2:12 PM

## 2015-08-05 ENCOUNTER — Ambulatory Visit: Payer: BC Managed Care – PPO | Admitting: Nurse Practitioner

## 2015-10-23 ENCOUNTER — Telehealth (HOSPITAL_COMMUNITY): Payer: Self-pay | Admitting: Lactation Services

## 2015-10-23 NOTE — Telephone Encounter (Signed)
21 month old, mom still having sore nipples, they had gotten better using Rx cream, the latch is very painful, there was a "sore" on both nipples, baby spit up bright red blood after latching, has apt with OP on 10/30/15, mom went back to work and had no idea how much to feed the baby, since baby spit up blood for the last 6 days she has just been pumping Paced bottle feedings, stomach as large as baby's fist, keep pumping, keep OP apt

## 2015-10-30 ENCOUNTER — Ambulatory Visit (HOSPITAL_COMMUNITY)
Admission: RE | Admit: 2015-10-30 | Discharge: 2015-10-30 | Disposition: A | Discharge status: Home / Self Care | Payer: BLUE CROSS/BLUE SHIELD | Source: Ambulatory Visit | Attending: Obstetrics & Gynecology | Admitting: Obstetrics & Gynecology

## 2015-10-30 NOTE — Lactation Note (Signed)
Lactation Consult for [employer] (mother) and Traci Sweeney (DOB: [birth year])  Mother's reason for visit: "Don't think baby is latching properly" Consult:  Initial Lactation Consultant:  Remigio Eisenmenger  ________________________________________________________________________ BW: 3380g (7# 7.2oz) Mid-Dec: 9# 9oz Today's weight: 11# 5.5oz _______________________________________________________________________  Mother's Name: Traci Sweeney Type of delivery:  VACS Breastfeeding Experience: "nipples have been sore the whole duration" Maternal Medical Conditions:  HSV Maternal Medications:  Mini-pill Fenugreek, (3 caps tid, has been taking for the last 1.5 weeks) ________________________________________________________________________  Breastfeeding History (Post Discharge)  Frequency of breastfeeding: only at night (b/c mother has returned to work). Duration of feeding: 40 min  Supplements W/Similac. Bottles are 1/2 EBM; 1/2 formula. Mom encouraged to not mix formula & EBM & to do formula-only or breastmilk-only feedings.   Pumping  Type of pump:  Medela pump in style Frequency: q3h Volume: 6oz+ in the morning; 3 oz after lunch;   Infant Intake and Output Assessment  Voids: 8 in 24 hrs.  Color:  Clear yellow Stools: 1 qod in 24 hrs.  Color:  Brown and Yellow  ________________________________________________________________________  Maternal Breast Assessment  Breast:  Full Nipple:  Erect  _______________________________________________________________________ Feeding Assessment/Evaluation  Initial feeding assessment:  Infant's oral assessment:  WNL  Attached assessment:  Deep  Lips flanged:  Yes.    Suck assessment:  Displays both   Pre-feed weight: 5146 g  Post-feed weight: 5200 g  Amount transferred: 54 ml in about 13 minutes R breast, cross-cradle  Pre-feed weight: 5200 g  Post-feed weight: 5272 g  Amount transferred: 72 ml in about 20  minutes L breast laid-back nursing/cross-cradle   Total amount transferred: 126 ml  Traci Sweeney is 3 1/2 months old & is almost 4 lbs above BW. Mom has an abundant supply & her nipples are intact. When the baby releases latch, her nipples are noted to have turned a deeper red/purple color, but Mom experiences no discomfort. Traci Sweeney does sometimes come on and off to look at her Mom or to adjust the flow of milk, which likely contributes to some nipple soreness. Mom was pleased to see how much Traci Sweeney transferred at the breast. We also spent time talking about her baby's possible preference for positioning, etc.   On 10-16-15, baby spat up (about a medium-size amount of) blood after a feeding onto her linens. (Mom showed me a picture from her phone). The blood was not frank blood, as had been described by Mom, but one could tell it had been diluted somewhat. Mom called the peds office and was instructed to just pump so her nipples could heal. However, after conversing w/Mom, it does not seem that this blood was from Mom's nipples. Mom stated that her nipples were intact that day and that she has never observed bleeding when pumping. Also, on the day of the spit-up, Mom had not noted any blood on her nipples when Traci Sweeney released her latch. Mom encouraged to share this with her peds the next time she is seen. Traci Sweeney has not spat up any blood since.   Traci Sweeney noted to have a very pleasant disposition.   Glenetta Hew, RN, BSN, MA, [employer]

## 2016-01-09 ENCOUNTER — Telehealth: Payer: Self-pay | Admitting: Nurse Practitioner

## 2016-01-09 NOTE — Telephone Encounter (Signed)
Spoke with patient. Patient states that she had a baby October 2016. Reports her GYN placed her on the "mini pill" as she has been breast feeding. Reports she stopped breast feeding/pumping two days ago and does not plan to restart. She would like to discuss alternative birth control at this time with Ria CommentPatricia Grubb, FNP. Advised she will need to be seen in the office to discuss birth control options. She is agreeable. Appointment scheduled for 01/14/2016 at 4 pm with Ria CommentPatricia Grubb, FNP. She is agreeable to date and time.  Routing to provider for final review. Patient agreeable to disposition. Will close encounter.

## 2016-01-09 NOTE — Telephone Encounter (Signed)
Patient said "I recently had a baby and on a mini pill". I would like to talk with Patty Grubb's nurse about the birth control pill I took in the past".

## 2016-01-14 ENCOUNTER — Encounter: Payer: Self-pay | Admitting: Nurse Practitioner

## 2016-01-14 ENCOUNTER — Ambulatory Visit (INDEPENDENT_AMBULATORY_CARE_PROVIDER_SITE_OTHER): Payer: BLUE CROSS/BLUE SHIELD | Admitting: Nurse Practitioner

## 2016-01-14 VITALS — BP 108/70 | HR 72 | Ht 65.0 in | Wt 182.0 lb

## 2016-01-14 DIAGNOSIS — Z3009 Encounter for other general counseling and advice on contraception: Secondary | ICD-10-CM | POA: Diagnosis not present

## 2016-01-14 MED ORDER — OMEPRAZOLE 20 MG PO CPDR: 20.0000 mg | DELAYED_RELEASE_CAPSULE | Freq: Every day | ORAL | Status: DC

## 2016-01-14 MED ORDER — DROSPIRENONE-ETHINYL ESTRADIOL 3-0.03 MG PO TABS: 1.0000 | ORAL_TABLET | Freq: Every day | ORAL | Status: DC

## 2016-01-14 NOTE — Progress Notes (Signed)
30 y.o. Married Caucasian female G1P1001 here for consult about birth control options.  she had a C section 07/16/2015.  She had gestational hypertension. For birth control she has been on Mic[place]or.  Now would like to discuss other options and wants to go back on [place]. She also had renal calculi during pregnancy and passed 11 stones.  She want another pregnancy in about 2 yrs.  No longer breast feeding.  She is having some issues with trying to juggle family, baby and work. anxious at times but not depressed.    O:  Healthy WD,WN female.  Normal in attitude and concentration.  Denies any depression or post partum issues.  She denies any harm to herself, baby, or anyone else.    A: Consult for birth control options  Desires to go back on Ocella  HTN with pregnancy - normal since 07/2015    P:  Discussed  Going back on Ocella.  If her hormonal issues is the cause of increased anxiety - then hopefully will be better back on OCP.  If she continues to feel anxious she will seek care with PCP.  AEX is due 07/2016.   Consult time:  15 minutes

## 2016-01-17 NOTE — Progress Notes (Signed)
Reviewed personally.  M. Suzanne Avyukt Cimo, MD.  

## 2016-04-20 ENCOUNTER — Telehealth: Payer: Self-pay | Admitting: Nurse Practitioner

## 2016-04-20 MED ORDER — DROSPIRENONE-ETHINYL ESTRADIOL 3-0.03 MG PO TABS: 1.0000 | ORAL_TABLET | Freq: Every day | ORAL | Status: DC

## 2016-04-20 NOTE — Telephone Encounter (Signed)
Spoke with patient. Patient states that after she was seen in April with Ria CommentPatricia Grubb, FNP she went to the pharmacy and was given Yaz OCP. Reports she was previously taking Yasmin before the birth of her child in October 2016 and thought she was going to be restarted on this. Advised rx that was sent in on 01/14/2016 was for Yasmin. Reports since starting taking Yaz as given to her by her pharmacy she has been experiencing dark spots on her face close to her upper lip and decreased libido. She does not feel comfortable with these symptoms on this medication. She is asking about alternatives or how to get her rx switched back to Yasmin. Advised I will speak with Ria CommentPatricia Grubb, FNP and return call with further recommendations. She is agreeable.

## 2016-04-20 NOTE — Telephone Encounter (Signed)
She may return to Yasmin - looks like in the past she took [place] generic and may want this brand.  Not sure why RX was changed at the pharmacy.

## 2016-04-20 NOTE — Telephone Encounter (Signed)
Spoke with patient. Advised of message as seen below from Ria CommentPatricia Grubb, FNP. She is agreeable. Rx for Yasmin #3 1RF sent to pharmacy on file. Note to pharmacy to only fill as Yasmin. The patient is agreeable and will return call with any further questions or if symptoms persist with OCP change.  Routing to provider for final review. Patient agreeable to disposition. Will close encounter.

## 2016-04-20 NOTE — Telephone Encounter (Signed)
Left message to call Kaitlyn at [phone]. 

## 2016-04-20 NOTE — Telephone Encounter (Signed)
Patient would like to speak with nurse about her birth control. °

## 2016-05-05 ENCOUNTER — Telehealth: Payer: Self-pay | Admitting: Nurse Practitioner

## 2016-05-05 NOTE — Telephone Encounter (Signed)
Patient called and requested a new prescription for her birth control. She said, "The pharmacy wants to charge me $95.00 for it because it is brand name. They won't let me have the generic version unless Patty sends over a new prescription stating it's okay."  Pharmacy on file is correct.

## 2016-05-06 MED ORDER — DROSPIRENONE-ETHINYL ESTRADIOL 3-0.03 MG PO TABS: 1.0000 | ORAL_TABLET | Freq: Every day | ORAL | 1 refills | Status: DC

## 2016-05-06 NOTE — Telephone Encounter (Signed)
Patient has taken [place] before. Rx sent as Royanne Foots because pharmacy dispensed Yaz previously.   Call to patient and advised Rx for [facility] sent. Advised to open Rx before leaving pharmacy and if not Ocella and they are not able to dispense Ocella (explained that all pharmacies carry different generics) then to have prescription transferred.  She is agreeable and appreciates assistance.  Routing to provider for final review. Patient agreeable to disposition. Will close encounter.

## 2016-06-30 ENCOUNTER — Telehealth: Payer: Self-pay | Admitting: *Deleted

## 2016-06-30 MED ORDER — OMEPRAZOLE-SODIUM BICARBONATE 20-1100 MG PO CAPS: 1.0000 | ORAL_CAPSULE | Freq: Every day | ORAL | 1 refills | Status: DC

## 2016-06-30 NOTE — Telephone Encounter (Signed)
Form received requesting change from Omeprazole to Zegerid.  Form signed by Shirlyn GoltzPatty Grubb, FNP and faxed to pharmacy.  Medication list updated to reflect these changes.  Hardcopy with signature sent to scan.

## 2016-07-27 ENCOUNTER — Telehealth: Payer: Self-pay | Admitting: Nurse Practitioner

## 2016-07-27 MED ORDER — DROSPIRENONE-ETHINYL ESTRADIOL 3-0.03 MG PO TABS: 1.0000 | ORAL_TABLET | Freq: Every day | ORAL | 0 refills | Status: DC

## 2016-07-27 NOTE — Telephone Encounter (Signed)
Spoke with patient. Advised 1 [facility]. Patient will need to keep AEX appt 08/06/16 to obtain further refills. Patient verbalizes understanding and is agreeable.     Routing to provider for final review. Patient is agreeable to disposition. Will close encounter.

## 2016-07-27 NOTE — Telephone Encounter (Signed)
[facility] Verified to fill Ocella- generic.

## 2016-07-27 NOTE — Telephone Encounter (Signed)
Lets refill Ocella at pharmacy of her choice, and when she returns for AEX will address the libido and amenorrhea.  I believe she is saying spotting only with time of cycle but no menses.  This is not uncommon on OCP as we are making endo lining thinner with being on OCP.

## 2016-07-27 NOTE — Telephone Encounter (Signed)
1. Patient called and requested to speak with the nurse about "labido and menstrual cycles."  2. Patient is requesting assistance with getting refills from from a new pharmacy for her birth control. She said, "My old pharmacy is telling me I don't have any refills left, but I should." Patient is not sure when she is due for her AEX.  [facility] [place] [phone]

## 2016-07-27 NOTE — Telephone Encounter (Signed)
Spoke with patient. Patient reports decreased labido and spotting, with no cycle this month. Patient states UPT negative 07/26/16. Patient states she was attempting to ref[facility], but they tell her no refills are available. Patient states she was supposed to start new pack 07/26/16. Last PAP 07/29/12; Last OV 01/14/16. Patient scheduled for AEX 08/05/16. Patient request [facility] [place]. Advised would review with Ria CommentPatricia Grubb, NPfor recommendations And return call.     [facility] with Alaina at [facility]; Zegerid has 2RF, drospirenone-ethinyl estradiol 0RF.     Ria CommentPatricia Grubb, NP, ok to refill drospirenone-ethinyl estradiol x1 until next AEX?

## 2016-08-05 ENCOUNTER — Encounter: Payer: Self-pay | Admitting: Nurse Practitioner

## 2016-08-05 ENCOUNTER — Ambulatory Visit (INDEPENDENT_AMBULATORY_CARE_PROVIDER_SITE_OTHER): Payer: BLUE CROSS/BLUE SHIELD | Admitting: Nurse Practitioner

## 2016-08-05 VITALS — BP 114/72 | HR 60 | Ht 64.25 in | Wt 149.0 lb

## 2016-08-05 DIAGNOSIS — Z3009 Encounter for other general counseling and advice on contraception: Secondary | ICD-10-CM | POA: Diagnosis not present

## 2016-08-05 DIAGNOSIS — Z0000 Encounter for general adult medical examination without abnormal findings: Secondary | ICD-10-CM

## 2016-08-05 LAB — POCT URINALYSIS DIPSTICK
Bilirubin, UA: NEGATIVE
Blood, UA: NEGATIVE
Glucose, UA: NEGATIVE
Ketones, UA: NEGATIVE
LEUKOCYTES UA: NEGATIVE
Nitrite, UA: NEGATIVE
PH UA: 6
PROTEIN UA: NEGATIVE
UROBILINOGEN UA: NEGATIVE

## 2016-08-05 MED ORDER — DROSPIRENONE-ETHINYL ESTRADIOL 3-0.03 MG PO TABS: 1.0000 | ORAL_TABLET | Freq: Every day | ORAL | 4 refills | Status: DC

## 2016-08-05 NOTE — Patient Instructions (Addendum)

## 2016-08-05 NOTE — Progress Notes (Signed)
Patient ID: George InaKristy A Laughman, female   DOB: [birth year], 30 y.o.   MRN: [id number]  30 y.o. 681P1001 Married  Caucasian Fe here for annual exam.  Menses now 3-4 days.  This last time on off week did not get a cycle but had pain RLQ.  Pain is now gone.  Back on OCP - Yasmin for 3 months and  the last menses was skipped.  She has been compliant to OCP.  Baby was delivered by C Section 07/17/15.  [facility].  Patient's last menstrual period was 07/01/2016 (exact date).          Sexually active: Yes.    The current method of family planning is OCP (estrogen/progesterone).    Exercising: No.  The patient does not participate in regular exercise at present. Smoker:  no  Health Maintenance: Pap: 07/2014? TDaP: 04/01/09 Gardasil: completed in 2007 HIV: 01/14/15 Labs: HB: blood donation within last 2 weeks  Urine: negative,  UPT: negative   reports that she quit smoking about 5 years ago. Her smoking use included Cigarettes. She has a 4.00 pack-year smoking history. She has never used smokeless tobacco. She reports that she does not drink alcohol or use drugs.  Past Medical History:  Diagnosis Date  . HSV-1 infection   . Inguinal hernia infant   bilateral  . UTI (lower urinary tract infection) 2006 - 2007    Dr. Sherron MondayMacDiarmid uro eval seconday to frequent infection with negative cysto    Past Surgical History:  Procedure Laterality Date  . CESAREAN SECTION N/A 07/17/2015   Procedure: CESAREAN SECTION;  Surgeon: Essie HartWalda Pinn, MD;  Location: [facility];  Service: Obstetrics;  Laterality: N/A;  . CYSTOSCOPY  10/2005   negative  . INGUINAL HERNIA REPAIR Bilateral infant  . MYRINGOTOMY  2005  . TONSILLECTOMY  age 30  . TYMPANOPLASTY  2006    Current Outpatient Prescriptions  Medication Sig Dispense Refill  . drospirenone-ethinyl estradiol (OCELLA) 3-0.03 MG tablet Take 1 tablet by mouth daily. 1 Package 0  . Omeprazole-Sodium Bicarbonate (ZEGERID) 20-1100 MG CAPS capsule Take 1  capsule by mouth daily before breakfast. 30 each 1   No current facility-administered medications for this visit.     Family History  Problem Relation Age of Onset  . Diabetes Father   . Hypertension Mother   . Thyroid disease Mother   . Heart disease Mother   . Lung cancer Paternal Grandfather     deceased  . Heart attack Paternal Grandfather [phone]    deceased  . Lymphoma Paternal Uncle     deceased  . Lung cancer Maternal Grandfather     deceased    ROS:  Pertinent items are noted in HPI.  Otherwise, a comprehensive ROS was negative.  Exam:   BP 114/72 (BP Location: Right Arm, Patient Position: Sitting, Cuff Size: Normal)   Pulse 60   Ht 5' 4.25" (1.632 m)   Wt 149 lb (67.6 kg)   LMP 07/01/2016 (Exact Date)   Breastfeeding? No   BMI 25.38 kg/m  Height: 5' 4.25" (163.2 cm) Ht Readings from Last 3 Encounters:  08/05/16 5' 4.25" (1.632 m)  01/14/16 5\' 5"  (1.651 m)  07/15/15 5\' 4"  (1.626 m)    General appearance: alert, cooperative and appears stated age Head: Normocephalic, without obvious abnormality, atraumatic Neck: no adenopathy, supple, symmetrical, trachea midline and thyroid normal to inspection and palpation Lungs: clear to auscultation bilaterally Breasts: normal appearance, no masses or tenderness Heart: regular  rate and rhythm Abdomen: soft, non-tender; no masses,  no organomegaly Extremities: extremities normal, atraumatic, no cyanosis or edema Skin: Skin color, texture, turgor normal. No rashes or lesions Lymph nodes: Cervical, supraclavicular, and axillary nodes normal. No abnormal inguinal nodes palpated Neurologic: Grossly normal   Pelvic: External genitalia:  no lesions              Urethra:  normal appearing urethra with no masses, tenderness or lesions              Bartholin's and Skene's: normal                 Vagina: normal appearing vagina with normal color and discharge, no lesions              Cervix: anteverted              Pap taken:  No. Bimanual Exam:  Uterus:  normal size, contour, position, consistency, mobility, non-tender              Adnexa: no mass, fullness, tenderness - no mass or pain               Rectovaginal: Confirms               Anus:  normal sphincter tone, no lesions  Chaperone present: yes  A:  Well Woman with normal exam  OCP for contraception  Recent RLQ pain - resolved    P:   Reviewed health and wellness pertinent to exam  Pap smear as above  Refill on Ocella for a year - information is given about Mirena IUD  May want another pregnancy in 2 yrs.  Counseled on breast self exam, use and side effects of OCP's, adequate intake of calcium and vitamin D, diet and exercise return annually or prn  An After Visit Summary was printed and given to the patient.

## 2016-08-09 NOTE — Progress Notes (Signed)
Encounter reviewed by Dr. Brook Amundson C. Silva.  

## 2017-04-27 ENCOUNTER — Telehealth: Payer: Self-pay | Admitting: Obstetrics & Gynecology

## 2017-04-27 NOTE — Telephone Encounter (Signed)
Left message for patient to reschedule Traci Sweeney appt. °

## 2017-06-08 DIAGNOSIS — Z01419 Encounter for gynecological examination (general) (routine) without abnormal findings: Secondary | ICD-10-CM | POA: Diagnosis not present

## 2017-06-08 DIAGNOSIS — Z113 Encounter for screening for infections with a predominantly sexual mode of transmission: Secondary | ICD-10-CM | POA: Diagnosis not present

## 2017-06-08 DIAGNOSIS — Z124 Encounter for screening for malignant neoplasm of cervix: Secondary | ICD-10-CM | POA: Diagnosis not present

## 2017-06-08 DIAGNOSIS — N925 Other specified irregular menstruation: Secondary | ICD-10-CM | POA: Diagnosis not present

## 2017-07-05 DIAGNOSIS — Z3480 Encounter for supervision of other normal pregnancy, unspecified trimester: Secondary | ICD-10-CM | POA: Diagnosis not present

## 2017-07-05 DIAGNOSIS — Z3682 Encounter for antenatal screening for nuchal translucency: Secondary | ICD-10-CM | POA: Diagnosis not present

## 2017-07-05 DIAGNOSIS — Z315 Encounter for genetic counseling: Secondary | ICD-10-CM | POA: Diagnosis not present

## 2017-07-05 LAB — OB RESULTS CONSOLE HIV ANTIBODY (ROUTINE TESTING): HIV: NONREACTIVE

## 2017-07-05 LAB — OB RESULTS CONSOLE RPR: RPR: NONREACTIVE

## 2017-07-05 LAB — OB RESULTS CONSOLE ANTIBODY SCREEN: ANTIBODY SCREEN: NEGATIVE

## 2017-07-05 LAB — OB RESULTS CONSOLE GC/CHLAMYDIA
CHLAMYDIA, DNA PROBE: NEGATIVE
Gonorrhea: NEGATIVE

## 2017-07-05 LAB — OB RESULTS CONSOLE ABO/RH: RH TYPE: POSITIVE

## 2017-07-05 LAB — OB RESULTS CONSOLE HEPATITIS B SURFACE ANTIGEN: Hepatitis B Surface Ag: NEGATIVE

## 2017-07-05 LAB — OB RESULTS CONSOLE RUBELLA ANTIBODY, IGM: Rubella: IMMUNE

## 2017-08-03 DIAGNOSIS — Z3482 Encounter for supervision of other normal pregnancy, second trimester: Secondary | ICD-10-CM | POA: Diagnosis not present

## 2017-08-18 ENCOUNTER — Ambulatory Visit: Payer: BLUE CROSS/BLUE SHIELD | Admitting: Nurse Practitioner

## 2017-08-23 DIAGNOSIS — Z363 Encounter for antenatal screening for malformations: Secondary | ICD-10-CM | POA: Diagnosis not present

## 2017-10-14 DIAGNOSIS — Z369 Encounter for antenatal screening, unspecified: Secondary | ICD-10-CM | POA: Diagnosis not present

## 2017-10-14 DIAGNOSIS — Z3480 Encounter for supervision of other normal pregnancy, unspecified trimester: Secondary | ICD-10-CM | POA: Diagnosis not present

## 2017-10-14 DIAGNOSIS — Z23 Encounter for immunization: Secondary | ICD-10-CM | POA: Diagnosis not present

## 2017-10-20 DIAGNOSIS — O99810 Abnormal glucose complicating pregnancy: Secondary | ICD-10-CM | POA: Diagnosis not present

## 2017-10-27 ENCOUNTER — Encounter: Payer: 59 | Attending: Obstetrics and Gynecology | Admitting: Registered"

## 2017-10-27 DIAGNOSIS — Z713 Dietary counseling and surveillance: Secondary | ICD-10-CM | POA: Diagnosis not present

## 2017-10-27 DIAGNOSIS — O24419 Gestational diabetes mellitus in pregnancy, unspecified control: Secondary | ICD-10-CM | POA: Diagnosis not present

## 2017-10-27 DIAGNOSIS — R7309 Other abnormal glucose: Secondary | ICD-10-CM

## 2017-10-29 ENCOUNTER — Encounter: Payer: Self-pay | Admitting: Registered"

## 2017-10-29 DIAGNOSIS — R7309 Other abnormal glucose: Secondary | ICD-10-CM | POA: Insufficient documentation

## 2017-10-29 NOTE — Progress Notes (Signed)
Patient was seen on 10/27/17 for Gestational Diabetes self-management class at the [facility]. The following learning objectives were met by the patient during this course:   States the definition of Gestational Diabetes  States why dietary management is important in controlling blood glucose  Describes the effects each nutrient has on blood glucose levels  Demonstrates ability to create a balanced meal plan  Demonstrates carbohydrate counting   States when to check blood glucose levels  Demonstrates proper blood glucose monitoring techniques  States the effect of stress and exercise on blood glucose levels  States the importance of limiting caffeine and abstaining from alcohol and smoking  Blood glucose monitor given: none Lot # n/a Exp: n/a Blood glucose reading: n/a  Patient instructed to monitor glucose levels: FBS: 60 - <95 1 hour: <140 2 hour: <120  Patient received handouts:  Nutrition Diabetes and Pregnancy  Carbohydrate Counting List  Patient will be seen for follow-up as needed.

## 2017-11-19 DIAGNOSIS — J101 Influenza due to other identified influenza virus with other respiratory manifestations: Secondary | ICD-10-CM | POA: Diagnosis not present

## 2017-11-19 MED FILL — OSELTAMIVIR PHOSPHATE 75 MG: 75 | 5 days supply | Qty: 10 | Fill #0

## 2017-11-24 DIAGNOSIS — H5213 Myopia, bilateral: Secondary | ICD-10-CM | POA: Diagnosis not present

## 2017-12-13 DIAGNOSIS — R300 Dysuria: Secondary | ICD-10-CM | POA: Diagnosis not present

## 2017-12-14 DIAGNOSIS — O24419 Gestational diabetes mellitus in pregnancy, unspecified control: Secondary | ICD-10-CM | POA: Diagnosis not present

## 2017-12-14 DIAGNOSIS — Z3480 Encounter for supervision of other normal pregnancy, unspecified trimester: Secondary | ICD-10-CM | POA: Diagnosis not present

## 2017-12-14 LAB — OB RESULTS CONSOLE GBS: STREP GROUP B AG: POSITIVE

## 2017-12-21 ENCOUNTER — Encounter (HOSPITAL_COMMUNITY): Payer: Self-pay | Admitting: *Deleted

## 2017-12-21 ENCOUNTER — Telehealth (HOSPITAL_COMMUNITY): Payer: Self-pay | Admitting: *Deleted

## 2017-12-21 NOTE — Telephone Encounter (Signed)
Preadmission screen  

## 2017-12-22 ENCOUNTER — Telehealth (HOSPITAL_COMMUNITY): Payer: Self-pay | Admitting: *Deleted

## 2017-12-22 NOTE — Telephone Encounter (Signed)
Preadmission screen  

## 2017-12-23 ENCOUNTER — Telehealth (HOSPITAL_COMMUNITY): Payer: Self-pay | Admitting: *Deleted

## 2017-12-23 NOTE — Telephone Encounter (Signed)
Preadmission screen  

## 2017-12-24 ENCOUNTER — Telehealth (HOSPITAL_COMMUNITY): Payer: Self-pay | Admitting: *Deleted

## 2017-12-24 NOTE — Telephone Encounter (Signed)
Preadmission screen  

## 2017-12-27 ENCOUNTER — Encounter (HOSPITAL_COMMUNITY): Payer: Self-pay

## 2018-01-03 NOTE — Patient Instructions (Signed)
George InaKristy A Sylvia  01/03/2018   Your procedure is scheduled on:  01/05/2018  [facility] at [facility]30 AM.  Pick up the phone at the desk and dial [phone]  Call this number if you have problems the morning of [phone]  Remember:   Do not eat food:(After Midnight) Desps de medianoche.  Do not drink clear liquids: (After Midnight) Desps de medianoche.  Take these medicines the morning of surgery with A SIP OF WATER: none   Do not wear jewelry, make-up or nail polish.  Do not wear lotions, powders, or perfumes. Do not wear deodorant.  Do not shave 48 hours prior to surgery.  Do not bring valuables to the hospital.  [facility] is not   responsible for any belongings or valuables brought to the hospital.  Contacts, dentures or bridgework may not be worn into surgery.  Leave suitcase in the car. After surgery it may be brought to your room.  For patients admitted to the hospital, checkout time is 11:00 AM the day of              discharge.    N/A   Please read over the following fact sheets that you were given:   Surgical Site Infection Prevention

## 2018-01-04 ENCOUNTER — Other Ambulatory Visit: Payer: Self-pay | Admitting: Obstetrics and Gynecology

## 2018-01-04 ENCOUNTER — Encounter (HOSPITAL_COMMUNITY)
Admission: RE | Admit: 2018-01-04 | Discharge: 2018-01-04 | Disposition: A | Discharge status: Home / Self Care | Payer: 59 | Source: Ambulatory Visit | Attending: Obstetrics and Gynecology | Admitting: Obstetrics and Gynecology

## 2018-01-04 DIAGNOSIS — D649 Anemia, unspecified: Secondary | ICD-10-CM | POA: Diagnosis not present

## 2018-01-04 DIAGNOSIS — O24420 Gestational diabetes mellitus in childbirth, diet controlled: Secondary | ICD-10-CM | POA: Diagnosis not present

## 2018-01-04 DIAGNOSIS — Z87891 Personal history of nicotine dependence: Secondary | ICD-10-CM | POA: Diagnosis not present

## 2018-01-04 DIAGNOSIS — Z3A38 38 weeks gestation of pregnancy: Secondary | ICD-10-CM | POA: Diagnosis not present

## 2018-01-04 DIAGNOSIS — O34211 Maternal care for low transverse scar from previous cesarean delivery: Secondary | ICD-10-CM | POA: Diagnosis not present

## 2018-01-04 DIAGNOSIS — O9902 Anemia complicating childbirth: Secondary | ICD-10-CM | POA: Diagnosis not present

## 2018-01-04 DIAGNOSIS — O99214 Obesity complicating childbirth: Secondary | ICD-10-CM | POA: Diagnosis not present

## 2018-01-04 DIAGNOSIS — E669 Obesity, unspecified: Secondary | ICD-10-CM | POA: Diagnosis not present

## 2018-01-04 HISTORY — DX: Gestational diabetes mellitus in pregnancy, unspecified control: O24.419

## 2018-01-04 LAB — CBC
HCT: 34.4 % — ABNORMAL LOW (ref 36.0–46.0)
HEMOGLOBIN: 11.6 g/dL — AB (ref 12.0–15.0)
MCH: 29.9 pg (ref 26.0–34.0)
MCHC: 33.7 g/dL (ref 30.0–36.0)
MCV: 88.7 fL (ref 78.0–100.0)
Platelets: 191 10*3/uL (ref 150–400)
RBC: 3.88 MIL/uL (ref 3.87–5.11)
RDW: 13.7 % (ref 11.5–15.5)
WBC: 7.4 10*3/uL (ref 4.0–10.5)

## 2018-01-04 LAB — TYPE AND SCREEN
ABO/RH(D): O POS
Antibody Screen: NEGATIVE

## 2018-01-04 NOTE — H&P (Signed)
32 y.o.  G2P1001 5797w6d comes in for a repeat cesarean section at term.  Patient has good fetal movement and no bleeding.    Past Medical History:  Diagnosis Date  . Gestational diabetes   . HSV-1 infection   . Inguinal hernia infant   bilateral  . UTI (lower urinary tract infection) 2006 - 2007    Dr. Sherron MondayMacDiarmid uro eval seconday to frequent infection with negative cysto    Past Surgical History:  Procedure Laterality Date  . CESAREAN SECTION N/A 07/17/2015   Procedure: CESAREAN SECTION;  Surgeon: Essie HartWalda Pinn, MD;  Location: [facility];  Service: Obstetrics;  Laterality: N/A;  . CYSTOSCOPY  10/2005   negative  . INGUINAL HERNIA REPAIR Bilateral infant  . MYRINGOTOMY  2005  . TONSILLECTOMY  age 32  . TYMPANOPLASTY  2006    OB History  Gravida Para Term Preterm AB Living  2 1 1  0 0 1  SAB TAB Ectopic Multiple Live Births  0 0 0 0 1    # Outcome Date GA Lbr Len/2nd Weight Sex Delivery Anes PTL Lv  2 Current           1 Term 07/17/15 10577w2d  7 lb 7.2 oz (3.38 kg) F CS-LTranv EPI  LIV     Birth Comments: dilated to 9 cm    Social History   Socioeconomic History  . Marital status: Married    Spouse name: Not on file  . Number of children: 0  . Years of education: Not on file  . Highest education level: Not on file  Occupational History  . Not on file  Social Needs  . Financial resource strain: Not on file  . Food insecurity:    Worry: Not on file    Inability: Not on file  . Transportation needs:    Medical: Not on file    Non-medical: Not on file  Tobacco Use  . Smoking status: Former Smoker    Packs/day: 0.50    Years: 8.00    Pack years: 4.00    Types: Cigarettes    Last attempt to quit: 08/05/2011    Years since quitting: 6.4  . Smokeless tobacco: Never Used  Substance and Sexual Activity  . Alcohol use: No  . Drug use: No  . Sexual activity: Yes    Partners: Male    Birth control/protection: Pill  Lifestyle  . Physical activity:    Days per week: Not on  file    Minutes per session: Not on file  . Stress: Not on file  Relationships  . Social connections:    Talks on phone: Not on file    Gets together: Not on file    Attends religious service: Not on file    Active member of club or organization: Not on file    Attends meetings of clubs or organizations: Not on file    Relationship status: Not on file  . Intimate partner violence:    Fear of current or ex partner: Not on file    Emotionally abused: Not on file    Physically abused: Not on file    Forced sexual activity: Not on file  Other Topics Concern  . Not on file  Social History Narrative  . Not on file   Patient has no known allergies.   Prenatal Course: uncomplicated.   Prenatal Transfer Tool  Maternal Diabetes: Yes:  Diabetes Type:  Diet controlled Genetic Screening: Normal Maternal Ultrasounds/Referrals: Normal Fetal Ultrasounds or other  Referrals:  None Maternal Substance Abuse:  No Significant Maternal Medications:  None Significant Maternal Lab Results: None  There were no vitals filed for this visit.  Lungs/Cor:  NAD Abdomen:  soft, gravid Ex:  no cords, erythema SVE:  NA FHTs:  present  A/P   For repeat cesarean sectionat term.  All risks, benefits and alternatives discussed with patient and she desires to proceed.  Lilyrose Tanney A

## 2018-01-05 ENCOUNTER — Inpatient Hospital Stay (HOSPITAL_COMMUNITY): Payer: 59 | Admitting: Anesthesiology

## 2018-01-05 ENCOUNTER — Encounter (HOSPITAL_COMMUNITY)
Admission: AD | Disposition: A | Discharge status: Home / Self Care | Payer: Self-pay | Source: Ambulatory Visit | Attending: Obstetrics and Gynecology

## 2018-01-05 ENCOUNTER — Inpatient Hospital Stay (HOSPITAL_COMMUNITY)
Admission: AD | Admit: 2018-01-05 | Discharge: 2018-01-07 | DRG: 788 | Disposition: A | Discharge status: Home / Self Care | Payer: 59 | Source: Ambulatory Visit | Attending: Obstetrics and Gynecology | Admitting: Obstetrics and Gynecology

## 2018-01-05 ENCOUNTER — Encounter (HOSPITAL_COMMUNITY): Payer: Self-pay

## 2018-01-05 DIAGNOSIS — Z87891 Personal history of nicotine dependence: Secondary | ICD-10-CM | POA: Diagnosis not present

## 2018-01-05 DIAGNOSIS — D649 Anemia, unspecified: Secondary | ICD-10-CM | POA: Diagnosis present

## 2018-01-05 DIAGNOSIS — O9902 Anemia complicating childbirth: Secondary | ICD-10-CM | POA: Diagnosis present

## 2018-01-05 DIAGNOSIS — O34219 Maternal care for unspecified type scar from previous cesarean delivery: Secondary | ICD-10-CM | POA: Diagnosis not present

## 2018-01-05 DIAGNOSIS — Z3A38 38 weeks gestation of pregnancy: Secondary | ICD-10-CM

## 2018-01-05 DIAGNOSIS — O24420 Gestational diabetes mellitus in childbirth, diet controlled: Secondary | ICD-10-CM | POA: Diagnosis present

## 2018-01-05 DIAGNOSIS — O34211 Maternal care for low transverse scar from previous cesarean delivery: Secondary | ICD-10-CM | POA: Diagnosis present

## 2018-01-05 DIAGNOSIS — E669 Obesity, unspecified: Secondary | ICD-10-CM | POA: Diagnosis present

## 2018-01-05 DIAGNOSIS — O321XX1 Maternal care for breech presentation, fetus 1: Secondary | ICD-10-CM | POA: Diagnosis not present

## 2018-01-05 DIAGNOSIS — O99214 Obesity complicating childbirth: Secondary | ICD-10-CM | POA: Diagnosis present

## 2018-01-05 DIAGNOSIS — Z3A00 Weeks of gestation of pregnancy not specified: Secondary | ICD-10-CM | POA: Diagnosis not present

## 2018-01-05 DIAGNOSIS — Z9889 Other specified postprocedural states: Secondary | ICD-10-CM

## 2018-01-05 DIAGNOSIS — O134 Gestational [pregnancy-induced] hypertension without significant proteinuria, complicating childbirth: Secondary | ICD-10-CM | POA: Diagnosis not present

## 2018-01-05 LAB — RPR: RPR: NONREACTIVE

## 2018-01-05 LAB — GLUCOSE, CAPILLARY
GLUCOSE-CAPILLARY: 89 mg/dL (ref 65–99)
Glucose-Capillary: 82 mg/dL (ref 65–99)

## 2018-01-05 SURGERY — Surgical Case
Anesthesia: Spinal | Wound class: Clean Contaminated

## 2018-01-05 MED ORDER — ACETAMINOPHEN 325 MG PO TABS: 650.0000 mg | ORAL_TABLET | ORAL | Status: DC | PRN

## 2018-01-05 MED ORDER — NALBUPHINE HCL 10 MG/ML IJ SOLN: 5.0000 mg | INTRAMUSCULAR | Status: DC | PRN

## 2018-01-05 MED ORDER — SIMETHICONE 80 MG PO CHEW
80.0000 mg | CHEWABLE_TABLET | ORAL | Status: DC
  Administered 2018-01-05 – 2018-01-06 (×2): 80 mg via ORAL
  Filled 2018-01-05 (×2): qty 1

## 2018-01-05 MED ORDER — DEXTROSE 5 % IV SOLN
1.0000 ug/kg/h | INTRAVENOUS | Status: DC | PRN
  Filled 2018-01-05: qty 5

## 2018-01-05 MED ORDER — PHENYLEPHRINE 8 MG IN D5W 100 ML (0.08MG/ML) PREMIX OPTIME
INJECTION | INTRAVENOUS | Status: DC | PRN
  Administered 2018-01-05: 60 ug/min via INTRAVENOUS

## 2018-01-05 MED ORDER — PRENATAL MULTIVITAMIN CH
1.0000 | ORAL_TABLET | Freq: Every day | ORAL | Status: DC
  Administered 2018-01-06 – 2018-01-07 (×2): 1 via ORAL
  Filled 2018-01-05 (×2): qty 1

## 2018-01-05 MED ORDER — ONDANSETRON HCL 4 MG/2ML IJ SOLN
INTRAMUSCULAR | Status: AC
  Filled 2018-01-05: qty 2

## 2018-01-05 MED ORDER — LACTATED RINGERS IV SOLN
INTRAVENOUS | Status: DC
  Administered 2018-01-05 (×2): via INTRAVENOUS

## 2018-01-05 MED ORDER — IBUPROFEN 800 MG PO TABS
800.0000 mg | ORAL_TABLET | Freq: Three times a day (TID) | ORAL | Status: DC
  Administered 2018-01-05 – 2018-01-07 (×5): 800 mg via ORAL
  Filled 2018-01-05 (×6): qty 1

## 2018-01-05 MED ORDER — OXYCODONE HCL 5 MG PO TABS: 10.0000 mg | ORAL_TABLET | ORAL | Status: DC | PRN

## 2018-01-05 MED ORDER — METHYLERGONOVINE MALEATE 0.2 MG/ML IJ SOLN: 0.2000 mg | INTRAMUSCULAR | Status: DC | PRN

## 2018-01-05 MED ORDER — SODIUM CHLORIDE 0.9% FLUSH: 3.0000 mL | INTRAVENOUS | Status: DC | PRN

## 2018-01-05 MED ORDER — OXYTOCIN 40 UNITS IN LACTATED RINGERS INFUSION - SIMPLE MED: 2.5000 [IU]/h | INTRAVENOUS | Status: AC

## 2018-01-05 MED ORDER — MEPERIDINE HCL 25 MG/ML IJ SOLN: 6.2500 mg | INTRAMUSCULAR | Status: DC | PRN

## 2018-01-05 MED ORDER — ONDANSETRON HCL 4 MG/2ML IJ SOLN
INTRAMUSCULAR | Status: DC | PRN
  Administered 2018-01-05: 4 mg via INTRAVENOUS

## 2018-01-05 MED ORDER — FERROUS SULFATE 325 (65 FE) MG PO TABS
325.0000 mg | ORAL_TABLET | Freq: Two times a day (BID) | ORAL | Status: DC
  Administered 2018-01-06 – 2018-01-07 (×3): 325 mg via ORAL
  Filled 2018-01-05 (×4): qty 1

## 2018-01-05 MED ORDER — NALOXONE HCL 0.4 MG/ML IJ SOLN: 0.4000 mg | INTRAMUSCULAR | Status: DC | PRN

## 2018-01-05 MED ORDER — DIBUCAINE 1 % RE OINT: 1.0000 "application " | TOPICAL_OINTMENT | RECTAL | Status: DC | PRN

## 2018-01-05 MED ORDER — MEASLES, MUMPS & RUBELLA VAC ~~LOC~~ INJ
0.5000 mL | INJECTION | Freq: Once | SUBCUTANEOUS | Status: DC
  Filled 2018-01-05: qty 0.5

## 2018-01-05 MED ORDER — MORPHINE SULFATE (PF) 0.5 MG/ML IJ SOLN
INTRAMUSCULAR | Status: DC | PRN
  Administered 2018-01-05: .2 mg via INTRATHECAL

## 2018-01-05 MED ORDER — OXYCODONE HCL 5 MG PO TABS: 5.0000 mg | ORAL_TABLET | ORAL | Status: DC | PRN

## 2018-01-05 MED ORDER — FAMOTIDINE 20 MG PO TABS
20.0000 mg | ORAL_TABLET | Freq: Two times a day (BID) | ORAL | Status: DC
  Administered 2018-01-05 – 2018-01-07 (×3): 20 mg via ORAL
  Filled 2018-01-05 (×4): qty 1

## 2018-01-05 MED ORDER — FLEET ENEMA 7-19 GM/118ML RE ENEM: 1.0000 | ENEMA | Freq: Every day | RECTAL | Status: DC | PRN

## 2018-01-05 MED ORDER — SCOPOLAMINE 1 MG/3DAYS TD PT72
1.0000 | MEDICATED_PATCH | Freq: Once | TRANSDERMAL | Status: DC
  Administered 2018-01-05: 1.5 mg via TRANSDERMAL

## 2018-01-05 MED ORDER — FENTANYL CITRATE (PF) 100 MCG/2ML IJ SOLN
INTRAMUSCULAR | Status: AC
  Filled 2018-01-05: qty 2

## 2018-01-05 MED ORDER — PHENYLEPHRINE 40 MCG/ML (10ML) SYRINGE FOR IV PUSH (FOR BLOOD PRESSURE SUPPORT)
PREFILLED_SYRINGE | INTRAVENOUS | Status: DC | PRN
  Administered 2018-01-05 (×4): 40 ug via INTRAVENOUS

## 2018-01-05 MED ORDER — OXYTOCIN 10 UNIT/ML IJ SOLN
INTRAVENOUS | Status: DC | PRN
  Administered 2018-01-05: 40 [IU] via INTRAVENOUS

## 2018-01-05 MED ORDER — SIMETHICONE 80 MG PO CHEW
80.0000 mg | CHEWABLE_TABLET | Freq: Three times a day (TID) | ORAL | Status: DC
  Administered 2018-01-06 – 2018-01-07 (×5): 80 mg via ORAL
  Filled 2018-01-05 (×5): qty 1

## 2018-01-05 MED ORDER — SENNOSIDES-DOCUSATE SODIUM 8.6-50 MG PO TABS
2.0000 | ORAL_TABLET | ORAL | Status: DC
  Administered 2018-01-05 – 2018-01-06 (×2): 2 via ORAL
  Filled 2018-01-05 (×2): qty 2

## 2018-01-05 MED ORDER — DIPHENHYDRAMINE HCL 25 MG PO CAPS: 25.0000 mg | ORAL_CAPSULE | Freq: Four times a day (QID) | ORAL | Status: DC | PRN

## 2018-01-05 MED ORDER — MORPHINE SULFATE (PF) 0.5 MG/ML IJ SOLN
INTRAMUSCULAR | Status: AC
  Filled 2018-01-05: qty 10

## 2018-01-05 MED ORDER — BISACODYL 10 MG RE SUPP: 10.0000 mg | Freq: Every day | RECTAL | Status: DC | PRN

## 2018-01-05 MED ORDER — LACTATED RINGERS IV SOLN
INTRAVENOUS | Status: DC | PRN
  Administered 2018-01-05: 08:00:00 via INTRAVENOUS

## 2018-01-05 MED ORDER — NALBUPHINE HCL 10 MG/ML IJ SOLN: 5.0000 mg | Freq: Once | INTRAMUSCULAR | Status: DC | PRN

## 2018-01-05 MED ORDER — COCONUT OIL OIL
1.0000 "application " | TOPICAL_OIL | Status: DC | PRN
  Administered 2018-01-06: 1 via TOPICAL
  Filled 2018-01-05: qty 120

## 2018-01-05 MED ORDER — PHENYLEPHRINE 8 MG IN D5W 100 ML (0.08MG/ML) PREMIX OPTIME
INJECTION | INTRAVENOUS | Status: AC
  Filled 2018-01-05: qty 100

## 2018-01-05 MED ORDER — CEFAZOLIN SODIUM-DEXTROSE 2-4 GM/100ML-% IV SOLN
2.0000 g | INTRAVENOUS | Status: AC
  Administered 2018-01-05: 2 g via INTRAVENOUS
  Filled 2018-01-05: qty 100

## 2018-01-05 MED ORDER — DIPHENHYDRAMINE HCL 50 MG/ML IJ SOLN: 12.5000 mg | INTRAMUSCULAR | Status: DC | PRN

## 2018-01-05 MED ORDER — TETANUS-DIPHTH-ACELL PERTUSSIS 5-2.5-18.5 LF-MCG/0.5 IM SUSP: 0.5000 mL | Freq: Once | INTRAMUSCULAR | Status: DC

## 2018-01-05 MED ORDER — ZOLPIDEM TARTRATE 5 MG PO TABS: 5.0000 mg | ORAL_TABLET | Freq: Every evening | ORAL | Status: DC | PRN

## 2018-01-05 MED ORDER — DIPHENHYDRAMINE HCL 25 MG PO CAPS
25.0000 mg | ORAL_CAPSULE | ORAL | Status: DC | PRN
  Filled 2018-01-05: qty 1

## 2018-01-05 MED ORDER — WITCH HAZEL-GLYCERIN EX PADS: 1.0000 "application " | MEDICATED_PAD | CUTANEOUS | Status: DC | PRN

## 2018-01-05 MED ORDER — SCOPOLAMINE 1 MG/3DAYS TD PT72
MEDICATED_PATCH | TRANSDERMAL | Status: AC
  Filled 2018-01-05: qty 1

## 2018-01-05 MED ORDER — KETOROLAC TROMETHAMINE 30 MG/ML IJ SOLN
INTRAMUSCULAR | Status: AC
  Filled 2018-01-05: qty 1

## 2018-01-05 MED ORDER — ONDANSETRON HCL 4 MG/2ML IJ SOLN
4.0000 mg | Freq: Three times a day (TID) | INTRAMUSCULAR | Status: DC | PRN
  Administered 2018-01-05: 4 mg via INTRAVENOUS

## 2018-01-05 MED ORDER — KETOROLAC TROMETHAMINE 30 MG/ML IJ SOLN
30.0000 mg | Freq: Four times a day (QID) | INTRAMUSCULAR | Status: AC | PRN
  Administered 2018-01-05: 30 mg via INTRAVENOUS
  Filled 2018-01-05: qty 1

## 2018-01-05 MED ORDER — LACTATED RINGERS IV SOLN
INTRAVENOUS | Status: DC
  Administered 2018-01-05 (×2): via INTRAVENOUS

## 2018-01-05 MED ORDER — FENTANYL CITRATE (PF) 100 MCG/2ML IJ SOLN
INTRAMUSCULAR | Status: DC | PRN
  Administered 2018-01-05: 25 ug via INTRATHECAL

## 2018-01-05 MED ORDER — OXYTOCIN 10 UNIT/ML IJ SOLN
INTRAMUSCULAR | Status: AC
Start: 2018-01-05 — End: 2018-01-05
  Filled 2018-01-05: qty 4

## 2018-01-05 MED ORDER — BUPIVACAINE IN DEXTROSE 0.75-8.25 % IT SOLN
INTRATHECAL | Status: DC | PRN
  Administered 2018-01-05: 11 mg via INTRATHECAL

## 2018-01-05 MED ORDER — SIMETHICONE 80 MG PO CHEW: 80.0000 mg | CHEWABLE_TABLET | ORAL | Status: DC | PRN

## 2018-01-05 MED ORDER — MENTHOL 3 MG MT LOZG: 1.0000 | LOZENGE | OROMUCOSAL | Status: DC | PRN

## 2018-01-05 MED ORDER — PROMETHAZINE HCL 25 MG/ML IJ SOLN
12.5000 mg | INTRAMUSCULAR | Status: DC | PRN
  Administered 2018-01-05: 12.5 mg via INTRAVENOUS
  Filled 2018-01-05: qty 1

## 2018-01-05 MED ORDER — KETOROLAC TROMETHAMINE 30 MG/ML IJ SOLN
30.0000 mg | Freq: Four times a day (QID) | INTRAMUSCULAR | Status: AC | PRN
  Administered 2018-01-05: 30 mg via INTRAMUSCULAR

## 2018-01-05 MED ORDER — METHYLERGONOVINE MALEATE 0.2 MG PO TABS: 0.2000 mg | ORAL_TABLET | ORAL | Status: DC | PRN

## 2018-01-05 SURGICAL SUPPLY — 36 items
APL SKNCLS STERI-STRIP NONHPOA (GAUZE/BANDAGES/DRESSINGS) ×1
BENZOIN TINCTURE PRP APPL 2/3 (GAUZE/BANDAGES/DRESSINGS) ×3 IMPLANT
BLADE 10 SAFETY STRL DISP (BLADE) ×9 IMPLANT
CELLS DAT CNTRL 66122 CELL SVR (MISCELLANEOUS) ×1 IMPLANT
CHLORAPREP W/TINT 26ML (MISCELLANEOUS) ×3 IMPLANT
CLAMP CORD UMBIL (MISCELLANEOUS) IMPLANT
CLOSURE WOUND 1/2 X4 (GAUZE/BANDAGES/DRESSINGS) ×1
CLOTH BEACON ORANGE TIMEOUT ST (SAFETY) ×3 IMPLANT
DRSG OPSITE POSTOP 4X10 (GAUZE/BANDAGES/DRESSINGS) ×3 IMPLANT
ELECT REM PT RETURN 9FT ADLT (ELECTROSURGICAL) ×3
ELECTRODE REM PT RTRN 9FT ADLT (ELECTROSURGICAL) ×1 IMPLANT
EXTRACTOR VACUUM BELL STYLE (SUCTIONS) IMPLANT
GLOVE BIO SURGEON STRL SZ7 (GLOVE) ×3 IMPLANT
GLOVE BIOGEL PI IND STRL 7.0 (GLOVE) ×1 IMPLANT
GLOVE BIOGEL PI INDICATOR 7.0 (GLOVE) ×2
GOWN STRL REUS W/TWL LRG LVL3 (GOWN DISPOSABLE) ×6 IMPLANT
KIT ABG SYR 3ML LUER SLIP (SYRINGE) IMPLANT
NEEDLE HYPO 25X5/8 SAFETYGLIDE (NEEDLE) IMPLANT
NS IRRIG 1000ML POUR BTL (IV SOLUTION) ×3 IMPLANT
PACK C SECTION WH (CUSTOM PROCEDURE TRAY) ×3 IMPLANT
PAD ABD 8X7 1/2 STERILE (GAUZE/BANDAGES/DRESSINGS) ×6 IMPLANT
PAD OB MATERNITY 4.3X12.25 (PERSONAL CARE ITEMS) ×3 IMPLANT
PENCIL SMOKE EVAC W/HOLSTER (ELECTROSURGICAL) ×3 IMPLANT
RTRCTR C-SECT PINK 25CM LRG (MISCELLANEOUS) ×3 IMPLANT
RTRCTR WOUND ALEXIS 18CM MED (MISCELLANEOUS) ×3
STRIP CLOSURE SKIN 1/2X4 (GAUZE/BANDAGES/DRESSINGS) ×2 IMPLANT
SUT MNCRL 0 VIOLET CTX 36 (SUTURE) ×2 IMPLANT
SUT MONOCRYL 0 CTX 36 (SUTURE) ×4
SUT PLAIN 2 0 XLH (SUTURE) ×3 IMPLANT
SUT VIC AB 0 CT1 27 (SUTURE) ×6
SUT VIC AB 0 CT1 27XBRD ANBCTR (SUTURE) ×2 IMPLANT
SUT VIC AB 2-0 CT1 27 (SUTURE) ×3
SUT VIC AB 2-0 CT1 TAPERPNT 27 (SUTURE) ×1 IMPLANT
SUT VIC AB 4-0 KS 27 (SUTURE) ×3 IMPLANT
TOWEL OR 17X24 6PK STRL BLUE (TOWEL DISPOSABLE) ×3 IMPLANT
TRAY FOLEY BAG SILVER LF 14FR (SET/KITS/TRAYS/PACK) ×3 IMPLANT

## 2018-01-05 NOTE — Anesthesia Postprocedure Evaluation (Signed)
Anesthesia Post Note  Patient: Traci Sweeney  Procedure(s) Performed: REPEAT CESAREAN SECTION (N/A )     Patient location during evaluation: Mother Baby Anesthesia Type: Spinal Level of consciousness: awake Pain management: satisfactory to patient Vital Signs Assessment: post-procedure vital signs reviewed and stable Respiratory status: spontaneous breathing Cardiovascular status: stable Anesthetic complications: no    Last Vitals:  Vitals:   01/05/18 1145 01/05/18 1245  BP: 111/75 (!) 116/59  Pulse: (!) 58 (!) 57  Resp: 18 18  Temp: (!) 35.6 C (!) 36.4 C  SpO2: 99% 99%    Last Pain:  Vitals:   01/05/18 1245  TempSrc: Oral  PainSc: 4    Pain Goal:                 KeyCorpBURGER,Ren Aspinall

## 2018-01-05 NOTE — Addendum Note (Signed)
Addendum  created 01/05/18 1449 by Algis GreenhouseBurger, Jaquane Boughner A, CRNA   Sign clinical note

## 2018-01-05 NOTE — Consult Note (Signed)
[facility]  Delivery Note:  C-section       01/05/2018  7:39 AM  I was called to the operating room at the request of the patient's obstetrician (Dr. Horvath) for a repeat c-section.  PRENATAL HX:  This is a 32 y/o G2P1001 at 39 and 0/[redacted] weeks gestation who presents for scheduled repeat c-section.  Pregnancy complicated by diet controlled GDM.  AROM at delivery.  DELIVERY:  Infant was vigorous at delivery, requiring no resuscitation other than standard warming, drying and stimulation.  APGARs 8 and 9.  Exam within normal limits.  After 5 minutes, baby left with nurse to assist parents with skin-to-skin care.   _____________________ Electronically Signed By: Antonia Culbertson, MD Neonatologist  

## 2018-01-05 NOTE — Lactation Note (Signed)
This note was copied from a baby's chart. Lactation Consultation Note  Patient Name: Traci Sweeney [id number]'s Date: 01/05/2018 Reason for consult: Initial assessment;Term Breastfeeding consultation services and support information given to mom.  Newborn is 6 hours old and she has been to breast twice.  Mom recently fed baby for 10 minutes.  Instructed to feed with cues and encouraged to call for assist prn.  Mom is currently very nauseated so will follow up later.  Maternal Data Does the patient have breastfeeding experience prior to this delivery?: Yes  Feeding Feeding Type: Breast Fed Length of feed: 0 min  LATCH Score                   Interventions    Lactation Tools Discussed/Used     Consult Status Consult Status: Follow-up Date: 01/06/18 Follow-up type: In-patient    Huston FoleyMOULDEN, Malik Paar S 01/05/2018, 2:09 PM

## 2018-01-05 NOTE — Transfer of Care (Signed)
Immediate Anesthesia Transfer of Care Note  Patient: Traci Sweeney  Procedure(s) Performed: REPEAT CESAREAN SECTION (N/A )  Patient Location: PACU  Anesthesia Type:Spinal  Level of Consciousness: awake  Airway & Oxygen Therapy: Patient Spontanous Breathing  Post-op Assessment: Report given to RN  Post vital signs: Reviewed and stable  Last Vitals:  Vitals Value Taken Time  BP    Temp    Pulse    Resp    SpO2      Last Pain:  Vitals:   01/05/18 0602  TempSrc: Oral  PainSc: 0-No pain         Complications: No apparent anesthesia complications

## 2018-01-05 NOTE — Brief Op Note (Signed)
01/05/2018  8:23 AM  PATIENT:  Traci Sweeney  32 y.o. female  PRE-OPERATIVE DIAGNOSIS:  REPEAT NKDA EDD: 01/12/18  POST-OPERATIVE DIAGNOSIS:  REPEAT NKDA EDD: 01/12/18  PROCEDURE:  Procedure(s): REPEAT CESAREAN SECTION (N/A)  SURGEON:  Surgeon(s) and Role:    Carrington Clamp* [name], MD - Primary  PHYSICIAN ASSISTANT:   ASSISTANTS: Ilda MoriKaplan, Richard, MD   ANESTHESIA:   spinal  EBL:  598 mL   SPECIMEN:  No Specimen  DISPOSITION OF SPECIMEN:  N/A  COUNTS:  YES  TOURNIQUET:  * No tourniquets in log *  DICTATION: .Note written in EPIC  PLAN OF CARE: Admit to inpatient   PATIENT DISPOSITION:  PACU - hemodynamically stable.   Delay start of Pharmacological VTE agent (>24hrs) due to surgical blood loss or risk of bleeding: not applicable

## 2018-01-05 NOTE — Anesthesia Postprocedure Evaluation (Signed)
Anesthesia Post Note  Patient: Traci Sweeney  Procedure(s) Performed: REPEAT CESAREAN SECTION (N/A )     Patient location during evaluation: PACU Anesthesia Type: Spinal Level of consciousness: awake and alert Pain management: pain level controlled Vital Signs Assessment: post-procedure vital signs reviewed and stable Respiratory status: spontaneous breathing and respiratory function stable Cardiovascular status: blood pressure returned to baseline and stable Postop Assessment: spinal receding Anesthetic complications: no    Last Vitals:  Vitals:   01/05/18 1045 01/05/18 1145  BP: 120/61 111/75  Pulse: 65 (!) 58  Resp: 18 18  Temp: (!) 36.1 C (!) 35.6 C  SpO2: 100% 99%    Last Pain:  Vitals:   01/05/18 1145  TempSrc: Axillary  PainSc: 4    Pain Goal:                 Baltasar Twilley

## 2018-01-05 NOTE — Op Note (Signed)
01/05/2018  8:23 AM  PATIENT:  Traci Sweeney  32 y.o. female  PRE-OPERATIVE DIAGNOSIS:  REPEAT NKDA EDD: 01/12/18  POST-OPERATIVE DIAGNOSIS:  REPEAT NKDA EDD: 01/12/18  PROCEDURE:  Procedure(s): REPEAT CESAREAN SECTION (N/Sweeney)  SURGEON:  Surgeon(s) and Role:    Carrington Clamp* Jacquese Cassarino, MD - Primary  PHYSICIAN ASSISTANT:   ASSISTANTS: Ilda MoriKaplan, Richard, MD   ANESTHESIA:   spinal  EBL:  598 mL   SPECIMEN:  No Specimen  DISPOSITION OF SPECIMEN:  N/Sweeney  COUNTS:  YES  TOURNIQUET:  * No tourniquets in log *  DICTATION: .Note written in EPIC  PLAN OF CARE: Admit to inpatient   PATIENT DISPOSITION:  PACU - hemodynamically stable.   Delay start of Pharmacological VTE agent (>24hrs) due to surgical blood loss or risk of bleeding: not applicable  Complications:  none Medications:  Ancef, Pitocin Findings:  Baby female, Apgars 8,9, weight P.   Normal tubes, ovaries and uterus seen.  Baby was skin to skin with mother after birth in the OR.  Technique:  After adequate spinal anesthesia was achieved, the patient was prepped and draped in usual sterile fashion.  Sweeney foley catheter was used to drain the bladder.  Sweeney pfannanstiel incision was made with the scalpel and carried down to the fascia with the bovie cautery. The fascia was incised in the midline with the scalpel and carried in Sweeney transverse curvilinear manner bilaterally.  The fascia was reflected superiorly and inferiorly off the rectus muscles and the muscles split in the midline.  Sweeney bowel free portion of the peritoneum was entered bluntly and then extended in Sweeney superior and inferior manner with good visualization of the bowel and bladder.  The Alexis instrument was then placed and the vesico-uterine fascia tented up and incised in Sweeney transverse curvilinear manner.  Sweeney 2 cm transverse incision was made in the upper portion of the lower uterine segment until the amnion was exposed.   The incision was extended transversely in Sweeney blunt manner.   Clear fluid was noted and the baby delivered in the vertex presentation without complication.  The baby was bulb suctioned and the cord was clamped and cut aftet stripping blood from cord into baby.  The baby was then handed to awaiting Neonatology.  The placenta was then delivered manually and the uterus cleared of all debris.  The uterine incision was then closed with Sweeney running lock stitch of 0 monocryl.  An imbricating layer of 0 monocryl was closed as well. Excellent hemostasis of the uterine incision was achieved after two additional stitches were placed in the right corner of the incision and the abdomen was cleared with irrigation.  The peritoneum was closed with Sweeney running stitch of 2-0 vicryl.  This incorporated the rectus muscles as Sweeney separate layer.  The fascia was then closed with Sweeney running stitch of 0 vicryl.  The subcutaneous layer was closed with interrupted  stitches of 2-0 plain gut.  The skin was closed with 4-0 vicryl on Sweeney Keith needle and steri-strips.  The patient tolerated the procedure well and was returned to the recovery room in stable condition.  All counts were correct times three.  Traci Sweeney

## 2018-01-05 NOTE — Anesthesia Preprocedure Evaluation (Signed)
Anesthesia Evaluation  Patient identified by MRN, date of birth, ID band Patient awake    Reviewed: Allergy & Precautions, Patient's Chart, lab work & pertinent test results  Airway Mallampati: III  TM Distance: >3 FB Neck ROM: Full    Dental no notable dental hx. (+) Teeth Intact   Pulmonary former smoker,    Pulmonary exam normal breath sounds clear to auscultation       Cardiovascular hypertension, Pt. on medications Normal cardiovascular exam Rhythm:Regular Rate:Normal     Neuro/Psych negative neurological ROS  negative psych ROS   GI/Hepatic Neg liver ROS, GERD  Medicated and Controlled,  Endo/Other  diabetesObesity  Renal/GU negative Renal ROS  negative genitourinary   Musculoskeletal negative musculoskeletal ROS (+)   Abdominal (+) + obese,   Peds  Hematology  (+) anemia ,   Anesthesia Other Findings   Reproductive/Obstetrics (+) Pregnancy HSV                             Anesthesia Physical  Anesthesia Plan  ASA: III  Anesthesia Plan: Spinal   Post-op Pain Management:    Induction:   PONV Risk Score and Plan:   Airway Management Planned: Natural Airway and Nasal Cannula  Additional Equipment:   Intra-op Plan:   Post-operative Plan:   Informed Consent: I have reviewed the patients History and Physical, chart, labs and discussed the procedure including the risks, benefits and alternatives for the proposed anesthesia with the patient or authorized representative who has indicated his/her understanding and acceptance.     Plan Discussed with: Anesthesiologist, CRNA and Surgeon  Anesthesia Plan Comments: (  )        Anesthesia Quick Evaluation

## 2018-01-05 NOTE — Anesthesia Procedure Notes (Addendum)
Spinal  Patient location during procedure: OR Start time: 01/05/2018 7:19 AM End time: 01/05/2018 7:22 AM Staffing Anesthesiologist: Bethena Midgetddono, Idaly Verret, MD Preanesthetic Checklist Completed: patient identified, site marked, surgical consent, pre-op evaluation, timeout performed, IV checked, risks and benefits discussed and monitors and equipment checked Spinal Block Patient position: sitting Prep: DuraPrep Patient monitoring: heart rate, cardiac monitor, continuous pulse ox and blood pressure Approach: midline Location: L4-5 Injection technique: single-shot Needle Needle type: Sprotte  Needle gauge: 24 G Needle length: 9 cm Assessment Sensory level: T4

## 2018-01-05 NOTE — Progress Notes (Signed)
There has been no change in the patients history, status or exam since the history and physical.  Vitals:   01/05/18 0602  Temp: 98.3 F (36.8 C)  TempSrc: Oral  Weight: 193 lb 9.6 oz (87.8 kg)  Height: 5\' 4"  (1.626 m)    Results for orders placed or performed during the hospital encounter of 01/05/18 (from the past 72 hour(s))  Glucose, capillary     Status: None   Collection Time: 01/05/18  6:19 AM  Result Value Ref Range   Glucose-Capillary 82 65 - 99 mg/dL    Traci Sweeney

## 2018-01-06 ENCOUNTER — Other Ambulatory Visit: Payer: Self-pay

## 2018-01-06 ENCOUNTER — Encounter (HOSPITAL_COMMUNITY): Payer: Self-pay

## 2018-01-06 LAB — CBC
HCT: 30.6 % — ABNORMAL LOW (ref 36.0–46.0)
HEMOGLOBIN: 10.2 g/dL — AB (ref 12.0–15.0)
MCH: 29.7 pg (ref 26.0–34.0)
MCHC: 33.3 g/dL (ref 30.0–36.0)
MCV: 89.2 fL (ref 78.0–100.0)
Platelets: 180 10*3/uL (ref 150–400)
RBC: 3.43 MIL/uL — ABNORMAL LOW (ref 3.87–5.11)
RDW: 13.8 % (ref 11.5–15.5)
WBC: 9.3 10*3/uL (ref 4.0–10.5)

## 2018-01-06 LAB — BIRTH TISSUE RECOVERY COLLECTION (PLACENTA DONATION)

## 2018-01-06 NOTE — Lactation Note (Signed)
This note was copied from a baby's chart. Lactation Consultation Note  Patient Name: Traci Rhae LernerKristy Elmes ZOXWR'UToday's Date: 01/06/2018 Reason for consult: Follow-up assessment;Nipple pain/trauma;Term   Follow up with mom of 28 hour old infant. Infant with 10 BF for 20-30 minutes, 3 voids and 7 stools in the last 24 hours. infant weight 7 pounds 1.2 ounces with 5% weight loss since birth. LATCH scores 7-8. Infant with +DAT, bilirubin level in low risk zone.   Mom reports she feels a little fuller today. Mom reports she is hand expressing and getting large gtts colostrum. Mom is experiencing nipple pain with initial latch, Enc mom to apply EBM and follow with Coconut oil post feeding, coconut oil given to mom. Mom reports infant has been cluster feeding, discussed this is normal and for mom to follow infant lead.   Family is giving a pacifier, enc them to avoid pacifiers for the first 2 weeks until milk supply well established, they left pacifier in infant mouth. Enc mom to allow infant to meet suckling needs at the breast.   Enc mom to continue feeding STS 8-12 x in 24 hours at first feeding cues. Enc mom to allow infant to cluster feed as she needs to.  Mom asked if she need to be pumping, discussed mom could if she would like to but infant is showing great output at this time. Enc mom to hand express colostrum and feed to infant also.   Mom reports she does not need any BF assistance at this time. Enc mom to call out for assistance as needed. Mom reports she has no further questions/concerns at this time.        Maternal Data Formula Feeding for Exclusion: No Has patient been taught Hand Expression?: Yes Does the patient have breastfeeding experience prior to this delivery?: Yes  Feeding Feeding Type: Breast Fed  LATCH Score                   Interventions    Lactation Tools Discussed/Used WIC Program: No   Consult Status Consult Status: Follow-up Date: 01/07/18 Follow-up  type: In-patient    Silas FloodSharon S Zahara Rembert 01/06/2018, 11:50 AM

## 2018-01-06 NOTE — Progress Notes (Signed)
Subjective: Postpartum Day 1: Cesarean Delivery Patient reports pain controlled, no nausea/vomiting. Tolerating po well  Objective: Vital signs in last 24 hours: Temp:  [96.1 F (35.6 C)-98.4 F (36.9 C)] 98.1 F (36.7 C) (04/04 0830) Pulse Rate:  [55-78] 58 (04/04 0830) Resp:  [17-20] 18 (04/04 0830) BP: (104-120)/(48-75) 104/54 (04/04 0830) SpO2:  [95 %-100 %] 99 % (04/04 0830)  Physical Exam:  General: alert, cooperative and appears stated age Lochia: appropriate Uterine Fundus: firm Incision: healing well DVT Evaluation: No evidence of DVT seen on physical exam.  Recent Labs    01/04/18 1000 01/06/18 0520  HGB 11.6* 10.2*  HCT 34.4* 30.6*    Assessment/Plan: Status post Cesarean section. Doing well postoperatively.  Continue current care.  Waynard ReedsKendra Alijah Hyde 01/06/2018, 9:42 AM

## 2018-01-07 ENCOUNTER — Encounter (HOSPITAL_COMMUNITY): Payer: Self-pay | Admitting: *Deleted

## 2018-01-07 MED ORDER — OXYCODONE-ACETAMINOPHEN 5-325 MG PO TABS: 1.0000 | ORAL_TABLET | ORAL | Status: DC | PRN

## 2018-01-07 MED ORDER — OXYCODONE-ACETAMINOPHEN 5-325 MG PO TABS: 1.0000 | ORAL_TABLET | ORAL | 0 refills | Status: DC | PRN

## 2018-01-07 NOTE — Progress Notes (Addendum)
  Patient is eating, ambulating, voiding.  Pain control is good.  Pt c/o worsening of myopia (near vision) but has perfect far vision.  No nausea, vomiting, RUQ pain, scotomata or headache.  Pt has had no elevated BPs.  Platelets yesterday 180.  Pt states her vision changed after last delivery too.    Vitals:   01/06/18 0400 01/06/18 0612 01/06/18 0830 01/06/18 1856  BP: (!) 113/57  (!) 104/54 105/66  Pulse: (!) 55  (!) 58 65  Resp: 19  18 18   Temp: (!) 97.5 F (36.4 C)  98.1 F (36.7 C) 99 F (37.2 C)  TempSrc: Oral  Axillary Oral  SpO2: 98% 96% 99%   Weight:      Height:        lungs:   clear to auscultation cor:    RRR Abdomen:  soft, appropriate tenderness, incisions intact and without erythema or exudate ex:    no cords; no hyper reflexes or clonus  Lab Results  Component Value Date   WBC 9.3 01/06/2018   HGB 10.2 (L) 01/06/2018   HCT 30.6 (L) 01/06/2018   MCV 89.2 01/06/2018   PLT 180 01/06/2018    --/--/O POS (04/02 1000)/RI  A/P    Post operative day 2.  Pt desires to go home and has no signs of preeclampsia.  Will have pt call me with update tomorrow and will call if develops any other sx.    Routine post op and postpartum care.  Expect d/c today.  Percocet for pain control.

## 2018-01-07 NOTE — Lactation Note (Signed)
This note was copied from a baby's chart. Lactation Consultation Note  Patient Name: Traci Rhae LernerKristy Sweeney [id number]'XToday's Date: 01/07/2018  Mom c/o sore nipples.  Both nipples with small abrasions.  Reviewed techniques for a wide latch.  Comfort gels given with instructions.  Mom has a pump at home and may do some pumping to rest nipples.  Lactation outpatient services and support reviewed and encouraged prn.   Maternal Data    Feeding Feeding Type: Breast Fed Length of feed: 30 min  LATCH Score Latch: Grasps breast easily, tongue down, lips flanged, rhythmical sucking.  Audible Swallowing: Spontaneous and intermittent  Type of Nipple: Everted at rest and after stimulation  Comfort (Breast/Nipple): Filling, red/small blisters or bruises, mild/mod discomfort  Hold (Positioning): Assistance needed to correctly position infant at breast and maintain latch.  LATCH Score: 8  Interventions    Lactation Tools Discussed/Used     Consult Status      Huston FoleyMOULDEN, Tashawn Greff S 01/07/2018, 10:31 AM

## 2018-01-08 NOTE — Discharge Summary (Signed)
Obstetric Discharge Summary Reason for Admission: cesarean section Prenatal Procedures: none Intrapartum Procedures: cesarean: low cervical, transverse Postpartum Procedures: none Complications-Operative and Postpartum: none Hemoglobin  Date Value Ref Range Status  01/06/2018 10.2 (L) 12.0 - 15.0 g/dL Final   Hemoglobin, fingerstick  Date Value Ref Range Status  08/02/2014 13.8 12.0 - 16.0 g/dL Final   HCT  Date Value Ref Range Status  01/06/2018 30.6 (L) 36.0 - 46.0 % Final     Discharge Diagnoses: Term Pregnancy-delivered  Discharge Information: Date: 01/08/2018 Activity: pelvic rest Diet: routine Medications: Iron and Percocet Condition: stable Instructions: refer to practice specific booklet Discharge to: home Follow-up Information    Carrington ClampHorvath, Baruc Tugwell, MD Follow up in 4 week(s).   Specialty:  Obstetrics and Gynecology Contact information: [address] [place] [address] [phone]           Newborn Data: Live born female  Birth Weight: 7 lb 6.7 oz (3365 g) APGAR: 8, 9  Newborn Delivery   Birth date/time:  01/05/2018 07:45:00 Delivery type:  C-Section, Low Transverse C-section categorization:  Repeat     Home with mother.  Madilyn Cephas A 01/08/2018, 9:42 AM

## 2018-01-12 ENCOUNTER — Encounter (HOSPITAL_COMMUNITY): Payer: Self-pay | Admitting: *Deleted

## 2018-01-20 DIAGNOSIS — B349 Viral infection, unspecified: Secondary | ICD-10-CM | POA: Diagnosis not present

## 2018-03-31 DIAGNOSIS — N6452 Nipple discharge: Secondary | ICD-10-CM | POA: Diagnosis not present

## 2018-03-31 DIAGNOSIS — Z3202 Encounter for pregnancy test, result negative: Secondary | ICD-10-CM | POA: Diagnosis not present

## 2018-04-05 ENCOUNTER — Ambulatory Visit (INDEPENDENT_AMBULATORY_CARE_PROVIDER_SITE_OTHER): Payer: 59 | Admitting: Certified Nurse Midwife

## 2018-04-05 ENCOUNTER — Encounter: Payer: Self-pay | Admitting: Certified Nurse Midwife

## 2018-04-05 ENCOUNTER — Ambulatory Visit
Admission: RE | Admit: 2018-04-05 | Discharge: 2018-04-05 | Disposition: A | Discharge status: Home / Self Care | Payer: 59 | Source: Ambulatory Visit | Attending: Obstetrics and Gynecology | Admitting: Obstetrics and Gynecology

## 2018-04-05 VITALS — BP 95/65 | HR 74 | Temp 98.6°F | Ht 64.0 in | Wt 174.5 lb

## 2018-04-05 DIAGNOSIS — N644 Mastodynia: Secondary | ICD-10-CM

## 2018-04-05 DIAGNOSIS — O9229 Other disorders of breast associated with pregnancy and the puerperium: Secondary | ICD-10-CM | POA: Diagnosis not present

## 2018-04-05 DIAGNOSIS — O924 Hypogalactia: Secondary | ICD-10-CM | POA: Diagnosis not present

## 2018-04-05 MED ORDER — FLUCONAZOLE 200 MG PO TABS: 200.0000 mg | ORAL_TABLET | Freq: Every day | ORAL | 1 refills | Status: DC

## 2018-04-05 MED ORDER — DICLOXACILLIN SODIUM 500 MG PO CAPS: 500.0000 mg | ORAL_CAPSULE | Freq: Four times a day (QID) | ORAL | 0 refills | Status: DC

## 2018-04-05 NOTE — Progress Notes (Signed)
New pt is here with poss mastitis in left breast. While pumping pt got clots and large amt of blood- no milk. Is 13 weeks post partum.

## 2018-04-05 NOTE — Progress Notes (Signed)
GYN ENCOUNTER NOTE  Subjective:       Traci Sweeney is a 32 y.o. G34P2002 female who present to the office after outpatient lactation consult for evaluation of possible mastitis.   Patient reports left breast and nipple pain with tingling and decreased milk supply.   States pumped blood from breast last Wednesday until Sunday. Advised to continuing pumping by previous provider.   History of fever and infant needing treatment for thrush.   Denies difficulty breathing or respiratory distress, chest pain, abdominal pain, fever, chills, excessive vaginal bleeding, dysuria, and leg pain or swelling.    Gynecologic History  No LMP recorded. (Menstrual status: Lactating).  Obstetric History  OB History  Gravida Para Term Preterm AB Living  2 2 2  0 0 2  SAB TAB Ectopic Multiple Live Births  0 0 0 0 2    # Outcome Date GA Lbr Len/2nd Weight Sex Delivery Anes PTL Lv  2 Term 01/05/18 [redacted]w[redacted]d  7 lb 6.7 oz (3.365 kg) F CS-LTranv Spinal  LIV  1 Term 07/17/15 [redacted]w[redacted]d  7 lb 7.2 oz (3.38 kg) F CS-LTranv EPI  LIV     Birth Comments: dilated to 9 cm    Past Medical History:  Diagnosis Date  . Gestational diabetes   . HSV-1 infection   . Inguinal hernia infant   bilateral  . UTI (lower urinary tract infection) 2006 - 2007    Dr. Sherron Monday uro eval seconday to frequent infection with negative cysto    Past Surgical History:  Procedure Laterality Date  . CESAREAN SECTION N/A 07/17/2015   Procedure: CESAREAN SECTION;  Surgeon: Essie Hart, MD;  Location: [facility];  Service: Obstetrics;  Laterality: N/A;  . CESAREAN SECTION N/A 01/05/2018   Procedure: REPEAT CESAREAN SECTION;  Surgeon: Carrington Clamp, MD;  Location: [facility]IR[facility];  Service: Obstetrics;  Laterality: N/A;  . CYSTOSCOPY  10/2005   negative  . INGUINAL HERNIA REPAIR Bilateral infant  . MYRINGOTOMY  2005  . TONSILLECTOMY  age 63  . TYMPANOPLASTY  2006    Current Outpatient Medications on File Prior to Visit  Medication Sig  Dispense Refill  . hydrocortisone (ANUSOL-HC) 25 MG suppository INSERT 1 SUPPOSITOR(Y/IES) TWICE A DAY BY RECTAL ROUTE FOR 14 DAYS.  5  . oxyCODONE-acetaminophen (PERCOCET/ROXICET) 5-325 MG tablet Take 1 tablet by mouth every 4 (four) hours as needed for moderate pain. (Patient not taking: Reported on 04/05/2018) 30 tablet 0   No current facility-administered medications on file prior to visit.     No Known Allergies  Social History   Socioeconomic History  . Marital status: Married    Spouse name: Not on file  . Number of children: 0  . Years of education: Not on file  . Highest education level: Not on file  Occupational History  . Not on file  Social Needs  . Financial resource strain: Not on file  . Food insecurity:    Worry: Not on file    Inability: Not on file  . Transportation needs:    Medical: Not on file    Non-medical: Not on file  Tobacco Use  . Smoking status: Former Smoker    Packs/day: 0.50    Years: 8.00    Pack years: 4.00    Types: Cigarettes    Last attempt to quit: 08/05/2011    Years since quitting: 6.6  . Smokeless tobacco: Never Used  Substance and Sexual Activity  . Alcohol use: No  . Drug use: No  .  Sexual activity: Yes    Partners: Male    Birth control/protection: Pill  Lifestyle  . Physical activity:    Days per week: Not on file    Minutes per session: Not on file  . Stress: Not on file  Relationships  . Social connections:    Talks on phone: Not on file    Gets together: Not on file    Attends religious service: Not on file    Active member of club or organization: Not on file    Attends meetings of clubs or organizations: Not on file    Relationship status: Not on file  . Intimate partner violence:    Fear of current or ex partner: Not on file    Emotionally abused: Not on file    Physically abused: Not on file    Forced sexual activity: Not on file  Other Topics Concern  . Not on file  Social History Narrative  . Not on file     Family History  Problem Relation Age of Onset  . Diabetes Father   . Cancer Father   . Hypertension Mother   . Heart disease Mother   . Lung cancer Paternal Grandfather        deceased  . Heart attack Paternal Grandfather [phone]       deceased  . Lymphoma Paternal Uncle        deceased  . Lung cancer Maternal Grandfather        deceased    The following portions of the patient's history were reviewed and updated as appropriate: allergies, current medications, past family history, past medical history, past social history, past surgical history and problem list.  Review of Systems  ROS negative except as noted above. Information obtained from patient and lactation consultant.   Objective:   BP 95/65   Pulse 74   Temp 98.6 F (37 C)   Ht 5\' 4"  (1.626 m)   Wt 174 lb 8 oz (79.2 kg)   Breastfeeding? Yes   BMI 29.95 kg/m    CONSTITUTIONAL: Well-developed, well-nourished female in no acute distress.   Breasts: lactation, bilateral nipple erythema with callous and cracks present; left tender to touch with fullness.  Assessment:   1. Postpartum nipple pain  - Ambulatory referral to Lactation - Anaerobic and Aerobic Culture - Anaerobic and Aerobic Culture  2. Decreased milk production  - Ambulatory referral to Lactation - Anaerobic and Aerobic Culture - Anaerobic and Aerobic Culture  3. Breast pain  - Ambulatory referral to Lactation - Anaerobic and Aerobic Culture - Anaerobic and Aerobic Culture  4. Lactating mother  - Ambulatory referral to Lactation - Anaerobic and Aerobic Culture - Anaerobic and Aerobic Culture     Plan:   Labs: see orders.  Discuss mastits treatment plan and pump hygiene techniques.   Rx: Diflucan and Dicloxacillin, see orders.   Reviewed red flag symptoms and when to call.   RTC x 2-3 week for follow up or sooner if needed.    Gunnar BullaJenkins Michelle Lawhorn, CNM [facility]    A total of 20 minutes were  spent face-to-face with the patient during this encounter and over half of that time dealt with counseling and coordination of care.

## 2018-04-05 NOTE — Patient Instructions (Signed)
Mastitis Mastitis is redness, soreness, and puffiness (inflammation) in an area of the breast. It is often caused by an infection that occurs when bacteria enter the skin. The infection is often helped by antibiotic medicine. Follow these instructions at home:  Only take medicines as told by your doctor.  If your doctor prescribed an antibiotic medicine, take it as told. Finish it even if you start to feel better.  Do not wear a tight or underwire bra. Wear a soft support bra.  Drink more fluids, especially if you have a fever.  If you are breastfeeding: ? Keep emptying the breast. . ? Keep your nipples clean and dry. ? Empty the first breast before going to the other breast. Use a breast pump if your baby is not emptying your breast. ? If you go back to work, pump your breasts while at work. ? Avoid letting your breasts get overly filled with milk (engorged). Contact a doctor if:  You have pus-like fluid leaking from your breast.  Your symptoms do not get better within 2 days. Get help right away if:  Your pain and puffiness are getting worse.  Your pain is not helped by medicine.  You have a red line going from your breast toward your armpit.  You have a fever or lasting symptoms for more than 2-3 days.  You have a fever and your symptoms suddenly get worse. This information is not intended to replace advice given to you by your health care provider. Make sure you discuss any questions you have with your health care provider. Document Released: 09/09/2009 Document Revised: 02/27/2016 Document Reviewed: 04/21/2013 Elsevier Interactive Patient Education  2017 Elsevier Inc.  Dicloxacillin capsules What is this medicine? DICLOXACILLIN (dye klox a SILL in) is a penicillin antibiotic. It is used to treat certain kinds of bacterial infections. It will not work for colds, flu, or other viral infections. This medicine may be used for other purposes; ask your health care provider or  pharmacist if you have questions. COMMON BRAND NAME(S): Dynapen What should I tell my health care provider before I take this medicine? They need to know if you have any of these conditions: -asthma -bowel disease, like colitis -eczema -kidney disease -an unusual or allergic reaction to dicloxacillin, other penicillins or antibiotics, foods, dyes, or preservatives -pregnant or trying to get pregnant -breast-feeding How should I use this medicine? Take this medicine by mouth with a glass of water. Follow the directions on your prescription label. It is best to take this medicine on an empty stomach, at least 1 hour before or 2 hours after food. Take your medicine at regular intervals. Do not take it more often than directed. Do not skip doses or stop your medicine early even if you feel better. Do not stop taking except on your doctor's advice. Talk to your pediatrician regarding the use of this medicine in children. While this drug may be prescribed for selected conditions, precautions do apply. Overdosage: If you think you have taken too much of this medicine contact a poison control center or emergency room at once. NOTE: This medicine is only for you. Do not share this medicine with others. What if I miss a dose? If you miss a dose, take it as soon as you can. If it is almost time for your next dose, take only that dose. Do not take double or extra doses. What may interact with this medicine? Do not take this medicine with any of the following medications: -tetracycline   antibiotics This medicine may also interact with the following medications: -birth control pills -probenecid -warfarin This list may not describe all possible interactions. Give your health care provider a list of all the medicines, herbs, non-prescription drugs, or dietary supplements you use. Also tell them if you smoke, drink alcohol, or use illegal drugs. Some items may interact with your medicine. What should I watch  for while using this medicine? Tell your doctor or health care professional if your symptoms do not improve or if you get new symptoms. Do not treat diarrhea with over the counter products. Contact your doctor if you have diarrhea that lasts more than 2 days or if it is severe and watery. If you are diabetic, you may get a false positive result for sugar in your urine. Check with your health care professional. Birth control pills may not work properly while you are taking this medicine. Talk to your doctor about using an extra method of birth control. What side effects may I notice from receiving this medicine? Side effects that you should report to your doctor or health care professional as soon as possible: -allergic reactions like skin rash, itching or hives, swelling of the face, lips, or tongue -black, tarry stool -difficulty breathing, wheezing -discolored tongue -fever, chills -pain or difficulty passing urine -redness, blistering, peeling or loosening of the skin, including inside the mouth -seizures -swollen joints -unusual bleeding, bruising -unusually weak or tired Side effects that usually do not require medical attention (report to your doctor or health care professional if they continue or are bothersome): -diarrhea -headache -nausea, vomiting -sore mouth -stomach upset This list may not describe all possible side effects. Call your doctor for medical advice about side effects. You may report side effects to FDA at 1-800-FDA-1088. Where should I keep my medicine? Keep out of the reach of children. Store at a room temperature of 25 degrees C (77 degrees F). Keep container tightly closed. Throw away any unused medicine after the expiration date. NOTE: This sheet is a summary. It may not cover all possible information. If you have questions about this medicine, talk to your doctor, pharmacist, or health care provider.  2018 Elsevier/Gold Standard (2008-01-10  14:55:53)  Fluconazole tablets What is this medicine? FLUCONAZOLE (floo KON na zole) is an antifungal medicine. It is used to treat certain kinds of fungal or yeast infections. This medicine may be used for other purposes; ask your health care provider or pharmacist if you have questions. COMMON BRAND NAME(S): Diflucan What should I tell my health care provider before I take this medicine? They need to know if you have any of these conditions: -history of irregular heart beat -kidney disease -an unusual or allergic reaction to fluconazole, other azole antifungals, medicines, foods, dyes, or preservatives -pregnant or trying to get pregnant -breast-feeding How should I use this medicine? Take this medicine by mouth. Follow the directions on the prescription label. Do not take your medicine more often than directed. Talk to your pediatrician regarding the use of this medicine in children. Special care may be needed. This medicine has been used in children as young as 6 months of age. Overdosage: If you think you have taken too much of this medicine contact a poison control center or emergency room at once. NOTE: This medicine is only for you. Do not share this medicine with others. What if I miss a dose? If you miss a dose, take it as soon as you can. If it is almost time for your   next dose, take only that dose. Do not take double or extra doses. What may interact with this medicine? Do not take this medicine with any of the following medications: -astemizole -certain medicines for irregular heart beat like dofetilide, dronedarone, quinidine -cisapride -erythromycin -lomitapide -other medicines that prolong the QT interval (cause an abnormal heart rhythm) -pimozide -terfenadine -thioridazine -tolvaptan -ziprasidone This medicine may also interact with the following medications: -antiviral medicines for HIV or AIDS -birth control pills -certain antibiotics like rifabutin,  rifampin -certain medicines for blood pressure like amlodipine, isradipine, felodipine, hydrochlorothiazide, losartan, nifedipine -certain medicines for cancer like cyclophosphamide, vinblastine, vincristine -certain medicines for cholesterol like atorvastatin, lovastatin, fluvastatin, simvastatin -certain medicines for depression, anxiety, or psychotic disturbances like amitriptyline, midazolam, nortriptyline, triazolam -certain medicines for diabetes like glipizide, glyburide, tolbutamide -certain medicines for pain like alfentanil, fentanyl, methadone -certain medicines for seizures like carbamazepine, phenytoin -certain medicines that treat or prevent blood clots like warfarin -halofantrine -medicines that lower your chance of fighting infection like cyclosporine, prednisone, tacrolimus -NSAIDS, medicines for pain and inflammation, like celecoxib, diclofenac, flurbiprofen, ibuprofen, meloxicam, naproxen -other medicines for fungal infections -sirolimus -theophylline -tofacitinib This list may not describe all possible interactions. Give your health care provider a list of all the medicines, herbs, non-prescription drugs, or dietary supplements you use. Also tell them if you smoke, drink alcohol, or use illegal drugs. Some items may interact with your medicine. What should I watch for while using this medicine? Visit your doctor or health care professional for regular checkups. If you are taking this medicine for a long time you may need blood work. Tell your doctor if your symptoms do not improve. Some fungal infections need many weeks or months of treatment to cure. Alcohol can increase possible damage to your liver. Avoid alcoholic drinks. If you have a vaginal infection, do not have sex until you have finished your treatment. You can wear a sanitary napkin. Do not use tampons. Wear freshly washed cotton, not synthetic, panties. What side effects may I notice from receiving this  medicine? Side effects that you should report to your doctor or health care professional as soon as possible: -allergic reactions like skin rash or itching, hives, swelling of the lips, mouth, tongue, or throat -dark urine -feeling dizzy or faint -irregular heartbeat or chest pain -redness, blistering, peeling or loosening of the skin, including inside the mouth -trouble breathing -unusual bruising or bleeding -vomiting -yellowing of the eyes or skin Side effects that usually do not require medical attention (report to your doctor or health care professional if they continue or are bothersome): -changes in how food tastes -diarrhea -headache -stomach upset or nausea This list may not describe all possible side effects. Call your doctor for medical advice about side effects. You may report side effects to FDA at 1-800-FDA-1088. Where should I keep my medicine? Keep out of the reach of children. Store at room temperature below 30 degrees C (86 degrees F). Throw away any medicine after the expiration date. NOTE: This sheet is a summary. It may not cover all possible information. If you have questions about this medicine, talk to your doctor, pharmacist, or health care provider.  2018 Elsevier/Gold Standard (2013-04-29 19:37:38)   

## 2018-04-05 NOTE — Lactation Note (Signed)
Lactation Consultation Note  Patient Name: Traci Sweeney ZOXWR'UToday's Date: 04/05/2018     Maternal Data    Feeding    LATCH Score                   Interventions    Lactation Tools Discussed/Used     Consult Status  Patient seen in outpatient lactation and states that she had developed mastitis when infant was 414 weeks old but was never treated and has been periodically having bloody discharge for a couple of weeks. Infant is now 2612 weeks old and patient states that she she pumped a bag of bright red blood with nickel sized blood clots this this past Wednesday and again on Friday. Patient also states that she experienced shooting pain in her breast and infant had white patches on tongue. Infant was given medicine for thrush but patient states that she was not treated for thrush. LC referred patient to another practice within our health system to be seen immediately so the infection does not turn into an abscess due to her passing blood clots in her breast.     Arlyss GandyAlicia Priscilla Kirstein 04/05/2018, 3:47 PM

## 2018-04-06 ENCOUNTER — Other Ambulatory Visit: Payer: Self-pay | Admitting: Obstetrics and Gynecology

## 2018-04-06 DIAGNOSIS — N6452 Nipple discharge: Secondary | ICD-10-CM

## 2018-04-10 LAB — ANAEROBIC AND AEROBIC CULTURE

## 2018-04-19 ENCOUNTER — Ambulatory Visit (INDEPENDENT_AMBULATORY_CARE_PROVIDER_SITE_OTHER): Payer: 59 | Admitting: Certified Nurse Midwife

## 2018-04-19 ENCOUNTER — Encounter: Payer: Self-pay | Admitting: Certified Nurse Midwife

## 2018-04-19 VITALS — BP 110/75 | HR 65 | Ht 64.0 in | Wt 173.4 lb

## 2018-04-19 DIAGNOSIS — Z87898 Personal history of other specified conditions: Secondary | ICD-10-CM | POA: Diagnosis not present

## 2018-04-19 DIAGNOSIS — G8929 Other chronic pain: Secondary | ICD-10-CM | POA: Diagnosis not present

## 2018-04-19 DIAGNOSIS — M549 Dorsalgia, unspecified: Secondary | ICD-10-CM | POA: Diagnosis not present

## 2018-04-19 NOTE — Progress Notes (Signed)
GYN ENCOUNTER NOTE  Subjective:       Traci Sweeney is a 32 y.o. [id number] female here for follow up visit after completing treatment for mastitis.   Reports relief of symptoms since completing course of antibiotic and antifungal. Supplementing infant at this time. Discontinued pumping left breast during treatment due to lack of milk. Seeking advise regarding continuation or cessation of pumping.   Patient also reports bilateral side and upper back pain when resting with noted increased work of breathing for the last three (3) years. No relief with home treatment measures.   Denies difficulty breathing or respiratory distress, chest pain, abdominal pain, vaginal bleeding, dysuria, and leg pain or swelling.    Gynecologic History  No LMP recorded. (Menstrual status: Lactating).  Obstetric History  OB History  Gravida Para Term Preterm AB Living  2 2 2  0 0 2  SAB TAB Ectopic Multiple Live Births  0 0 0 0 2    # Outcome Date GA Lbr Len/2nd Weight Sex Delivery Anes PTL Lv  2 Term 01/05/18 [id number]  7 lb 6.7 oz (3.365 kg) F CS-LTranv Spinal  LIV  1 Term 07/17/15 [id number]  7 lb 7.2 oz (3.38 kg) F CS-LTranv EPI  LIV     Birth Comments: dilated to 9 cm    Past Medical History:  Diagnosis Date  . Gestational diabetes   . HSV-1 infection   . Inguinal hernia infant   bilateral  . UTI (lower urinary tract infection) 2006 - 2007    Dr. Sherron MondayMacDiarmid uro eval seconday to frequent infection with negative cysto    Past Surgical History:  Procedure Laterality Date  . CESAREAN SECTION N/A 07/17/2015   Procedure: CESAREAN SECTION;  Surgeon: Essie HartWalda Pinn, MD;  Location: [facility];  Service: Obstetrics;  Laterality: N/A;  . CESAREAN SECTION N/A 01/05/2018   Procedure: REPEAT CESAREAN SECTION;  Surgeon: Carrington ClampHorvath, Michelle, MD;  [facility];  Service: Obstetrics;  Laterality: N/A;  . CYSTOSCOPY  10/2005   negative  . INGUINAL HERNIA REPAIR Bilateral infant  . MYRINGOTOMY  2005  .  TONSILLECTOMY  age 32  . TYMPANOPLASTY  2006    Current Outpatient Medications on File Prior to Visit  Medication Sig Dispense Refill  . hydrocortisone (ANUSOL-HC) 25 MG suppository INSERT 1 SUPPOSITOR(Y/IES) TWICE A DAY BY RECTAL ROUTE FOR 14 DAYS.  5   No current facility-administered medications on file prior to visit.     No Known Allergies  Social History   Socioeconomic History  . Marital status: Married    Spouse name: Not on file  . Number of children: 0  . Years of education: Not on file  . Highest education level: Not on file  Occupational History  . Not on file  Social Needs  . Financial resource strain: Not on file  . Food insecurity:    Worry: Not on file    Inability: Not on file  . Transportation needs:    Medical: Not on file    Non-medical: Not on file  Tobacco Use  . Smoking status: Former Smoker    Packs/day: 0.50    Years: 8.00    Pack years: 4.00    Types: Cigarettes    Last attempt to quit: 08/05/2011    Years since quitting: 6.7  . Smokeless tobacco: Never Used  Substance and Sexual Activity  . Alcohol use: No  . Drug use: No  . Sexual activity: Yes    Partners: Male  Birth control/protection: Pill  Lifestyle  . Physical activity:    Days per week: Not on file    Minutes per session: Not on file  . Stress: Not on file  Relationships  . Social connections:    Talks on phone: Not on file    Gets together: Not on file    Attends religious service: Not on file    Active member of club or organization: Not on file    Attends meetings of clubs or organizations: Not on file    Relationship status: Not on file  . Intimate partner violence:    Fear of current or ex partner: Not on file    Emotionally abused: Not on file    Physically abused: Not on file    Forced sexual activity: Not on file  Other Topics Concern  . Not on file  Social History Narrative  . Not on file    Family History  Problem Relation Age of Onset  . Diabetes  Father   . Cancer Father   . Hypertension Mother   . Heart disease Mother   . Lung cancer Paternal Grandfather        deceased  . Heart attack Paternal Grandfather 33       deceased  . Lymphoma Paternal Uncle        deceased  . Lung cancer Maternal Grandfather        deceased    The following portions of the patient's history were reviewed and updated as appropriate: allergies, current medications, past family history, past medical history, past social history, past surgical history and problem list.  Review of Systems  ROS negative except as noted above. Information obtained from patient.   Objective:   BP 110/75   Pulse 65   Ht 5\' 4"  (1.626 m)   Wt 173 lb 7 oz (78.7 kg)   BMI 29.77 kg/m   CONSTITUTIONAL: Well-developed, well-nourished female in no acute distress.   BREASTS: lactating, no erythema or tenderness, nipples normal.  Assessment:   1. H/O mastitis - Ambulatory referral to Physical Therapy - Ambulatory referral to Lactation  2. Other chronic back pain   3. Care and examination of lactating mother - Ambulatory referral to Physical Therapy - Ambulatory referral to Lactation   Plan:   Discussed options for pumping and infant supplementation as needed. Will consult Werner Lean, lactation consult, for additional information since she referred patient to office for treatment.   Encouraged follow up with Lactation, see orders.   Referral to Physical Therapy, see orders.   Reviewed red flag symptoms and when to call.   RTC as needed.    Gunnar Bulla, CNM [facility], [facility]

## 2018-04-19 NOTE — Progress Notes (Signed)
Pt is here for a recheck on left breast. Has not had any more blood or pain in that breast.

## 2018-04-19 NOTE — Patient Instructions (Signed)
Breast Pumping Tips °If you are breastfeeding, there may be times when you cannot feed your baby directly. Returning to work or going on a trip are common examples. Pumping allows you to store breast milk and feed it to your baby later. °You may not get much milk when you first start to pump. Your breasts should start to make more after a few days. If you pump at the times you usually feed your baby, you may be able to keep making enough milk to feed your baby without also using formula. The more often you pump, the more milk you will produce. °When should I pump? °· You can begin to pump soon after delivery. However, some experts recommend waiting about 4 weeks before giving your infant a bottle to make sure breastfeeding is going well. °· If you plan to return to work, begin pumping a few weeks before. This will help you develop techniques that work best for you. It also lets you build up a supply of breast milk. °· When you are with your infant, feed on demand and pump after each feeding. °· When you are away from your infant for several hours, pump for about 15 minutes every 2-3 hours. Pump both breasts at the same time if you can. °· If your infant has a formula feeding, make sure to pump around the same time. °· If you drink any alcohol, wait 2 hours before pumping. °How do I prepare to pump? °Your let-down reflex is the natural reaction to stimulation that makes your breast milk flow. It is easier to stimulate this reflex when you are relaxed. Find relaxation techniques that work for you. If you have difficulty with your let-down reflex, try these methods: °· Smell one of your infant's blankets or an item of clothing. °· Look at a picture or video of your infant. °· Sit in a quiet, private space. °· Massage the breast you plan to pump. °· Place soothing warmth on the breast. °· Play relaxing music. ° °What are some general breast pumping tips? °· Wash your hands before you pump. You do not need to wash your  nipples or breasts. °· There are three ways to pump. °? You can use your hand to massage and compress your breast. °? You can use a handheld manual pump. °? You can use an electric pump. °· Make sure the suction cup (flange) on the breast pump is the right size. Place the flange directly over the nipple. If it is the wrong size or placed the wrong way, it may be painful and cause nipple damage. °· If pumping is uncomfortable, apply a small amount of purified or modified lanolin to your nipple and areola. °· If you are using an electric pump, adjust the speed and suction power to be more comfortable. °· If pumping is painful or if you are not getting very much milk, you may need a different type of pump. A lactation consultant can help you determine what type of pump to use. °· Keep a full water bottle near you at all times. Drinking lots of fluid helps you make more milk. °· You can store your milk to use later. Pumped breast milk can be stored in a sealable, sterile container or plastic bag. Label all stored breast milk with the date you pumped it. °? Milk can stay out at room temperature for up to 8 hours. °? You can store your milk in the refrigerator for up to 8 days. °? You can   store your milk in the freezer for 3 months. Thaw frozen milk using warm water. Do not put it in the microwave. °· Do not smoke. Smoking can lower your milk supply and harm your infant. If you need help quitting, ask your health care provider to recommend a program. °When should I call my health care provider or a lactation consultant? °· You are having trouble pumping. °· You are concerned that you are not making enough milk. °· You have nipple pain, soreness, or redness. °· You want to use birth control. Birth control pills may lower your milk supply. Talk to your health care provider about your options. °This information is not intended to replace advice given to you by your health care provider. Make sure you discuss any questions  you have with your health care provider. °Document Released: 03/11/2010 Document Revised: 03/04/2016 Document Reviewed: 07/14/2013 °Elsevier Interactive Patient Education © 2017 Elsevier Inc. ° °

## 2018-07-21 DIAGNOSIS — N925 Other specified irregular menstruation: Secondary | ICD-10-CM | POA: Diagnosis not present

## 2018-07-21 DIAGNOSIS — Z3202 Encounter for pregnancy test, result negative: Secondary | ICD-10-CM | POA: Diagnosis not present

## 2018-07-21 DIAGNOSIS — Z3480 Encounter for supervision of other normal pregnancy, unspecified trimester: Secondary | ICD-10-CM | POA: Diagnosis not present

## 2018-07-28 ENCOUNTER — Ambulatory Visit: Payer: Self-pay | Admitting: Physician Assistant

## 2018-07-28 ENCOUNTER — Encounter: Payer: Self-pay | Admitting: Physician Assistant

## 2018-07-28 VITALS — BP 110/78 | HR 79 | Temp 98.0°F | Wt 177.0 lb

## 2018-07-28 DIAGNOSIS — L0201 Cutaneous abscess of face: Secondary | ICD-10-CM

## 2018-07-28 MED ORDER — CLINDAMYCIN HCL 300 MG PO CAPS: 300.0000 mg | ORAL_CAPSULE | Freq: Three times a day (TID) | ORAL | 0 refills | Status: AC

## 2018-07-28 MED ORDER — CHLORHEXIDINE GLUCONATE 4 % EX LIQD: Freq: Every day | CUTANEOUS | 0 refills | Status: AC | PRN

## 2018-07-28 NOTE — Progress Notes (Signed)
Patient ID: Traci Sweeney DOB: Dec 12, 1985 AGE: 32 y.o. MRN: [id number]   PCP: Patient, No Pcp Per   Chief Complaint:  Chief Complaint  Patient presents with  . save-facial isse left side cheek     Subjective:    HPI:  Traci Sweeney is a 32 y.o. female presents for evaluation  Chief Complaint  Patient presents with  . save-facial isse left side cheek    32 year old female presents with five day history of pimple on left cheek. Began as small erythematous papule. Has enlarged in size (bigger yesterday, smaller today). Associated soreness/tenderness. Has applied warm compresses and taken OTC Tylenol with no improvement. Has applied leftover Mupirocin ointment with no improvement. Denies fever, chills, headache, body aches, ear pain, URI symptoms, gum bleeding/swelling, purulent drainage from mass, nausea/vomiting.  Patient with no personal previous history of abscess. Patient states her husband was recently treated for abscess on left knee (~4 weeks ago). Patient states her 5 month old child was recently treated for abscess on buttocks (~2 weeks ago). Child had just completed antibiotic for otitis media; so was treated with only topical Mupirocin ointment. Both abscesses, husband's and child's, have resolved.  Patient is currently [redacted] weeks pregnant. Healthy pregnancy. No complications/issues. Being managed by [facility].    Patient with recent mastitis; in left breast. Diagnosed 04/05/2018. Treated successfully with dicloxacillin.  A complete, at least 10 system review of symptoms was performed, pertinent positives and negatives as mentioned in HPI, otherwise negative.  The following portions of the patient's history were reviewed and updated as appropriate: allergies, current medications and past medical history.  Patient Active Problem List   Diagnosis Date Noted  . Postoperative state 01/05/2018  . Impaired glucose tolerance test 10/29/2017  . S/P cesarean section  07/17/2015  . Gestational hypertension 07/15/2015    No Known Allergies  Current Outpatient Medications on File Prior to Visit  Medication Sig Dispense Refill  . Prenatal Vit-Fe Fumarate-FA (PRENATAL 1+1 PO) Prenatal     No current facility-administered medications on file prior to visit.        Objective:    Vitals:   07/28/18 1019  BP: 110/78  Pulse: 79  Temp: 98 F (36.7 C)  SpO2: 99%     Wt Readings from Last 3 Encounters:  07/28/18 177 lb (80.3 kg)  04/19/18 173 lb 7 oz (78.7 kg)  04/05/18 174 lb 8 oz (79.2 kg)    Physical Exam:   General Appearance:  Alert, cooperative, no distress, appears stated age. Afebrile. In no acute distress. Does not appear acutely ill.  Head:  Normocephalic, without obvious abnormality, atraumatic  Eyes:  PERRL, conjunctiva/corneas clear, EOM's intact, fundi benign, both eyes  Ears:  Normal TM's and external ear canals, both ears  Nose: Nares normal, septum midline,mucosa normal, no drainage or sinus tenderness  Throat: Lips, mucosa, and tongue normal; teeth and gums normal  Neck: Supple, symmetrical, trachea midline, no adenopathy;  thyroid: not enlarged, symmetric, no tenderness/mass/nodules; no carotid bruit or JVD  Back:   Symmetric, no curvature, ROM normal, no CVA tenderness  Lungs:   Clear to auscultation bilaterally, respirations unlabored  Heart:  Regular rate and rhythm, S1 and S2 normal, no murmur, rub, or gallop  Abdomen:   Soft, non-tender, bowel sounds active all four quadrants,  no masses, no organomegaly  Extremities: Extremities normal, atraumatic, no cyanosis or edema  Pulses: 2+ and symmetric  Skin: Skin color, texture, turgor normal, no rashes or lesions.  Left cheek reveals 1cm erythematous pustule. Pinpoint dried scab in center. No streaking redness. No surrounding erythema. No significant edema. Moderate tenderness with palpation. Firm/indurated on palpation. No purulent drainage or bleeding with manual pressure.   Lymph nodes: Cervical, supraclavicular, and axillary nodes normal  Neurologic: Normal    Assessment & Plan:    Exam findings, diagnosis etiology and medication use and indications reviewed with patient. Follow-Up and discharge instructions provided. No emergent/urgent issues found on exam.  Patient education was provided.   Patient verbalized understanding of information provided and agrees with plan of care (POC), all questions answered. The patient is advised to call or return to clinic if condition does not see an improvement in symptoms, or to seek the care of the closest emergency department if condition worsens with the above plan.   1. Facial abscess  - clindamycin (CLEOCIN) 300 MG capsule; Take 1 capsule (300 mg total) by mouth 3 (three) times daily for 7 days.  Dispense: 21 capsule; Refill: 0 - chlorhexidine (HIBICLENS) 4 % external liquid; Apply topically daily as needed for up to 7 days. Wash body with chlorhexidine, once a day, for a few days.  Dispense: 236 mL; Refill: 0   Patient with abscess on left cheek. Exposure in home to staph/MRSA. Will treat with topical mupirocin (patient has leftover from child) and oral clindamycin (category B for pregnancy and L2 for lactation - however, approved for mastitis treatment). Advised warm compresses and Tylenol for pain. Provided HibiClens for patient and patient's husband to use as body wash, help decrease staph infections. Patient will f/u with PCP or urgent care or [facility] in a few days if not improving.    Rulon Sera, MHS, PA-C Advanced Practice Provider [facility]  [address], [facility], [address]st Floor [place], [place] [address] (p):  [phone] [email or web address]

## 2018-07-28 NOTE — Patient Instructions (Addendum)
Thank you for choosing [facility] for your health care needs.  You have been diagnosed with a facial abscess (a boil / pocket of infection). You have been prescribed Clindamycin 300mg : 1 tablet three times a day. Take with food to prevent stomach upset. May use over the counter probiotic or eat yogurt to replace body's good bacteria.  Apply warm/hot compress. 15-20 minutes at a time, several times a day.  May use child's previously prescribed Mupirocin ointment: thin layer up to three times a day.  May take Tylenol for pain/discomfort.  You have been prescribed HibiClens wash. May use as soap in shower, once a day, for a few days. Husband may also use.  Follow-up with family physician or at [facility] in 2-3 days if not improving.  Skin Abscess  A skin abscess is an infected area on or under your skin that contains pus and other material. An abscess can happen almost anywhere on your body. Some abscesses break open (rupture) on their own. Most continue to get worse unless they are treated. The infection can spread deeper into the body and into your blood, which can make you feel sick. Treatment usually involves draining the abscess. Follow these instructions at home: Abscess Care  If you have an abscess that has not drained, place a warm, clean, wet washcloth over the abscess several times a day. Do this as told by your doctor.  Follow instructions from your doctor about how to take care of your abscess. Make sure you: ? Cover the abscess with a bandage (dressing). ? Change your bandage or gauze as told by your doctor. ? Wash your hands with soap and water before you change the bandage or gauze. If you cannot use soap and water, use hand sanitizer.  Check your abscess every day for signs that the infection is getting worse. Check for: ? More redness, swelling, or pain. ? More fluid or blood. ? Warmth. ? More pus or a bad smell. Medicines   Take over-the-counter and prescription  medicines only as told by your doctor.  If you were prescribed an antibiotic medicine, take it as told by your doctor. Do not stop taking the antibiotic even if you start to feel better. General instructions  To avoid spreading the infection: ? Do not share personal care items, towels, or hot tubs with others. ? Avoid making skin-to-skin contact with other people.  Keep all follow-up visits as told by your doctor. This is important. Contact a doctor if:  You have more redness, swelling, or pain around your abscess.  You have more fluid or blood coming from your abscess.  Your abscess feels warm when you touch it.  You have more pus or a bad smell coming from your abscess.  You have a fever.  Your muscles ache.  You have chills.  You feel sick. Get help right away if:  You have very bad (severe) pain.  You see red streaks on your skin spreading away from the abscess. This information is not intended to replace advice given to you by your health care provider. Make sure you discuss any questions you have with your health care provider. Document Released: 03/09/2008 Document Revised: 05/17/2016 Document Reviewed: 07/31/2015 Elsevier Interactive Patient Education  [employer].

## 2018-08-01 ENCOUNTER — Telehealth: Payer: Self-pay | Admitting: Emergency Medicine

## 2018-08-01 NOTE — Telephone Encounter (Signed)
Left message follow-up call from [facility] visit with provider 

## 2018-08-19 DIAGNOSIS — Z369 Encounter for antenatal screening, unspecified: Secondary | ICD-10-CM | POA: Diagnosis not present

## 2018-08-19 DIAGNOSIS — Z3480 Encounter for supervision of other normal pregnancy, unspecified trimester: Secondary | ICD-10-CM | POA: Diagnosis not present

## 2018-09-15 DIAGNOSIS — Z369 Encounter for antenatal screening, unspecified: Secondary | ICD-10-CM | POA: Diagnosis not present

## 2018-09-15 DIAGNOSIS — Z3482 Encounter for supervision of other normal pregnancy, second trimester: Secondary | ICD-10-CM | POA: Diagnosis not present

## 2018-10-05 HISTORY — PX: SALPINGECTOMY: SHX328

## 2018-10-14 DIAGNOSIS — Z363 Encounter for antenatal screening for malformations: Secondary | ICD-10-CM | POA: Diagnosis not present

## 2018-11-10 DIAGNOSIS — Z369 Encounter for antenatal screening, unspecified: Secondary | ICD-10-CM | POA: Diagnosis not present

## 2018-11-23 DIAGNOSIS — Z369 Encounter for antenatal screening, unspecified: Secondary | ICD-10-CM | POA: Diagnosis not present

## 2018-11-23 DIAGNOSIS — Z3480 Encounter for supervision of other normal pregnancy, unspecified trimester: Secondary | ICD-10-CM | POA: Diagnosis not present

## 2018-12-08 DIAGNOSIS — O99810 Abnormal glucose complicating pregnancy: Secondary | ICD-10-CM | POA: Diagnosis not present

## 2018-12-12 DIAGNOSIS — H5213 Myopia, bilateral: Secondary | ICD-10-CM | POA: Diagnosis not present

## 2018-12-14 ENCOUNTER — Ambulatory Visit: Payer: 59

## 2018-12-15 DIAGNOSIS — Z23 Encounter for immunization: Secondary | ICD-10-CM | POA: Diagnosis not present

## 2018-12-21 ENCOUNTER — Ambulatory Visit: Payer: 59

## 2018-12-22 ENCOUNTER — Encounter: Payer: Self-pay | Admitting: Dietician

## 2018-12-22 ENCOUNTER — Other Ambulatory Visit: Payer: Self-pay

## 2018-12-22 ENCOUNTER — Encounter: Payer: 59 | Attending: Obstetrics and Gynecology | Admitting: Dietician

## 2018-12-22 DIAGNOSIS — O24410 Gestational diabetes mellitus in pregnancy, diet controlled: Secondary | ICD-10-CM | POA: Insufficient documentation

## 2018-12-22 NOTE — Progress Notes (Signed)
Medical Nutrition Therapy:  Appt start time: 1615 end time:  1700.   Assessment:  Primary concerns today: Patient is here today alone for a review for GDM.  She attended GDM class last year with her last pregnancy. She has a meter from last year and has not started testing.  She needs to call her MD to get a prescription for more strips and lancets. BG in the office today was 76 at 4:30 and she last ate 4 hours ago. She has been following a higher protein, vegetables, and low carbohydrate diet. Weight is 192 lbs, Prepregnancy about 180 lbs and currently is [redacted] weeks gestation. She had a normal pregnancy with her first child and was diagnosed with GDM during her second pregnancy as well as current.  She did breastfeed but had complications with mastitis with last pregnancy.   Patient lives with her husband, 52 yo and almost 1 yo.  She does the shopping and cooking.  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  She lives about 45 minutes from work and she finds that the commute to help lesson stress.  Preferred Learning Style:   No preference indicated   Learning Readiness:   Ready  Change in progress   MEDICATIONS: prenatal vitamin   DIETARY INTAKE:  Usual eating pattern includes 2-3 meals and 0 snacks per day.  24-hr recall:  B ( AM): egg and cheese on english muffin OR skips  Snk ( AM): none  L ( PM): salad OR wrap (from [facility] cafeteria) Snk ( PM): none D ( PM): spaghetti OR grilled chicken, baked sweet potato, vegetable, corn Snk ( PM): none Beverages: water, occasional coffee with french vanilla creamer  Usual physical activity: increased walking with her job and some in the neighborhood  Progress Towards Goal(s):  In progress.   Nutritional Diagnosis:  NB-1.1 Food and nutrition-related knowledge deficit As related to balance of carbohydrates, protein, and fat.  As evidenced by diet hx and patient report.    Intervention:   Nutrition review on nutrition and BG goals for GDM.  Reviewed pregnancy needs of 175 grams of carbs daily (spread between 3 meals and snacks),  Patient able to verbalize.  Patient with increased stress currently.  Discussed importance of stress management and need for BG control.  Discussed family meals (which cause patient stress) and her role and suggested a book to help guide her with family meals.  Discussed benefits of breast feeding.  Patient plans to nurse.    Consider reading [employer] (books on child feeding) 3 meals and 2 small snacks daily Aim for 3-4 servings of carbohydrate (45-60 grams) per meal And 0-1 carbohydrate if you are hungry between meals. Add breakfast daily Continue to stay active Check your blood sugar 4 times daily (before breakfast and 2 hours after the start of each meal) Call your MD for prescriptions for lancets and needles for your FreeStyle Lite meter.  Consider calling your insurance to see if that meter is still preferred.  Teaching Method Utilized:  Visual Auditory Hands on  Handouts given during visit include:  Diabetes during pregnancy packet  Yellow meal plan card  Barriers to learning/adherence to lifestyle change: stress, time  Demonstrated degree of understanding via:  Teach Back   Monitoring/Evaluation:  Dietary intake, exercise, and body weight prn.

## 2018-12-22 NOTE — Patient Instructions (Signed)
Consider reading [employer] (books on child feeding) 3 meals and 2 small snacks daily Aim for 3-4 servings of carbohydrate (45-60 grams) per meal And 0-1 carbohydrate if you are hungry between meals. Add breakfast daily Continue to stay active Check your blood sugar 4 times daily (before breakfast and 2 hours after the start of each meal) Call your MD for prescriptions for lancets and needles for your FreeStyle Lite meter.  Consider calling your insurance to see if that meter is still preferred.

## 2018-12-29 DIAGNOSIS — Z369 Encounter for antenatal screening, unspecified: Secondary | ICD-10-CM | POA: Diagnosis not present

## 2019-01-11 ENCOUNTER — Other Ambulatory Visit: Payer: Self-pay | Admitting: Obstetrics and Gynecology

## 2019-01-19 DIAGNOSIS — Z369 Encounter for antenatal screening, unspecified: Secondary | ICD-10-CM | POA: Diagnosis not present

## 2019-02-01 DIAGNOSIS — Z3480 Encounter for supervision of other normal pregnancy, unspecified trimester: Secondary | ICD-10-CM | POA: Diagnosis not present

## 2019-02-01 DIAGNOSIS — O26843 Uterine size-date discrepancy, third trimester: Secondary | ICD-10-CM | POA: Diagnosis not present

## 2019-02-01 DIAGNOSIS — Z369 Encounter for antenatal screening, unspecified: Secondary | ICD-10-CM | POA: Diagnosis not present

## 2019-02-08 DIAGNOSIS — Z369 Encounter for antenatal screening, unspecified: Secondary | ICD-10-CM | POA: Diagnosis not present

## 2019-02-09 ENCOUNTER — Inpatient Hospital Stay (HOSPITAL_COMMUNITY): Payer: 59 | Admitting: Anesthesiology

## 2019-02-09 ENCOUNTER — Encounter (HOSPITAL_COMMUNITY)
Admission: AD | Disposition: A | Discharge status: Home / Self Care | Payer: Self-pay | Source: Home / Self Care | Attending: Obstetrics and Gynecology

## 2019-02-09 ENCOUNTER — Other Ambulatory Visit: Payer: Self-pay

## 2019-02-09 ENCOUNTER — Encounter (HOSPITAL_COMMUNITY): Payer: Self-pay

## 2019-02-09 ENCOUNTER — Inpatient Hospital Stay (HOSPITAL_COMMUNITY): Admit: 2019-02-09 | Payer: 59 | Admitting: Obstetrics and Gynecology

## 2019-02-09 ENCOUNTER — Inpatient Hospital Stay (HOSPITAL_COMMUNITY)
Admission: AD | Admit: 2019-02-09 | Discharge: 2019-02-11 | DRG: 785 | Disposition: A | Discharge status: Home / Self Care | Payer: 59 | Attending: Obstetrics and Gynecology | Admitting: Obstetrics and Gynecology

## 2019-02-09 DIAGNOSIS — Z302 Encounter for sterilization: Secondary | ICD-10-CM

## 2019-02-09 DIAGNOSIS — O99214 Obesity complicating childbirth: Secondary | ICD-10-CM | POA: Diagnosis present

## 2019-02-09 DIAGNOSIS — O24420 Gestational diabetes mellitus in childbirth, diet controlled: Secondary | ICD-10-CM | POA: Diagnosis present

## 2019-02-09 DIAGNOSIS — O134 Gestational [pregnancy-induced] hypertension without significant proteinuria, complicating childbirth: Principal | ICD-10-CM | POA: Diagnosis present

## 2019-02-09 DIAGNOSIS — Z3A37 37 weeks gestation of pregnancy: Secondary | ICD-10-CM

## 2019-02-09 DIAGNOSIS — R030 Elevated blood-pressure reading, without diagnosis of hypertension: Secondary | ICD-10-CM | POA: Diagnosis present

## 2019-02-09 DIAGNOSIS — Z03818 Encounter for observation for suspected exposure to other biological agents ruled out: Secondary | ICD-10-CM | POA: Diagnosis not present

## 2019-02-09 DIAGNOSIS — Z87891 Personal history of nicotine dependence: Secondary | ICD-10-CM | POA: Diagnosis not present

## 2019-02-09 DIAGNOSIS — O34211 Maternal care for low transverse scar from previous cesarean delivery: Secondary | ICD-10-CM | POA: Diagnosis not present

## 2019-02-09 DIAGNOSIS — Z369 Encounter for antenatal screening, unspecified: Secondary | ICD-10-CM | POA: Diagnosis not present

## 2019-02-09 LAB — COMPREHENSIVE METABOLIC PANEL
ALT: 12 U/L (ref 0–44)
AST: 26 U/L (ref 15–41)
Albumin: 2.8 g/dL — ABNORMAL LOW (ref 3.5–5.0)
Alkaline Phosphatase: 130 U/L — ABNORMAL HIGH (ref 38–126)
Anion gap: 10 (ref 5–15)
BUN: 8 mg/dL (ref 6–20)
CO2: 18 mmol/L — ABNORMAL LOW (ref 22–32)
Calcium: 8.6 mg/dL — ABNORMAL LOW (ref 8.9–10.3)
Chloride: 108 mmol/L (ref 98–111)
Creatinine, Ser: 0.57 mg/dL (ref 0.44–1.00)
GFR calc Af Amer: >60 mL/min (ref >60)
GFR calc non Af Amer: >60 mL/min (ref >60)
Glucose, Bld: 71 mg/dL (ref 70–99)
Potassium: 4.6 mmol/L (ref 3.5–5.1)
Sodium: 136 mmol/L (ref 135–145)
Total Bilirubin: 0.9 mg/dL (ref 0.3–1.2)
Total Protein: 5.4 g/dL — ABNORMAL LOW (ref 6.5–8.1)

## 2019-02-09 LAB — TYPE AND SCREEN
ABO/RH(D): O POS
Antibody Screen: NEGATIVE

## 2019-02-09 LAB — CBC WITH DIFFERENTIAL/PLATELET
Abs Immature Granulocytes: 0.05 10*3/uL (ref 0.00–0.07)
Basophils Absolute: 0 10*3/uL (ref 0.0–0.1)
Basophils Relative: 0 %
Eosinophils Absolute: 0 10*3/uL (ref 0.0–0.5)
Eosinophils Relative: 0 %
HCT: 37.5 % (ref 36.0–46.0)
Hemoglobin: 12.5 g/dL (ref 12.0–15.0)
Immature Granulocytes: 1 %
Lymphocytes Relative: 26 %
Lymphs Abs: 2.2 10*3/uL (ref 0.7–4.0)
MCH: 29.4 pg (ref 26.0–34.0)
MCHC: 33.3 g/dL (ref 30.0–36.0)
MCV: 88.2 fL (ref 80.0–100.0)
Monocytes Absolute: 0.6 10*3/uL (ref 0.1–1.0)
Monocytes Relative: 8 %
Neutro Abs: 5.5 10*3/uL (ref 1.7–7.7)
Neutrophils Relative %: 65 %
Platelets: 216 10*3/uL (ref 150–400)
RBC: 4.25 MIL/uL (ref 3.87–5.11)
RDW: 13.7 % (ref 11.5–15.5)
WBC: 8.4 10*3/uL (ref 4.0–10.5)
nRBC: 0 % (ref 0.0–0.2)

## 2019-02-09 LAB — GLUCOSE, CAPILLARY
Glucose-Capillary: 69 mg/dL — ABNORMAL LOW (ref 70–99)
Glucose-Capillary: 60 mg/dL — ABNORMAL LOW (ref 70–99)
Glucose-Capillary: 71 mg/dL (ref 70–99)

## 2019-02-09 LAB — ABO/RH: ABO/RH(D): O POS

## 2019-02-09 SURGERY — Surgical Case
Anesthesia: Spinal

## 2019-02-09 MED ORDER — SODIUM CHLORIDE 0.9 % IV SOLN
INTRAVENOUS | Status: DC | PRN
  Administered 2019-02-09: 17:00:00 via INTRAVENOUS

## 2019-02-09 MED ORDER — MORPHINE SULFATE (PF) 0.5 MG/ML IJ SOLN
INTRAMUSCULAR | Status: AC
  Filled 2019-02-09: qty 10

## 2019-02-09 MED ORDER — ONDANSETRON HCL 4 MG/2ML IJ SOLN
INTRAMUSCULAR | Status: DC | PRN
  Administered 2019-02-09: 4 mg via INTRAVENOUS

## 2019-02-09 MED ORDER — FENTANYL CITRATE (PF) 100 MCG/2ML IJ SOLN: 25.0000 ug | INTRAMUSCULAR | Status: DC | PRN

## 2019-02-09 MED ORDER — FENTANYL CITRATE (PF) 100 MCG/2ML IJ SOLN
INTRAMUSCULAR | Status: AC
  Filled 2019-02-09: qty 2

## 2019-02-09 MED ORDER — NALBUPHINE HCL 10 MG/ML IJ SOLN: 5.0000 mg | Freq: Once | INTRAMUSCULAR | Status: DC | PRN

## 2019-02-09 MED ORDER — ONDANSETRON HCL 4 MG/2ML IJ SOLN
INTRAMUSCULAR | Status: AC
  Filled 2019-02-09: qty 2

## 2019-02-09 MED ORDER — SIMETHICONE 80 MG PO CHEW
80.0000 mg | CHEWABLE_TABLET | Freq: Three times a day (TID) | ORAL | Status: DC
  Administered 2019-02-10 – 2019-02-11 (×4): 80 mg via ORAL
  Filled 2019-02-09 (×4): qty 1

## 2019-02-09 MED ORDER — KETOROLAC TROMETHAMINE 30 MG/ML IJ SOLN
INTRAMUSCULAR | Status: AC
  Filled 2019-02-09: qty 1

## 2019-02-09 MED ORDER — BUPIVACAINE IN DEXTROSE 0.75-8.25 % IT SOLN
INTRATHECAL | Status: DC | PRN
  Administered 2019-02-09: 1.6 mL via INTRATHECAL

## 2019-02-09 MED ORDER — PHENYLEPHRINE HCL-NACL 20-0.9 MG/250ML-% IV SOLN
INTRAVENOUS | Status: DC | PRN
  Administered 2019-02-09: 60 ug/min via INTRAVENOUS

## 2019-02-09 MED ORDER — DIPHENHYDRAMINE HCL 50 MG/ML IJ SOLN: 12.5000 mg | INTRAMUSCULAR | Status: DC | PRN

## 2019-02-09 MED ORDER — PRENATAL MULTIVITAMIN CH
1.0000 | ORAL_TABLET | Freq: Every day | ORAL | Status: DC
  Administered 2019-02-10: 11:00:00 1 via ORAL
  Filled 2019-02-09 (×2): qty 1

## 2019-02-09 MED ORDER — MENTHOL 3 MG MT LOZG
1.0000 | LOZENGE | OROMUCOSAL | Status: DC | PRN
  Filled 2019-02-09: qty 9

## 2019-02-09 MED ORDER — ONDANSETRON HCL 4 MG/2ML IJ SOLN: 4.0000 mg | Freq: Three times a day (TID) | INTRAMUSCULAR | Status: DC | PRN

## 2019-02-09 MED ORDER — MORPHINE SULFATE (PF) 0.5 MG/ML IJ SOLN
INTRAMUSCULAR | Status: DC | PRN
  Administered 2019-02-09: .15 mg via INTRATHECAL

## 2019-02-09 MED ORDER — ONDANSETRON HCL 4 MG/2ML IJ SOLN: 4.0000 mg | Freq: Once | INTRAMUSCULAR | Status: DC | PRN

## 2019-02-09 MED ORDER — SODIUM CHLORIDE 0.9% FLUSH: 3.0000 mL | INTRAVENOUS | Status: DC | PRN

## 2019-02-09 MED ORDER — METOCLOPRAMIDE HCL 5 MG/ML IJ SOLN
INTRAMUSCULAR | Status: DC | PRN
  Administered 2019-02-09 (×2): 5 mg via INTRAVENOUS

## 2019-02-09 MED ORDER — SIMETHICONE 80 MG PO CHEW
80.0000 mg | CHEWABLE_TABLET | ORAL | Status: DC
  Administered 2019-02-10 (×2): 80 mg via ORAL
  Filled 2019-02-09 (×3): qty 1

## 2019-02-09 MED ORDER — SIMETHICONE 80 MG PO CHEW
80.0000 mg | CHEWABLE_TABLET | ORAL | Status: DC | PRN
  Filled 2019-02-09: qty 1

## 2019-02-09 MED ORDER — OXYCODONE-ACETAMINOPHEN 5-325 MG PO TABS: 1.0000 | ORAL_TABLET | ORAL | Status: DC | PRN

## 2019-02-09 MED ORDER — DIPHENHYDRAMINE HCL 25 MG PO CAPS: 25.0000 mg | ORAL_CAPSULE | ORAL | Status: DC | PRN

## 2019-02-09 MED ORDER — SENNOSIDES-DOCUSATE SODIUM 8.6-50 MG PO TABS
2.0000 | ORAL_TABLET | ORAL | Status: DC
  Administered 2019-02-10 (×2): 2 via ORAL
  Filled 2019-02-09 (×3): qty 2

## 2019-02-09 MED ORDER — TETANUS-DIPHTH-ACELL PERTUSSIS 5-2.5-18.5 LF-MCG/0.5 IM SUSP
0.5000 mL | Freq: Once | INTRAMUSCULAR | Status: DC
  Filled 2019-02-09: qty 0.5

## 2019-02-09 MED ORDER — DEXAMETHASONE SODIUM PHOSPHATE 4 MG/ML IJ SOLN
INTRAMUSCULAR | Status: AC
  Filled 2019-02-09: qty 1

## 2019-02-09 MED ORDER — DEXAMETHASONE SODIUM PHOSPHATE 4 MG/ML IJ SOLN
INTRAMUSCULAR | Status: DC | PRN
  Administered 2019-02-09: 4 mg via INTRAVENOUS

## 2019-02-09 MED ORDER — KETOROLAC TROMETHAMINE 30 MG/ML IJ SOLN: 30.0000 mg | Freq: Once | INTRAMUSCULAR | Status: DC

## 2019-02-09 MED ORDER — ACETAMINOPHEN 160 MG/5ML PO SOLN: 325.0000 mg | ORAL | Status: DC | PRN

## 2019-02-09 MED ORDER — NALOXONE HCL 0.4 MG/ML IJ SOLN: 0.4000 mg | INTRAMUSCULAR | Status: DC | PRN

## 2019-02-09 MED ORDER — LACTATED RINGERS IV SOLN
INTRAVENOUS | Status: DC
  Administered 2019-02-09: 12:00:00 via INTRAVENOUS

## 2019-02-09 MED ORDER — ACETAMINOPHEN 325 MG PO TABS
650.0000 mg | ORAL_TABLET | ORAL | Status: DC | PRN
  Administered 2019-02-09: 22:00:00 650 mg via ORAL
  Filled 2019-02-09: qty 2

## 2019-02-09 MED ORDER — COCONUT OIL OIL
1.0000 "application " | TOPICAL_OIL | Status: DC | PRN
  Administered 2019-02-10: 1 via TOPICAL
  Filled 2019-02-09: qty 120

## 2019-02-09 MED ORDER — OXYCODONE HCL 5 MG/5ML PO SOLN: 5.0000 mg | Freq: Once | ORAL | Status: DC | PRN

## 2019-02-09 MED ORDER — WITCH HAZEL-GLYCERIN EX PADS: 1.0000 "application " | MEDICATED_PAD | CUTANEOUS | Status: DC | PRN

## 2019-02-09 MED ORDER — KETOROLAC TROMETHAMINE 30 MG/ML IJ SOLN
30.0000 mg | Freq: Four times a day (QID) | INTRAMUSCULAR | Status: DC | PRN
  Administered 2019-02-09 – 2019-02-10 (×3): 30 mg via INTRAVENOUS
  Filled 2019-02-09 (×2): qty 1

## 2019-02-09 MED ORDER — NALBUPHINE HCL 10 MG/ML IJ SOLN: 5.0000 mg | INTRAMUSCULAR | Status: DC | PRN

## 2019-02-09 MED ORDER — DIBUCAINE (PERIANAL) 1 % EX OINT
1.0000 "application " | TOPICAL_OINTMENT | CUTANEOUS | Status: DC | PRN
  Filled 2019-02-09: qty 28

## 2019-02-09 MED ORDER — MEPERIDINE HCL 25 MG/ML IJ SOLN: 6.2500 mg | INTRAMUSCULAR | Status: DC | PRN

## 2019-02-09 MED ORDER — SCOPOLAMINE 1 MG/3DAYS TD PT72
1.0000 | MEDICATED_PATCH | Freq: Once | TRANSDERMAL | Status: DC
  Administered 2019-02-09: 18:00:00 1.5 mg via TRANSDERMAL

## 2019-02-09 MED ORDER — CEFAZOLIN SODIUM-DEXTROSE 2-4 GM/100ML-% IV SOLN
INTRAVENOUS | Status: AC
  Filled 2019-02-09: qty 100

## 2019-02-09 MED ORDER — ACETAMINOPHEN 325 MG PO TABS: 325.0000 mg | ORAL_TABLET | ORAL | Status: DC | PRN

## 2019-02-09 MED ORDER — FENTANYL CITRATE (PF) 100 MCG/2ML IJ SOLN
INTRAMUSCULAR | Status: DC | PRN
  Administered 2019-02-09: 15 ug via INTRATHECAL

## 2019-02-09 MED ORDER — CEFAZOLIN SODIUM-DEXTROSE 2-4 GM/100ML-% IV SOLN
2.0000 g | INTRAVENOUS | Status: AC
  Administered 2019-02-09: 16:00:00 2 g via INTRAVENOUS

## 2019-02-09 MED ORDER — LACTATED RINGERS IV SOLN
INTRAVENOUS | Status: DC
  Administered 2019-02-10: 01:00:00 via INTRAVENOUS

## 2019-02-09 MED ORDER — METOCLOPRAMIDE HCL 5 MG/ML IJ SOLN
INTRAMUSCULAR | Status: AC
  Filled 2019-02-09: qty 2

## 2019-02-09 MED ORDER — OXYTOCIN 40 UNITS IN NORMAL SALINE INFUSION - SIMPLE MED
INTRAVENOUS | Status: AC
  Filled 2019-02-09: qty 1000

## 2019-02-09 MED ORDER — NALOXONE HCL 4 MG/10ML IJ SOLN
1.0000 ug/kg/h | INTRAVENOUS | Status: DC | PRN
  Filled 2019-02-09: qty 5

## 2019-02-09 MED ORDER — SODIUM CHLORIDE 0.9 % IV SOLN
INTRAVENOUS | Status: DC | PRN
  Administered 2019-02-09: 17:00:00 40 [IU] via INTRAVENOUS

## 2019-02-09 MED ORDER — DIPHENHYDRAMINE HCL 25 MG PO CAPS: 25.0000 mg | ORAL_CAPSULE | Freq: Four times a day (QID) | ORAL | Status: DC | PRN

## 2019-02-09 MED ORDER — OXYTOCIN 40 UNITS IN NORMAL SALINE INFUSION - SIMPLE MED: 2.5000 [IU]/h | INTRAVENOUS | Status: DC

## 2019-02-09 MED ORDER — OXYCODONE HCL 5 MG PO TABS: 5.0000 mg | ORAL_TABLET | Freq: Once | ORAL | Status: DC | PRN

## 2019-02-09 MED ORDER — KETOROLAC TROMETHAMINE 30 MG/ML IJ SOLN: 30.0000 mg | Freq: Four times a day (QID) | INTRAMUSCULAR | Status: DC | PRN

## 2019-02-09 MED ORDER — SCOPOLAMINE 1 MG/3DAYS TD PT72
MEDICATED_PATCH | TRANSDERMAL | Status: AC
  Filled 2019-02-09: qty 1

## 2019-02-09 SURGICAL SUPPLY — 38 items
ADH SKN CLS APL DERMABOND .7 (GAUZE/BANDAGES/DRESSINGS)
APL SKNCLS STERI-STRIP NONHPOA (GAUZE/BANDAGES/DRESSINGS) ×1
BENZOIN TINCTURE PRP APPL 2/3 (GAUZE/BANDAGES/DRESSINGS) ×2 IMPLANT
CHLORAPREP W/TINT 26ML (MISCELLANEOUS) ×3 IMPLANT
CLAMP CORD UMBIL (MISCELLANEOUS) IMPLANT
CLOSURE WOUND 1/2 X4 (GAUZE/BANDAGES/DRESSINGS) ×1
CLOTH BEACON ORANGE TIMEOUT ST (SAFETY) ×3 IMPLANT
DERMABOND ADVANCED (GAUZE/BANDAGES/DRESSINGS)
DERMABOND ADVANCED .7 DNX12 (GAUZE/BANDAGES/DRESSINGS) IMPLANT
DRSG OPSITE POSTOP 4X10 (GAUZE/BANDAGES/DRESSINGS) ×3 IMPLANT
ELECT REM PT RETURN 9FT ADLT (ELECTROSURGICAL) ×3
ELECTRODE REM PT RTRN 9FT ADLT (ELECTROSURGICAL) ×1 IMPLANT
EXTRACTOR VACUUM M CUP 4 TUBE (SUCTIONS) IMPLANT
EXTRACTOR VACUUM M CUP 4' TUBE (SUCTIONS)
GLOVE BIO SURGEON STRL SZ7 (GLOVE) ×3 IMPLANT
GLOVE BIOGEL PI IND STRL 7.0 (GLOVE) ×1 IMPLANT
GLOVE BIOGEL PI INDICATOR 7.0 (GLOVE) ×2
GOWN STRL REUS W/TWL LRG LVL3 (GOWN DISPOSABLE) ×6 IMPLANT
KIT ABG SYR 3ML LUER SLIP (SYRINGE) IMPLANT
NDL HYPO 25X5/8 SAFETYGLIDE (NEEDLE) IMPLANT
NEEDLE HYPO 22GX1.5 SAFETY (NEEDLE) IMPLANT
NEEDLE HYPO 25X5/8 SAFETYGLIDE (NEEDLE) IMPLANT
NS IRRIG 1000ML POUR BTL (IV SOLUTION) ×3 IMPLANT
PACK C SECTION WH (CUSTOM PROCEDURE TRAY) ×3 IMPLANT
PAD OB MATERNITY 4.3X12.25 (PERSONAL CARE ITEMS) ×3 IMPLANT
PENCIL SMOKE EVAC W/HOLSTER (ELECTROSURGICAL) ×3 IMPLANT
RTRCTR C-SECT PINK 25CM LRG (MISCELLANEOUS) ×3 IMPLANT
STRIP CLOSURE SKIN 1/2X4 (GAUZE/BANDAGES/DRESSINGS) ×2 IMPLANT
SUT CHROMIC 1 CTX 36 (SUTURE) ×6 IMPLANT
SUT CHROMIC 2 0 CT 1 (SUTURE) ×3 IMPLANT
SUT PDS AB 0 CTX 60 (SUTURE) ×3 IMPLANT
SUT VIC AB 2-0 CT1 27 (SUTURE) ×3
SUT VIC AB 2-0 CT1 TAPERPNT 27 (SUTURE) ×1 IMPLANT
SUT VIC AB 4-0 KS 27 (SUTURE) IMPLANT
SYR 30ML LL (SYRINGE) IMPLANT
TOWEL OR 17X24 6PK STRL BLUE (TOWEL DISPOSABLE) ×3 IMPLANT
TRAY FOLEY W/BAG SLVR 14FR LF (SET/KITS/TRAYS/PACK) ×3 IMPLANT
WATER STERILE IRR 1000ML POUR (IV SOLUTION) ×3 IMPLANT

## 2019-02-09 NOTE — Progress Notes (Addendum)
1500 CBG 69.  Patient assymptomatic, MDA notified of results, no orders received for hypoglycemia at this time. CWhitlock, RNC

## 2019-02-09 NOTE — Anesthesia Procedure Notes (Signed)
Spinal  Patient location during procedure: OR Start time: 02/09/2019 4:25 PM End time: 02/09/2019 4:22 PM Staffing Anesthesiologist: Bethena Midget, MD Preanesthetic Checklist Completed: patient identified, site marked, surgical consent, pre-op evaluation, timeout performed, IV checked, risks and benefits discussed and monitors and equipment checked Spinal Block Patient position: sitting Prep: DuraPrep Patient monitoring: heart rate, cardiac monitor, continuous pulse ox and blood pressure Approach: midline Location: L3-4 Injection technique: single-shot Needle Needle type: Sprotte  Needle gauge: 24 G Needle length: 9 cm Assessment Sensory level: T4

## 2019-02-09 NOTE — Transfer of Care (Signed)
Immediate Anesthesia Transfer of Care Note  Patient: Traci Sweeney  Procedure(s) Performed: CESAREAN SECTION (N/A )  Patient Location: PACU  Anesthesia Type:Spinal  Level of Consciousness: awake and patient cooperative  Airway & Oxygen Therapy: Patient Spontanous Breathing  Post-op Assessment: Report given to RN and Post -op Vital signs reviewed and stable  Post vital signs: Reviewed and stable  Last Vitals:  Vitals Value Taken Time  BP 115/59 02/09/2019  5:48 PM  Temp    Pulse 62 02/09/2019  5:51 PM  Resp 18 02/09/2019  5:51 PM  SpO2 100 % 02/09/2019  5:51 PM  Vitals shown include unvalidated device data.  Last Pain:  Vitals:   02/09/19 1205  TempSrc: Oral         Complications: No apparent anesthesia complications

## 2019-02-09 NOTE — Anesthesia Procedure Notes (Signed)
Spinal

## 2019-02-09 NOTE — Op Note (Signed)
Pre-Operative Diagnosis: 1) 37+2 week intrauterine pregnancy 2) gestational hypertension 3) desired permanent sterilization Postoperative Diagnosis: 1) 37+2 week intrauterine pregnancy 2) gestational hypertension 3) desired permanent sterilization  Procedure: Repeat low transverse cesarean section with [place] partial salpingectomy Surgeon: Dr. Waynard Reeds Assistant: Genice Rouge RNFA Operative Findings: Vigorous female infant in the vertex presentation with apgars of 9 at 1 minute and 9 at 5 minutes. Normal ovaries and tubes. A bilateral total salpingectomy was initially planned pre-operatively, however the vascularity of the mesosalpinx precluded this. Therefore, a bilateral partial salpingectomy was performed. Specimen: placenta and bilateral tubal segments EBL: 432 cc   Procedure:Traci Sweeney is an 33 year old gravida 3 para 2002 at 41 weeks and 2 days estimated gestational age who presents for cesarean section. Following the appropriate informed consent the patient was brought to the operating room where spinal anesthesia was administered and found to be adequate. She was placed in the dorsal supine position with a leftward tilt. She was prepped and draped in the normal sterile fashion. The patient was appropriately identified during a pre-operative time out procedure. Scalpel was then used to make a Pfannenstiel skin incision which was carried down to the underlying layers of soft tissue to the fascia. The fascia was incised in the midline and the fascial incision was extended laterally with Mayo scissors. The superior aspect of the fascial incision was grasped with Coker clamps x2, tented up and the rectus muscles dissected off sharply with the electrocautery unit area and the same procedure was repeated on the inferior aspect of the fascial incision. The rectus muscles were separated in the midline. The abdominal peritoneum was identified, tented up, entered sharply, and the incision was extended  superiorly and inferiorly with good visualization of the bladder. The Alexis retractor was then deployed. The vesicouterine peritoneum was identified, tented up, entered sharply, and the bladder flap was created digitally. Scalpel was then used to make a low transverse incision on the uterus which was extended laterally with blunt dissection. The fetal vertex was identified, delivered easily through the uterine incision followed by the body. The infant was bulb suctioned on the operative field and cried vigorously. After a 1 minute delay, the cord was clamped and cut and the infant was passed to the waiting neonatology team. Placenta was then delivered spontaneously, the uterus was cleared of all clot and debris. The uterine incision was repaired with #1 chromic in running locked fashion. Ovaries and tubes were inspected and normal appearing. The Alexis retractor was removed. The uterus was returned to the abdominal cavity the abdominal cavity was cleared of all clot and debris. The abdominal peritoneum was reapproximated with 2-0 Vicryl in a running fashion, the rectus muscles was reapproximated with 2-0 chromic in a running fashion. The fascia was closed with a looped PDS in a running fashion. The skin was closed with 4-0 vicryl in a subcuticular fashion and Dermabond. All sponge lap and needle counts were correct. Patient tolerated the procedure well and recovered in stable condition following the procedure.

## 2019-02-09 NOTE — Anesthesia Preprocedure Evaluation (Signed)
Anesthesia Evaluation  Patient identified by MRN, date of birth, ID band Patient awake    Reviewed: Allergy & Precautions, NPO status , Patient's Chart, lab work & pertinent test results  Airway Mallampati: III  TM Distance: >3 FB Neck ROM: Full    Dental no notable dental hx. (+) Teeth Intact   Pulmonary former smoker,    Pulmonary exam normal breath sounds clear to auscultation       Cardiovascular hypertension, Pt. on medications Normal cardiovascular exam Rhythm:Regular Rate:Normal     Neuro/Psych negative neurological ROS  negative psych ROS   GI/Hepatic Neg liver ROS, GERD  Medicated and Controlled,  Endo/Other  diabetesObesity  Renal/GU negative Renal ROS  negative genitourinary   Musculoskeletal negative musculoskeletal ROS (+)   Abdominal (+) + obese,   Peds  Hematology  (+) Blood dyscrasia, anemia ,   Anesthesia Other Findings   Reproductive/Obstetrics (+) Pregnancy HSV                             Anesthesia Physical  Anesthesia Plan  ASA: III  Anesthesia Plan: Spinal   Post-op Pain Management:    Induction:   PONV Risk Score and Plan:   Airway Management Planned: Natural Airway and Nasal Cannula  Additional Equipment:   Intra-op Plan:   Post-operative Plan:   Informed Consent: I have reviewed the patients History and Physical, chart, labs and discussed the procedure including the risks, benefits and alternatives for the proposed anesthesia with the patient or authorized representative who has indicated his/her understanding and acceptance.       Plan Discussed with: Anesthesiologist, CRNA and Surgeon  Anesthesia Plan Comments: (  )        Anesthesia Quick Evaluation

## 2019-02-09 NOTE — Anesthesia Postprocedure Evaluation (Signed)
Anesthesia Post Note  Patient: Traci Sweeney  Procedure(s) Performed: CESAREAN SECTION (N/A )     Patient location during evaluation: PACU Anesthesia Type: Spinal Level of consciousness: awake and alert Pain management: pain level controlled Vital Signs Assessment: post-procedure vital signs reviewed and stable Respiratory status: spontaneous breathing and respiratory function stable Cardiovascular status: blood pressure returned to baseline and stable Postop Assessment: spinal receding Anesthetic complications: no    Last Vitals:  Vitals:   02/09/19 1815 02/09/19 1830  BP: 121/70 130/79  Pulse: 71 74  Resp: 14 13  Temp:    SpO2: 100% 98%    Last Pain:  Vitals:   02/09/19 1750  TempSrc: Oral   Pain Goal:    LLE Motor Response: Purposeful movement (02/09/19 1830) LLE Sensation: Tingling (02/09/19 1830) RLE Motor Response: Purposeful movement (02/09/19 1830) RLE Sensation: Tingling (02/09/19 1830)     Epidural/Spinal Function Cutaneous sensation: Tingles (02/09/19 1830), Patient able to flex knees: Yes (02/09/19 1830), Patient able to lift hips off bed: No (02/09/19 1830), Back pain beyond tenderness at insertion site: No (02/09/19 1830), Progressively worsening motor and/or sensory loss: No (02/09/19 1830), Bowel and/or bladder incontinence post epidural: No (02/09/19 1830)  [name] DANIEL

## 2019-02-09 NOTE — H&P (Signed)
Traci Sweeney is a 33 y.o. female presenting for elevated blood pressures  33yo G3P2002 @ 37+2 presents from the office today for evaluation of elevated blood pressure. She was seen yesterday for a routine OB visit and had BPs 142/92 and 138/90. She was advised to return today for a BP check and again her blood pressures were elevated at 134/98 and 138/90. She meets criteria for gestational hypertension. Given gestational age is greater than 37 weeks will proceed with delivery.  Her pregnancy has been complicated by A1 diabetes   She desires repeat C/S and bilateral salpingectomy for desired permanent sterilization OB History    Gravida  3   Para  2   Term  2   Preterm  0   AB  0   Living  2     SAB  0   TAB  0   Ectopic  0   Multiple  0   Live Births  2          Past Medical History:  Diagnosis Date  . Gestational diabetes   . HSV-1 infection   . Inguinal hernia infant   bilateral  . UTI (lower urinary tract infection) 2006 - 2007    Dr. Sherron Monday uro eval seconday to frequent infection with negative cysto   Past Surgical History:  Procedure Laterality Date  . CESAREAN SECTION N/A 07/17/2015   Procedure: CESAREAN SECTION;  Surgeon: Essie Hart, MD;  Location: [facility]S;  Service: Obstetrics;  Laterality: N/A;  . CESAREAN SECTION N/A 01/05/2018   Procedure: REPEAT CESAREAN SECTION;  Surgeon: Carrington Clamp, MD;  Location: [facility] BIRTHING SU[facility];  Service: Obstetrics;  Laterality: N/A;  . CYSTOSCOPY  10/2005   negative  . INGUINAL HERNIA REPAIR Bilateral infant  . MYRINGOTOMY  2005  . TONSILLECTOMY  age 64  . TYMPANOPLASTY  2006   Family History: family history includes Cancer in her father; Diabetes in her father; Heart attack (age of onset: 29) in her paternal grandfather; Heart disease in her mother; Hypertension in her mother; Lung cancer in her maternal grandfather and paternal grandfather; Lymphoma in her paternal uncle. Social History:  reports that she quit  smoking about 7 years ago. Her smoking use included cigarettes. She has a 4.00 pack-year smoking history. She has never used smokeless tobacco. She reports that she does not drink alcohol or use drugs.     Maternal Diabetes: Yes:  Diabetes Type:  Diet controlled Genetic Screening: Normal Maternal Ultrasounds/Referrals: Normal Fetal Ultrasounds or other Referrals:  None Maternal Substance Abuse:  No Significant Maternal Medications:  None Significant Maternal Lab Results:  None Other Comments:  None  ROS History   currently breastfeeding. Exam Physical Exam  Prenatal labs: ABO, Rh:  O positive Antibody:  Negative Rubella:  immune RPR:   NR HBsAg:   Neg HIV:   NR GBS:   Neg  Assessment/Plan: 1) Admit 2) consent for repeat c/s and bilateral salpingectomy 3) Anceg 2 gm OCTOR 4) CMP, CBC 5) NST 6) Pt last ate at 8am 7) SCDs    Waynard Reeds 02/09/2019, 12:04 PM

## 2019-02-10 ENCOUNTER — Other Ambulatory Visit: Payer: Self-pay

## 2019-02-10 ENCOUNTER — Encounter (HOSPITAL_COMMUNITY): Payer: Self-pay | Admitting: Obstetrics and Gynecology

## 2019-02-10 LAB — CBC
HCT: 31.7 % — ABNORMAL LOW (ref 36.0–46.0)
Hemoglobin: 10.4 g/dL — ABNORMAL LOW (ref 12.0–15.0)
MCH: 28.7 pg (ref 26.0–34.0)
MCHC: 32.8 g/dL (ref 30.0–36.0)
MCV: 87.6 fL (ref 80.0–100.0)
Platelets: 188 10*3/uL (ref 150–400)
RBC: 3.62 MIL/uL — ABNORMAL LOW (ref 3.87–5.11)
RDW: 13.4 % (ref 11.5–15.5)
WBC: 9.9 10*3/uL (ref 4.0–10.5)
nRBC: 0 % (ref 0.0–0.2)

## 2019-02-10 LAB — RPR: RPR Ser Ql: NONREACTIVE

## 2019-02-10 LAB — NOVEL CORONAVIRUS, NAA (HOSP ORDER, SEND-OUT TO REF LAB; TAT 18-24 HRS): SARS-CoV-2, NAA: NOT DETECTED

## 2019-02-10 MED ORDER — IBUPROFEN 600 MG PO TABS
600.0000 mg | ORAL_TABLET | Freq: Four times a day (QID) | ORAL | Status: DC
  Administered 2019-02-10 – 2019-02-11 (×4): 600 mg via ORAL
  Filled 2019-02-10 (×4): qty 1

## 2019-02-10 NOTE — Lactation Note (Signed)
This note was copied from a baby's chart. Lactation Consultation Note  Patient Name: Traci Sweeney [id number] Date: 02/10/2019 Reason for consult: Initial assessment;Early term 37-38.6wks P3  Mom has a history of a breast abcess in left breast 6 months ago.  She lost her milk supply in breast.  Mom states she usually has to supplement formula after 3-4 months.  Mom has been able to hand express colostrum from both breasts.  She is wondering if her supply will be low on the left side. She also states breastfeeding was never comfortable with first two babies.  She had chronic sore nipples and was seen outpatient for support. Mom feels newborn is feeding well.  She cluster fed last night and currently sleepy.  Instructed to feed with cues and call for assist prn.  Breastfeeding consultation services and support information given and reviewed.  Maternal Data Has patient been taught Hand Expression?: Yes Does the patient have breastfeeding experience prior to this delivery?: Yes  Feeding Feeding Type: Breast Fed  LATCH Score                   Interventions    Lactation Tools Discussed/Used     Consult Status Consult Status: Follow-up Date: 02/11/19 Follow-up type: In-patient    Huston Foley 02/10/2019, 11:08 AM

## 2019-02-10 NOTE — Progress Notes (Signed)
Patient is eating, ambulating.  Pain control is good.  No complaints, void pending.  Appropriate lochia.  Vitals:   02/09/19 2207 02/09/19 2301 02/10/19 0303 02/10/19 0620  BP: 130/74 126/74 110/65 109/71  Pulse: (!) 59 (!) 57  (!) 58  Resp: 17 18 18 16   Temp: 97.6 F (36.4 C) 97.8 F (36.6 C) 98.4 F (36.9 C) 98.4 F (36.9 C)  TempSrc: Oral Oral Oral Oral  SpO2: 98% 99% 98% 98%  Weight:      Height:        Fundus firm Abd: soft nontender Inc: c/d/i Ext: no calf tenderness  Lab Results  Component Value Date   WBC 9.9 02/10/2019   HGB 10.4 (L) 02/10/2019   HCT 31.7 (L) 02/10/2019   MCV 87.6 02/10/2019   PLT 188 02/10/2019    --/--/O POS, O POS Performed at [facility], [address] [place], [place] [address]  (05/07 1211)  A/P Post op day #1 s/p R c/s and BTL, GHTN BPs normal range, will continue to monitor  Routine care.  Expect d/c 5/9.    Philip Aspen

## 2019-02-11 MED ORDER — OXYCODONE-ACETAMINOPHEN 5-325 MG PO TABS: 1.0000 | ORAL_TABLET | ORAL | 0 refills | Status: DC | PRN

## 2019-02-11 NOTE — Discharge Summary (Signed)
Obstetric Discharge Summary Reason for Admission: repeat cesarean section and GHTN Prenatal Procedures: ultrasound Intrapartum Procedures: cesarean: low cervical, transverse Postpartum Procedures: none Complications-Operative and Postpartum: none Hemoglobin  Date Value Ref Range Status  02/10/2019 10.4 (L) 12.0 - 15.0 g/dL Final   Hemoglobin, fingerstick  Date Value Ref Range Status  08/02/2014 13.8 12.0 - 16.0 g/dL Final   HCT  Date Value Ref Range Status  02/10/2019 31.7 (L) 36.0 - 46.0 % Final    Physical Exam:  General: alert and cooperative Lochia: appropriate Uterine Fundus: firm Incision: healing well, no significant drainage DVT Evaluation: No evidence of DVT seen on physical exam.  Discharge Diagnoses: Term Pregnancy-delivered  Discharge Information: Date: 02/11/2019 Activity: pelvic rest Diet: routine Medications: PNV, Ibuprofen and Percocet Condition: stable Instructions: refer to practice specific booklet Discharge to: home Follow-up Information    Waynard Reeds, MD Follow up in 4 week(s).   Specialty:  Obstetrics and Gynecology Contact information: [address] [place] [address] [phone]           Newborn Data: Live born female  Birth Weight: 6 lb 12.3 oz (3070 g) APGAR: 9, 9  Newborn Delivery   Birth date/time:  02/09/2019 16:52:00 Delivery type:  C-Section, Low Transverse Trial of labor:  No C-section categorization:  Repeat     Home with mother.  Philip Aspen 02/11/2019, 7:55 AM

## 2019-02-11 NOTE — Lactation Note (Signed)
This note was copied from a baby's chart. Lactation Consultation Note  Patient Name: Traci Sweeney [id number] Date: 02/11/2019 Reason for consult: Follow-up assessment;Early term 4-38.6wks  Spoke with P3 Mom of ET infant on day of discharge.  Baby is 39 hrs old and at 7.8% weight loss with good output.  Mom states stools are changing to green.   Mom feels baby is latching well, some soreness on initial latch, but doesn't continue throughout the feeding.  Using coconut oil and colostrum on nipples.  Reviewed basics of a good positioning and latching, encouraging Mom to change up her positions throughout the day. Mom encouraged to keep baby STS and feed baby >8 times per 24 hrs.  Mom is waking baby at 3 hrs, to avoid baby sleeping too long.   Encouraged breast massage and hand expression prior to latching. Engorgement prevention and treatment reviewed. Mom to call prn for concerns.  History of mastitis leading to breast abscess, Mom knows to call for concerns.   Consult Status Consult Status: Complete Date: 02/11/19 Follow-up type: Call as needed    Traci Sweeney 02/11/2019, 8:18 AM

## 2019-02-21 ENCOUNTER — Other Ambulatory Visit (HOSPITAL_COMMUNITY): Payer: 59

## 2019-02-23 ENCOUNTER — Inpatient Hospital Stay (HOSPITAL_COMMUNITY): Admit: 2019-02-23 | Payer: 59 | Admitting: Obstetrics and Gynecology

## 2019-02-23 ENCOUNTER — Encounter (HOSPITAL_COMMUNITY): Payer: Self-pay

## 2019-02-23 SURGERY — Surgical Case
Anesthesia: Regional | Laterality: Bilateral

## 2019-03-17 DIAGNOSIS — Z124 Encounter for screening for malignant neoplasm of cervix: Secondary | ICD-10-CM | POA: Diagnosis not present

## 2019-03-29 DIAGNOSIS — Z760 Encounter for issue of repeat prescription: Secondary | ICD-10-CM | POA: Diagnosis not present

## 2019-05-05 DIAGNOSIS — F329 Major depressive disorder, single episode, unspecified: Secondary | ICD-10-CM | POA: Diagnosis not present

## 2019-05-05 DIAGNOSIS — R102 Pelvic and perineal pain: Secondary | ICD-10-CM | POA: Diagnosis not present

## 2019-08-18 DIAGNOSIS — N920 Excessive and frequent menstruation with regular cycle: Secondary | ICD-10-CM | POA: Diagnosis not present

## 2019-08-18 DIAGNOSIS — R102 Pelvic and perineal pain: Secondary | ICD-10-CM | POA: Diagnosis not present

## 2019-08-21 ENCOUNTER — Encounter (INDEPENDENT_AMBULATORY_CARE_PROVIDER_SITE_OTHER): Payer: 59 | Admitting: Family Medicine

## 2019-08-21 ENCOUNTER — Other Ambulatory Visit: Payer: Self-pay

## 2019-08-21 ENCOUNTER — Encounter: Payer: Self-pay | Admitting: Family Medicine

## 2019-08-21 NOTE — Progress Notes (Signed)
Patient initially scheduled for new patient face to face visit today. She completed initial triage and routine COVID screening questions. She was brought back into room however, there was a reported sore throat today that was of concern and concerns of possible viral exposure. No known COVID19 exposure at this time.  It was recommended that she leave the office building to avoid any potential viral exposure to staff and other patients, and we offered to keep her scheduled apt today and transition it to a Virtual [employer] video visit instead for remote -establish care visit. However she requested to re-schedule until this current symptom has resolved for repeat attempt at initial establish care visit.  No medical care or treatment advice given today.  Nobie Putnam, [facility] 08/21/2019, 3:56 PM

## 2020-01-03 DIAGNOSIS — F329 Major depressive disorder, single episode, unspecified: Secondary | ICD-10-CM | POA: Diagnosis not present

## 2020-01-03 DIAGNOSIS — N803 Endometriosis of pelvic peritoneum: Secondary | ICD-10-CM | POA: Diagnosis not present

## 2020-03-13 DIAGNOSIS — H5213 Myopia, bilateral: Secondary | ICD-10-CM | POA: Diagnosis not present

## 2020-03-15 ENCOUNTER — Other Ambulatory Visit: Payer: Self-pay | Admitting: Obstetrics and Gynecology

## 2020-04-18 NOTE — Patient Instructions (Addendum)
DUE TO COVID-19 ONLY ONE VISITOR IS ALLOWED IN WAITING ROOM (VISITOR WILL HAVE A TEMPERATURE CHECK ON ARRIVAL AND MUST WEAR A FACE MASK THE ENTIRE TIME.)  ONCE YOU ARE ADMITTED TO YOUR PRIVATE ROOM, THE SAME ONE VISITOR IS ALLOWED TO VISIT DURING VISITING HOURS ONLY.  Your COVID swab testing is scheduled for Saturday, April 27, 2020  at 9:20 AM , You must self quarantine after your testing per handout given to you at the testing site.  ([address], [place] [facility] [address] up testing enter pre-surgical testing line)    Your procedure is scheduled on:  Wednesday, May 01, 2020  Report to [facility] AT  7:45 A. M.   Call this number if you have problems the morning of surgery:  [phone].   OUR ADDRESS IS [address].  WE ARE LOCATED IN [place]                                   [place].                                     REMEMBER:  DO NOT EAT FOOD AFTER MIDNIGHT .  Marland Kitchen  MAY HAVE LIQUIDS UNTIL 6:45 AM DAY OF SURGERY  CLEAR LIQUID DIET  Foods Allowed                                                                     Foods Excluded  Water, Black Coffee and tea, regular and decaf                             liquids that you cannot  Plain Jell-O in any flavor  (No red)                                           see through such as: Fruit ices (not with fruit pulp)                                     milk, soups, orange juice  Iced Popsicles (No red)                                    All solid food                                   Apple juices Sports drinks like Gatorade (No red) Lightly seasoned clear broth or consume(fat free) Sugar, honey syrup  Sample Menu Breakfast                                Lunch  Supper Cranberry juice                    Beef broth                            Chicken broth Jell-O                                     Grape juice                           Apple  juice Coffee or tea                        Jell-O                                      Popsicle                                                Coffee or tea                        Coffee or tea  BRUSH YOUR TEETH THE MORNING OF SURGERY.  TAKE THESE MEDICATIONS MORNING OF SURGERY WITH A SIP OF WATER:  NONE  DO NOT WEAR JEWERLY, MAKE UP, OR NAIL POLISH.  DO NOT WEAR LOTIONS, POWDERS, PERFUMES/COLOGNE OR DEODORANT.  DO NOT SHAVE FOR 24 HOURS PRIOR TO DAY OF SURGERY.  CONTACTS, GLASSES, OR DENTURES MAY NOT BE WORN TO SURGERY.                                    [facility] IS NOT RESPONSIBLE  FOR ANY BELONGINGS.          BRING ALL PRESCRIPTION MEDICATIONS WITH YOU THE DAY OF SURGERY IN ORIGINAL CONTAINERS                                                              [place] - Preparing for Surgery Before surgery, you can play an important role.  Because skin is not sterile, your skin needs to be as free of germs as possible.  You can reduce the number of germs on your skin by washing with CHG (chlorahexidine gluconate) soap before surgery.  CHG is an antiseptic cleaner which kills germs and bonds with the skin to continue killing germs even after washing. Please DO NOT use if you have an allergy to CHG or antibacterial soaps.  If your skin becomes reddened/irritated stop using the CHG and inform your nurse when you arrive at Short Stay. Do not shave (including legs and underarms) for at least 48 hours prior to the first CHG shower.  You may shave your face/neck.  Please follow these instructions carefully:  1.  Shower with CHG Soap the night before surgery  and the  morning of surgery.  2.  If you choose to wash your hair, wash your hair first as usual with your normal  shampoo.  3.  After you shampoo, rinse your hair and body thoroughly to remove the shampoo.                             4.  Use CHG as you would any other liquid soap.  You can apply chg directly to the skin and wash.  Gently  with a scrungie or clean washcloth.  5.  Apply the CHG Soap to your body ONLY FROM THE NECK DOWN.   Do   not use on face/ open                           Wound or open sores. Avoid contact with eyes, ears mouth and   genitals (private parts).                       Wash face,  Genitals (private parts) with your normal soap.             6.  Wash thoroughly, paying special attention to the area where your    surgery  will be performed.  7.  Thoroughly rinse your body with warm water from the neck down.  8.  DO NOT shower/wash with your normal soap after using and rinsing off the CHG Soap.                9.  Pat yourself dry with a clean towel.            10.  Wear clean pajamas.            11.  Place clean sheets on your bed the night of your first shower and do not  sleep with pets. Day of Surgery : Do not apply any lotions/deodorants the morning of surgery.  Please wear clean clothes to the hospital/surgery center.  FAILURE TO FOLLOW THESE INSTRUCTIONS MAY RESULT IN THE CANCELLATION OF YOUR SURGERY  PATIENT SIGNATURE_________________________________  NURSE SIGNATURE__________________________________  ________________________________________________________________________  WHAT IS A BLOOD TRANSFUSION? Blood Transfusion Information  A transfusion is the replacement of blood or some of its parts. Blood is made up of multiple cells which provide different functions.  Red blood cells carry oxygen and are used for blood loss replacement.  White blood cells fight against infection.  Platelets control bleeding.  Plasma helps clot blood.  Other blood products are available for specialized needs, such as hemophilia or other clotting disorders. BEFORE THE TRANSFUSION  Who gives blood for transfusions?   Healthy volunteers who are fully evaluated to make sure their blood is safe. This is blood bank blood. Transfusion therapy is the safest it has ever been in the practice of medicine. Before  blood is taken from a donor, a complete history is taken to make sure that person has no history of diseases nor engages in risky social behavior (examples are intravenous drug use or sexual activity with multiple partners). The donor's travel history is screened to minimize risk of transmitting infections, such as malaria. The donated blood is tested for signs of infectious diseases, such as HIV and hepatitis. The blood is then tested to be sure it is compatible with you in order to minimize the chance of a transfusion reaction. If you or a relative  donates blood, this is often done in anticipation of surgery and is not appropriate for emergency situations. It takes many days to process the donated blood. RISKS AND COMPLICATIONS Although transfusion therapy is very safe and saves many lives, the main dangers of transfusion include:   Getting an infectious disease.  Developing a transfusion reaction. This is an allergic reaction to something in the blood you were given. Every precaution is taken to prevent this. The decision to have a blood transfusion has been considered carefully by your caregiver before blood is given. Blood is not given unless the benefits outweigh the risks. AFTER THE TRANSFUSION  Right after receiving a blood transfusion, you will usually feel much better and more energetic. This is especially true if your red blood cells have gotten low (anemic). The transfusion raises the level of the red blood cells which carry oxygen, and this usually causes an energy increase.  The nurse administering the transfusion will monitor you carefully for complications. HOME CARE INSTRUCTIONS  No special instructions are needed after a transfusion. You may find your energy is better. Speak with your caregiver about any limitations on activity for underlying diseases you may have. SEEK MEDICAL CARE IF:   Your condition is not improving after your transfusion.  You develop redness or irritation  at the intravenous (IV) site. SEEK IMMEDIATE MEDICAL CARE IF:  Any of the following symptoms occur over the next 12 hours:  Shaking chills.  You have a temperature by mouth above 102 F (38.9 C), not controlled by medicine.  Chest, back, or muscle pain.  People around you feel you are not acting correctly or are confused.  Shortness of breath or difficulty breathing.  Dizziness and fainting.  You get a rash or develop hives.  You have a decrease in urine output.  Your urine turns a dark color or changes to pink, red, or brown. Any of the following symptoms occur over the next 10 days:  You have a temperature by mouth above 102 F (38.9 C), not controlled by medicine.  Shortness of breath.  Weakness after normal activity.  The white part of the eye turns yellow (jaundice).  You have a decrease in the amount of urine or are urinating less often.  Your urine turns a dark color or changes to pink, red, or brown. Document Released: 09/18/2000 Document Revised: 12/14/2011 Document Reviewed: 05/07/2008 [facility] Patient Information 2014 [place], [place].  _______________________________________________________________________

## 2020-04-18 NOTE — Progress Notes (Addendum)
COVID Vaccine Completed: Yes Date COVID Vaccine completed: 01/12/2020 COVID vaccine manufacturer: Pfizer      PCP - N/A Cardiologist - N/A  Chest x-ray - N/A EKG - N/A Stress Test - N/A ECHO - N/A Cardiac Cath - N/A  Sleep Study - N/A CPAP - N/A  Fasting Blood Sugar - N/A Checks Blood Sugar __N/A___ times a day  Blood Thinner Instructions: N/A Aspirin Instructions: N/A Last Dose: N/A  Anesthesia review:  N/A  Patient denies shortness of breath, fever, cough and chest pain at PAT appointment   Patient verbalized understanding of instructions that were given to them at the PAT appointment. Patient was also instructed that they will need to review over the PAT instructions again at home before surgery.

## 2020-04-23 ENCOUNTER — Encounter (HOSPITAL_COMMUNITY): Payer: Self-pay

## 2020-04-23 ENCOUNTER — Encounter (HOSPITAL_COMMUNITY)
Admission: RE | Admit: 2020-04-23 | Discharge: 2020-04-23 | Disposition: A | Discharge status: Home / Self Care | Payer: 59 | Source: Ambulatory Visit | Attending: Obstetrics and Gynecology | Admitting: Obstetrics and Gynecology

## 2020-04-23 ENCOUNTER — Other Ambulatory Visit: Payer: Self-pay

## 2020-04-23 DIAGNOSIS — Z01812 Encounter for preprocedural laboratory examination: Secondary | ICD-10-CM | POA: Insufficient documentation

## 2020-04-23 HISTORY — DX: Unspecified pre-eclampsia, unspecified trimester: O14.90

## 2020-04-23 HISTORY — DX: Nausea with vomiting, unspecified: R11.2

## 2020-04-23 HISTORY — DX: Nausea with vomiting, unspecified: Z98.890

## 2020-04-23 HISTORY — DX: Headache, unspecified: R51.9

## 2020-04-23 HISTORY — DX: Gastro-esophageal reflux disease without esophagitis: K21.9

## 2020-04-23 LAB — CBC
HCT: 40.8 % (ref 36.0–46.0)
Hemoglobin: 13.1 g/dL (ref 12.0–15.0)
MCH: 28.5 pg (ref 26.0–34.0)
MCHC: 32.1 g/dL (ref 30.0–36.0)
MCV: 88.9 fL (ref 80.0–100.0)
Platelets: 297 10*3/uL (ref 150–400)
RBC: 4.59 MIL/uL (ref 3.87–5.11)
RDW: 13.2 % (ref 11.5–15.5)
WBC: 5.7 10*3/uL (ref 4.0–10.5)
nRBC: 0 % (ref 0.0–0.2)

## 2020-04-27 ENCOUNTER — Other Ambulatory Visit (HOSPITAL_COMMUNITY)
Admission: RE | Admit: 2020-04-27 | Discharge: 2020-04-27 | Disposition: A | Discharge status: Home / Self Care | Payer: 59 | Source: Ambulatory Visit | Attending: Obstetrics and Gynecology | Admitting: Obstetrics and Gynecology

## 2020-04-27 DIAGNOSIS — Z01812 Encounter for preprocedural laboratory examination: Secondary | ICD-10-CM | POA: Insufficient documentation

## 2020-04-27 DIAGNOSIS — Z20822 Contact with and (suspected) exposure to covid-19: Secondary | ICD-10-CM | POA: Diagnosis not present

## 2020-04-27 LAB — SARS CORONAVIRUS 2 (TAT 6-24 HRS): SARS Coronavirus 2: NEGATIVE

## 2020-04-27 NOTE — H&P (Signed)
34 y.o. [id number] complains of persistent irregular bleeding and R&LLLQ pain with no relief.  Previously:"Pt. here for consult; "Pt states she is still taking bc and is currently doing the 61mo without a period. Pt states even though she shouldnt be having a period, she is still bleeding. Pt states the severe right sided pain is still present. Sleeps with a heating pad due to pain. Has constantly been bleeding 15+ days out of the month even with the bc"; s/p partial salpingectomy; also c/o increased anxiety and crying spells, on Prozac 20mg  /tb//  Previous 8x5x4; EM 3.48mm; normal uterus and ovaries. Heterogeneous appearance to uterus on pics.  Depression has worsened, not doing well. Pain is still there- heightens with bleeding but there all the time. Pain is what is really bothering her.  D/w pt conservative vs robot TLH/cysto.   No SUI. "  Pt has had 3 c/s for three healthy children.  She has tried OCPs without success and declines Lupron secondary depression may worsen.  88m was looked into but it is too expensive for pt to afford.  She desires definitive and understands that surgery may not help all pain even if endometriosis and that she may need PT.  Ovaries are to be retained unless frankly abnormal or have a large amount of endometriosis involved with them.     Past Medical History:  Diagnosis Date  . GERD (gastroesophageal reflux disease)   . Gestational diabetes   . Headache   . HSV-1 infection   . Inguinal hernia infant   bilateral  . Nephrolithiasis    with first pregnancy  . PONV (postoperative nausea and vomiting)   . Postpartum depression   . Pre-eclampsia   . UTI (lower urinary tract infection) 2006 - 2007    Dr. 05-08-1983 uro eval seconday to frequent infection with negative cysto   Past Surgical History:  Procedure Laterality Date  . CESAREAN SECTION N/A 07/17/2015   Procedure: CESAREAN SECTION;  Surgeon: 09/16/2015, MD;  Location: [facility] ORS;  Service:  Obstetrics;  Laterality: N/A;  . CESAREAN SECTION N/A 01/05/2018   Procedure: REPEAT CESAREAN SECTION;  Surgeon: 03/07/2018, MD;  Location: [facility] BIRTHING SUITES;  Service: Obstetrics;  Laterality: N/A;  . CESAREAN SECTION N/A 02/09/2019   Procedure: CESAREAN SECTION;  Surgeon: 04/11/2019, MD;  Location: [facility] ORS;  Service: Obstetrics;  Laterality: N/A;  . CYSTOSCOPY  10/2005   negative  . INGUINAL HERNIA REPAIR Bilateral infant  . MYRINGOTOMY  2005  . SALPINGECTOMY  2020  . TONSILLECTOMY  age 29  . TYMPANOPLASTY  2006  . WISDOM TOOTH EXTRACTION      Social History   Socioeconomic History  . Marital status: Married    Spouse name: Not on file  . Number of children: 0  . Years of education: Not on file  . Highest education level: Not on file  Occupational History  . Not on file  Tobacco Use  . Smoking status: Former Smoker    Packs/day: 0.50    Years: 8.00    Pack years: 4.00    Types: Cigarettes    Quit date: 08/05/2011    Years since quitting: 8.7  . Smokeless tobacco: Never Used  Vaping Use  . Vaping Use: Never used  Substance and Sexual Activity  . Alcohol use: No  . Drug use: No  . Sexual activity: Yes    Partners: Male    Birth control/protection: Pill  Other Topics Concern  . Not on  file  Social History Narrative  . Not on file   Social Determinants of Health   Financial Resource Strain:   . Difficulty of Paying Living Expenses:   Food Insecurity:   . Worried About Programme researcher, broadcasting/film/video in the Last Year:   . Barista in the Last Year:   Transportation Needs:   . Freight forwarder (Medical):   Marland Kitchen Lack of Transportation (Non-Medical):   Physical Activity:   . Days of Exercise per Week:   . Minutes of Exercise per Session:   Stress:   . Feeling of Stress :   Social Connections:   . Frequency of Communication with Friends and Family:   . Frequency of Social Gatherings with Friends and Family:   . Attends Religious Services:   . Active  Member of Clubs or Organizations:   . Attends Banker Meetings:   Marland Kitchen Marital Status:   Intimate Partner Violence:   . Fear of Current or Ex-Partner:   . Emotionally Abused:   Marland Kitchen Physically Abused:   . Sexually Abused:     No current facility-administered medications on file prior to encounter.   Current Outpatient Medications on File Prior to Encounter  Medication Sig Dispense Refill  . drospirenone-ethinyl estradiol (YAZ) 3-0.02 MG tablet Take 1 tablet by mouth daily.    Marland Kitchen FLUoxetine (PROZAC) 40 MG capsule Take 40 mg by mouth at bedtime.    Marland Kitchen oxyCODONE-acetaminophen (PERCOCET/ROXICET) 5-325 MG tablet Take 1-2 tablets by mouth every 4 (four) hours as needed for moderate pain. (Patient not taking: Reported on 04/09/2020) 20 tablet 0  . Prenatal Vit-Fe Fumarate-FA (PRENATAL 1+1 PO) Prenatal (Patient not taking: Reported on 04/09/2020)      No Known Allergies  There were no vitals filed for this visit.  Lungs: clear to ascultation Cor:  RRR Abdomen:  soft, nontender, nondistended. Ex:  no cords, erythema Pelvic:   Vulva: no masses, no atrophy, no lesions Vagina: no tenderness, no erythema, no abnormal vaginal discharge, no vesicle(s) or ulcers, no cystocele, no rectocele Cervix: grossly normal, no discharge, no cervical motion tenderness Uterus: normal size, normal shape, midline, no uterine prolapse, mobile, non-tender Bladder/Urethra: normal meatus, no urethral discharge, no urethral mass, bladder non distended, Urethra well supported Adnexa/Parametria: no parametrial tenderness, no parametrial mass, no adnexal tenderness, no ovarian mass   A:  Pt with chronic pain with no relief with conservative.  For Robotic TLH/Salpingectomies(had partial in past, will remove residual) cysto.     P: P: All risks, benefits and alternatives d/w patient and she desires to proceed.  Patient has undergone a modified diet, ERAS protocol and will receive preop antibiotics and SCDs during  the operation.   Pt to have extended recovery but will go home same day if eating, ambulating, voiding and pain control is good.  Loney Laurence

## 2020-04-29 ENCOUNTER — Other Ambulatory Visit (HOSPITAL_COMMUNITY): Payer: 59

## 2020-05-01 ENCOUNTER — Encounter (HOSPITAL_BASED_OUTPATIENT_CLINIC_OR_DEPARTMENT_OTHER): Payer: Self-pay | Admitting: Obstetrics and Gynecology

## 2020-05-01 ENCOUNTER — Encounter (HOSPITAL_BASED_OUTPATIENT_CLINIC_OR_DEPARTMENT_OTHER)
Admission: RE | Disposition: A | Discharge status: Home / Self Care | Payer: Self-pay | Source: Home / Self Care | Attending: Obstetrics and Gynecology

## 2020-05-01 ENCOUNTER — Other Ambulatory Visit: Payer: Self-pay

## 2020-05-01 ENCOUNTER — Ambulatory Visit (HOSPITAL_BASED_OUTPATIENT_CLINIC_OR_DEPARTMENT_OTHER): Payer: 59 | Admitting: Certified Registered Nurse Anesthetist

## 2020-05-01 ENCOUNTER — Ambulatory Visit (HOSPITAL_BASED_OUTPATIENT_CLINIC_OR_DEPARTMENT_OTHER)
Admission: RE | Admit: 2020-05-01 | Discharge: 2020-05-01 | Disposition: A | Discharge status: Home / Self Care | Payer: 59 | Attending: Obstetrics and Gynecology | Admitting: Obstetrics and Gynecology

## 2020-05-01 DIAGNOSIS — F329 Major depressive disorder, single episode, unspecified: Secondary | ICD-10-CM | POA: Diagnosis not present

## 2020-05-01 DIAGNOSIS — Z87440 Personal history of urinary (tract) infections: Secondary | ICD-10-CM | POA: Insufficient documentation

## 2020-05-01 DIAGNOSIS — I10 Essential (primary) hypertension: Secondary | ICD-10-CM | POA: Diagnosis not present

## 2020-05-01 DIAGNOSIS — Z793 Long term (current) use of hormonal contraceptives: Secondary | ICD-10-CM | POA: Diagnosis not present

## 2020-05-01 DIAGNOSIS — N803 Endometriosis of pelvic peritoneum: Secondary | ICD-10-CM | POA: Diagnosis not present

## 2020-05-01 DIAGNOSIS — K219 Gastro-esophageal reflux disease without esophagitis: Secondary | ICD-10-CM | POA: Diagnosis not present

## 2020-05-01 DIAGNOSIS — Z79899 Other long term (current) drug therapy: Secondary | ICD-10-CM | POA: Insufficient documentation

## 2020-05-01 DIAGNOSIS — F419 Anxiety disorder, unspecified: Secondary | ICD-10-CM | POA: Diagnosis not present

## 2020-05-01 DIAGNOSIS — N809 Endometriosis, unspecified: Secondary | ICD-10-CM | POA: Insufficient documentation

## 2020-05-01 DIAGNOSIS — Z9889 Other specified postprocedural states: Secondary | ICD-10-CM

## 2020-05-01 DIAGNOSIS — Z87891 Personal history of nicotine dependence: Secondary | ICD-10-CM | POA: Diagnosis not present

## 2020-05-01 HISTORY — PX: ROBOTIC ASSISTED TOTAL HYSTERECTOMY: SHX6085

## 2020-05-01 HISTORY — PX: XI ROBOTIC ASSISTED OOPHORECTOMY: SHX6823

## 2020-05-01 HISTORY — PX: CYSTOSCOPY: SHX5120

## 2020-05-01 LAB — TYPE AND SCREEN
ABO/RH(D): O POS
Antibody Screen: NEGATIVE

## 2020-05-01 LAB — POCT PREGNANCY, URINE: Preg Test, Ur: NEGATIVE

## 2020-05-01 SURGERY — HYSTERECTOMY, TOTAL, ROBOT-ASSISTED
Anesthesia: General | Site: Urethra

## 2020-05-01 MED ORDER — ONDANSETRON HCL 4 MG/2ML IJ SOLN
INTRAMUSCULAR | Status: AC
  Filled 2020-05-01: qty 2

## 2020-05-01 MED ORDER — ONDANSETRON HCL 4 MG/2ML IJ SOLN
INTRAMUSCULAR | Status: DC | PRN
  Administered 2020-05-01 (×2): 4 mg via INTRAVENOUS

## 2020-05-01 MED ORDER — FENTANYL CITRATE (PF) 250 MCG/5ML IJ SOLN
INTRAMUSCULAR | Status: AC
  Filled 2020-05-01: qty 5

## 2020-05-01 MED ORDER — KETOROLAC TROMETHAMINE 15 MG/ML IJ SOLN: 15.0000 mg | INTRAMUSCULAR | Status: DC

## 2020-05-01 MED ORDER — LACTATED RINGERS IV SOLN: INTRAVENOUS | Status: DC

## 2020-05-01 MED ORDER — GABAPENTIN 300 MG PO CAPS
300.0000 mg | ORAL_CAPSULE | ORAL | Status: AC
  Administered 2020-05-01: 300 mg via ORAL

## 2020-05-01 MED ORDER — CEFAZOLIN SODIUM-DEXTROSE 2-4 GM/100ML-% IV SOLN
INTRAVENOUS | Status: AC
  Filled 2020-05-01: qty 100

## 2020-05-01 MED ORDER — IBUPROFEN 800 MG PO TABS: 800.0000 mg | ORAL_TABLET | Freq: Three times a day (TID) | ORAL | Status: DC

## 2020-05-01 MED ORDER — SODIUM CHLORIDE 0.9 % IV SOLN
INTRAVENOUS | Status: DC | PRN
  Administered 2020-05-01: 60 mL

## 2020-05-01 MED ORDER — KETOROLAC TROMETHAMINE 30 MG/ML IJ SOLN
30.0000 mg | Freq: Four times a day (QID) | INTRAMUSCULAR | Status: DC
  Administered 2020-05-01: 30 mg via INTRAVENOUS

## 2020-05-01 MED ORDER — OXYCODONE-ACETAMINOPHEN 5-325 MG PO TABS: 1.0000 | ORAL_TABLET | ORAL | 0 refills | Status: DC | PRN

## 2020-05-01 MED ORDER — SODIUM CHLORIDE 0.9 % IR SOLN
Status: DC | PRN
  Administered 2020-05-01: 3000 mL

## 2020-05-01 MED ORDER — OXYCODONE-ACETAMINOPHEN 5-325 MG PO TABS
ORAL_TABLET | ORAL | Status: AC
  Filled 2020-05-01: qty 1

## 2020-05-01 MED ORDER — PROPOFOL 10 MG/ML IV BOLUS
INTRAVENOUS | Status: AC
  Filled 2020-05-01: qty 20

## 2020-05-01 MED ORDER — KETOROLAC TROMETHAMINE 30 MG/ML IJ SOLN
INTRAMUSCULAR | Status: AC
  Filled 2020-05-01: qty 1

## 2020-05-01 MED ORDER — FLUOXETINE HCL 20 MG PO CAPS
40.0000 mg | ORAL_CAPSULE | Freq: Every day | ORAL | Status: DC
  Filled 2020-05-01: qty 2

## 2020-05-01 MED ORDER — FENTANYL CITRATE (PF) 100 MCG/2ML IJ SOLN
INTRAMUSCULAR | Status: DC | PRN
  Administered 2020-05-01 (×2): 50 ug via INTRAVENOUS
  Administered 2020-05-01: 100 ug via INTRAVENOUS
  Administered 2020-05-01: 50 ug via INTRAVENOUS

## 2020-05-01 MED ORDER — DIPHENHYDRAMINE HCL 50 MG/ML IJ SOLN
INTRAMUSCULAR | Status: DC | PRN
  Administered 2020-05-01: 12.5 mg via INTRAVENOUS

## 2020-05-01 MED ORDER — DEXAMETHASONE SODIUM PHOSPHATE 10 MG/ML IJ SOLN
INTRAMUSCULAR | Status: AC
  Filled 2020-05-01: qty 1

## 2020-05-01 MED ORDER — MIDAZOLAM HCL 2 MG/2ML IJ SOLN
INTRAMUSCULAR | Status: DC | PRN
  Administered 2020-05-01: 2 mg via INTRAVENOUS

## 2020-05-01 MED ORDER — ROCURONIUM BROMIDE 10 MG/ML (PF) SYRINGE
PREFILLED_SYRINGE | INTRAVENOUS | Status: AC
  Filled 2020-05-01: qty 10

## 2020-05-01 MED ORDER — CELECOXIB 200 MG PO CAPS
ORAL_CAPSULE | ORAL | Status: AC
  Filled 2020-05-01: qty 2

## 2020-05-01 MED ORDER — SENNOSIDES-DOCUSATE SODIUM 8.6-50 MG PO TABS
1.0000 | ORAL_TABLET | Freq: Every evening | ORAL | Status: DC | PRN
  Filled 2020-05-01: qty 1

## 2020-05-01 MED ORDER — PROPOFOL 10 MG/ML IV BOLUS
INTRAVENOUS | Status: DC | PRN
  Administered 2020-05-01: 150 mg via INTRAVENOUS

## 2020-05-01 MED ORDER — LIDOCAINE 2% (20 MG/ML) 5 ML SYRINGE
INTRAMUSCULAR | Status: AC
  Filled 2020-05-01: qty 5

## 2020-05-01 MED ORDER — LIDOCAINE 2% (20 MG/ML) 5 ML SYRINGE
INTRAMUSCULAR | Status: DC | PRN
  Administered 2020-05-01: 80 mg via INTRAVENOUS

## 2020-05-01 MED ORDER — OXYCODONE-ACETAMINOPHEN 5-325 MG PO TABS
1.0000 | ORAL_TABLET | ORAL | Status: DC | PRN
  Administered 2020-05-01: 1 via ORAL

## 2020-05-01 MED ORDER — HYDROMORPHONE HCL 1 MG/ML IJ SOLN: 0.2500 mg | INTRAMUSCULAR | Status: DC | PRN

## 2020-05-01 MED ORDER — INDIGOTINDISULFONATE SODIUM 8 MG/ML IJ SOLN
INTRAMUSCULAR | Status: DC | PRN
  Administered 2020-05-01: 1 mL via INTRAVENOUS

## 2020-05-01 MED ORDER — ACETAMINOPHEN 500 MG PO TABS
1000.0000 mg | ORAL_TABLET | ORAL | Status: AC
  Administered 2020-05-01: 1000 mg via ORAL

## 2020-05-01 MED ORDER — DEXAMETHASONE SODIUM PHOSPHATE 10 MG/ML IJ SOLN
INTRAMUSCULAR | Status: DC | PRN
  Administered 2020-05-01: 10 mg via INTRAVENOUS

## 2020-05-01 MED ORDER — DIPHENHYDRAMINE HCL 50 MG/ML IJ SOLN
INTRAMUSCULAR | Status: AC
  Filled 2020-05-01: qty 1

## 2020-05-01 MED ORDER — GABAPENTIN 300 MG PO CAPS
ORAL_CAPSULE | ORAL | Status: AC
  Filled 2020-05-01: qty 1

## 2020-05-01 MED ORDER — MIDAZOLAM HCL 2 MG/2ML IJ SOLN
INTRAMUSCULAR | Status: AC
  Filled 2020-05-01: qty 2

## 2020-05-01 MED ORDER — ONDANSETRON HCL 4 MG/2ML IJ SOLN
4.0000 mg | Freq: Four times a day (QID) | INTRAMUSCULAR | Status: DC | PRN
  Administered 2020-05-01: 4 mg via INTRAVENOUS

## 2020-05-01 MED ORDER — OXYCODONE-ACETAMINOPHEN 5-325 MG PO TABS: 1.0000 | ORAL_TABLET | ORAL | Status: DC | PRN

## 2020-05-01 MED ORDER — CELECOXIB 200 MG PO CAPS
400.0000 mg | ORAL_CAPSULE | ORAL | Status: AC
  Administered 2020-05-01: 400 mg via ORAL

## 2020-05-01 MED ORDER — SOD CITRATE-CITRIC ACID 500-334 MG/5ML PO SOLN: 30.0000 mL | ORAL | Status: DC

## 2020-05-01 MED ORDER — SUGAMMADEX SODIUM 200 MG/2ML IV SOLN
INTRAVENOUS | Status: DC | PRN
  Administered 2020-05-01: 200 mg via INTRAVENOUS

## 2020-05-01 MED ORDER — MENTHOL 3 MG MT LOZG: 1.0000 | LOZENGE | OROMUCOSAL | Status: DC | PRN

## 2020-05-01 MED ORDER — ONDANSETRON HCL 4 MG PO TABS: 4.0000 mg | ORAL_TABLET | Freq: Four times a day (QID) | ORAL | Status: DC | PRN

## 2020-05-01 MED ORDER — ACETAMINOPHEN 500 MG PO TABS
ORAL_TABLET | ORAL | Status: AC
  Filled 2020-05-01: qty 2

## 2020-05-01 MED ORDER — CEFAZOLIN SODIUM-DEXTROSE 2-4 GM/100ML-% IV SOLN
2.0000 g | INTRAVENOUS | Status: AC
  Administered 2020-05-01: 2 g via INTRAVENOUS

## 2020-05-01 MED ORDER — ROCURONIUM BROMIDE 10 MG/ML (PF) SYRINGE
PREFILLED_SYRINGE | INTRAVENOUS | Status: DC | PRN
  Administered 2020-05-01: 50 mg via INTRAVENOUS
  Administered 2020-05-01: 20 mg via INTRAVENOUS

## 2020-05-01 SURGICAL SUPPLY — 57 items
ADH SKN CLS APL DERMABOND .7 (GAUZE/BANDAGES/DRESSINGS) ×3
APL SRG 38 LTWT LNG FL B (MISCELLANEOUS) ×3
APPLICATOR ARISTA FLEXITIP XL (MISCELLANEOUS) ×4 IMPLANT
COVER BACK TABLE 60X90IN (DRAPES) ×5 IMPLANT
COVER TIP SHEARS 8 DVNC (MISCELLANEOUS) ×3 IMPLANT
COVER TIP SHEARS 8MM DA VINCI (MISCELLANEOUS) ×5
DECANTER SPIKE VIAL GLASS SM (MISCELLANEOUS) ×5 IMPLANT
DEFOGGER SCOPE WARMER CLEARIFY (MISCELLANEOUS) ×5 IMPLANT
DERMABOND ADVANCED (GAUZE/BANDAGES/DRESSINGS) ×2
DERMABOND ADVANCED .7 DNX12 (GAUZE/BANDAGES/DRESSINGS) ×3 IMPLANT
DRAPE ARM DVNC X/XI (DISPOSABLE) ×12 IMPLANT
DRAPE COLUMN DVNC XI (DISPOSABLE) ×3 IMPLANT
DRAPE DA VINCI XI ARM (DISPOSABLE) ×20
DRAPE DA VINCI XI COLUMN (DISPOSABLE) ×5
DRAPE UTILITY XL STRL (DRAPES) ×5 IMPLANT
DURAPREP 26ML APPLICATOR (WOUND CARE) ×5 IMPLANT
ELECT REM PT RETURN 9FT ADLT (ELECTROSURGICAL) ×5
ELECTRODE REM PT RTRN 9FT ADLT (ELECTROSURGICAL) ×3 IMPLANT
GLOVE BIO SURGEON STRL SZ 6 (GLOVE) ×4 IMPLANT
GLOVE BIO SURGEON STRL SZ7 (GLOVE) ×17 IMPLANT
GLOVE BIOGEL PI IND STRL 6 (GLOVE) ×1 IMPLANT
GLOVE BIOGEL PI IND STRL 7.0 (GLOVE) ×8 IMPLANT
GLOVE BIOGEL PI IND STRL 7.5 (GLOVE) ×4 IMPLANT
GLOVE BIOGEL PI INDICATOR 6 (GLOVE) ×2
GLOVE BIOGEL PI INDICATOR 7.0 (GLOVE) ×6
GLOVE BIOGEL PI INDICATOR 7.5 (GLOVE) ×4
GLOVE ECLIPSE 7.5 STRL STRAW (GLOVE) ×2 IMPLANT
HEMOSTAT ARISTA ABSORB 3G PWDR (HEMOSTASIS) ×5 IMPLANT
HOLDER FOLEY CATH W/STRAP (MISCELLANEOUS) ×4 IMPLANT
IRRIG SUCT STRYKERFLOW 2 WTIP (MISCELLANEOUS) ×5
IRRIGATION SUCT STRKRFLW 2 WTP (MISCELLANEOUS) ×3 IMPLANT
LEGGING LITHOTOMY PAIR STRL (DRAPES) ×5 IMPLANT
MANIFOLD NEPTUNE II (INSTRUMENTS) ×4 IMPLANT
MANIPULATOR ADVINCU DEL 2.5 PL (MISCELLANEOUS) ×4 IMPLANT
NEEDLE INSUFFLATION 120MM (ENDOMECHANICALS) ×5 IMPLANT
NS IRRIG 1000ML POUR BTL (IV SOLUTION) ×4 IMPLANT
OBTURATOR OPTICAL STANDARD 8MM (TROCAR) ×5
OBTURATOR OPTICAL STND 8 DVNC (TROCAR) ×3
OBTURATOR OPTICALSTD 8 DVNC (TROCAR) ×3 IMPLANT
PACK ROBOT WH (CUSTOM PROCEDURE TRAY) ×5 IMPLANT
PACK ROBOTIC GOWN (GOWN DISPOSABLE) ×5 IMPLANT
PACK TRENDGUARD 450 HYBRID PRO (MISCELLANEOUS) ×2 IMPLANT
PAD PREP 24X48 CUFFED NSTRL (MISCELLANEOUS) ×5 IMPLANT
POUCH LAPAROSCOPIC INSTRUMENT (MISCELLANEOUS) ×5 IMPLANT
PROTECTOR NERVE ULNAR (MISCELLANEOUS) ×10 IMPLANT
SEAL CANN UNIV 5-8 DVNC XI (MISCELLANEOUS) ×9 IMPLANT
SEAL XI 5MM-8MM UNIVERSAL (MISCELLANEOUS) ×15
SET IRRIG Y TYPE TUR BLADDER L (SET/KITS/TRAYS/PACK) ×5 IMPLANT
SET TRI-LUMEN FLTR TB AIRSEAL (TUBING) ×5 IMPLANT
SUT VIC AB 0 CT1 36 (SUTURE) ×5 IMPLANT
SUT VICRYL RAPIDE 3 0 (SUTURE) ×10 IMPLANT
SUT VLOC 180 0 9IN  GS21 (SUTURE) ×5
SUT VLOC 180 0 9IN GS21 (SUTURE) ×3 IMPLANT
TOWEL OR 17X26 10 PK STRL BLUE (TOWEL DISPOSABLE) ×5 IMPLANT
TRAY FOLEY W/BAG SLVR 14FR LF (SET/KITS/TRAYS/PACK) ×3 IMPLANT
TRENDGUARD 450 HYBRID PRO PACK (MISCELLANEOUS) ×5
TROCAR PORT AIRSEAL 5X120 (TROCAR) ×5 IMPLANT

## 2020-05-01 NOTE — Brief Op Note (Signed)
05/01/2020  11:14 AM  PATIENT:  Traci Sweeney  35 y.o. female  PRE-OPERATIVE DIAGNOSIS:  endometriosis  POST-OPERATIVE DIAGNOSIS:  endometriosis  PROCEDURE:  Procedure(s) with comments: XI ROBOTIC ASSISTED TOTAL HYSTERECTOMY (N/A) - cautery of endometriosis CYSTOSCOPY (N/A) XI ROBOTIC ASSISTED SALPINGECTOMIES (Bilateral)  SURGEON:  Surgeon(s) and Role:    * Carrington Clamp, MD - Primary    * Taam-Akelman, Griselda Miner, MD - Assisting  ANESTHESIA:   general  EBL:  75 mL   LOCAL MEDICATIONS USED:  OTHER Ropivicaine  SPECIMEN:  Source of Specimen:  uterus, cervix, bilateral tubes  DISPOSITION OF SPECIMEN:  PATHOLOGY  COUNTS:  YES  TOURNIQUET:  * No tourniquets in log *  DICTATION: .Note written in EPIC  PLAN OF CARE: Admit for overnight observation  PATIENT DISPOSITION:  PACU - hemodynamically stable.   Delay start of Pharmacological VTE agent (>24hrs) due to surgical blood loss or risk of bleeding: not applicable

## 2020-05-01 NOTE — Op Note (Signed)
05/01/2020  11:14 AM  PATIENT:  Traci Sweeney  34 y.o. female  PRE-OPERATIVE DIAGNOSIS:  endometriosis  POST-OPERATIVE DIAGNOSIS:  endometriosis  PROCEDURE:  Procedure(s) with comments: XI ROBOTIC ASSISTED TOTAL HYSTERECTOMY (N/A) - cautery of endometriosis CYSTOSCOPY (N/A) XI ROBOTIC ASSISTED SALPINGECTOMIES (Bilateral)  SURGEON:  Surgeon(s) and Role:    * Carrington Clamp, MD - Primary    * Taam-Akelman, Griselda Miner, MD - Assisting  ANESTHESIA:   general  EBL:  75 mL   LOCAL MEDICATIONS USED:  OTHER Ropivicaine  SPECIMEN:  Source of Specimen:  uterus, cervix, bilateral tubes  DISPOSITION OF SPECIMEN:  PATHOLOGY  COUNTS:  YES  TOURNIQUET:  * No tourniquets in log *  DICTATION: .Note written in EPIC  PLAN OF CARE: Admit for overnight observation  PATIENT DISPOSITION:  PACU - hemodynamically stable.   Delay start of Pharmacological VTE agent (>24hrs) due to surgical blood loss or risk of bleeding: not applicable  Complications:  None.  Findings:  6 weeks size uterus.  Ovaries were normal.  The ureters were identified during multiple points of the case and were always out of the field of dissection.  On cystoscopy, the bladder was intact and bilateral spill was seen from each ureteral orriface.    Medications:  Ancef.  Ropivicaine.      Technique:   After adequate anesthesia was achieved the patient was positioned, prepped and draped in usual sterile fashion.  A speculum was placed in the vagina and the cervix dilated with pratt dilators.  The 2.5 cm Koh ring Advincula was assembled and placed in proper fashion.  The  Speculum was removed and the bladder catheterized with a foley.     Attention was turned to the abdomen where a 1 cm incision was made 1 cm above the umbilicus.  The veress needle was introduced without aspiration of bowel contents or blood and the abdomen insufflated. The 8.5 mm Robotic trocar was placed and the other three trocar sites were marked out,  all approximately 10 cm from each other and the camera.  Two 8.5 mm trocars were placed on either side of the camera port and a 5 mm assistant port was placed 3 cm above the line of the other trocars.  All trocars were inserted under direct visualization of the camera.  The patient was placed in trendelenburg and then the Robot docked.  The fenestrated bipolar were placed on arm 1 and the Hot shears on arm 3 and introduced under direct visualization of the camera.   I then broke scrub and sat down at the console.  The above findings were noted and the ureters identified well out of the field of dissection.  The right fallopian tube was isolated and cauterized with the bipolar.  The Utero-ovarian ligament was then divided with the bipolar cautery and shears.  The posterior broad ligament was then divided with the hot shears until the uterosacral ligament.  The Broad and cardinal ligaments were then cauterized against the cervix to the level of the Koh ring, securing the uterine artery.  Each pedicle was then incised with the shears.  The anterior leaf was then incised at the reflection of the vessico-uterine junction and the lateral bladder retracted inferiorly after the round ligament had been divided with the bipolar forceps.  The left tube was cauterized with the bipolar and divided with the shears;  then the left utero-ovarian ligament divided with the bipolar forceps and the scissors.  The round ligament was divided as well  and the posterior leaf of the broad ligament then divided with the hot shears. The broad and cardinal ligaments were then cauterized on the left in the same way.   At the level of the internal os, the uterine arteries were bilaterally cauterized with the bipolar.  The ureters were identified well out of the field of dissection.     The bladder was then able to be retracted inferiorly with careful dissection of dense adhesions from prior cesarean sections.  The vesico-uterine fascia was  incised in the midline until the bladder was removed one cm below the Koh ring.  The hot shears then circumferentially incised the vagina at the level of the reflection on the [facility] ring.  Once the uterus and cervix were amputated, cautery was used to insure hemostasis of the cuff.  Once hemostasis was achieved, the scissors were changed to the mega suture cut needle driver and the cuff was closed with a running stitches of 0-vicryl V loc.  Cautery was used to ensure hemostasis of the left pedicles very superficially. The ureters were peristalsing bilaterally well and very lateral to the areas of operation.     The Robot was then undocked and I scrubbed back in.  The needle was removed and Ropivicaine was introduced into the pelvis. The skin incisions were closed with subcuticular stitches of 3-0 vicryl Rapide and Dermabond.  All instruments were removed from the vagina and cystoscopy performed, revealing an intact bladder and vigourous spill of urine from each ureteral orifice.  The cystoscope was removed and the patient taken to the recovery room in stable condition.   Annaliese Saez A

## 2020-05-01 NOTE — Discharge Instructions (Signed)
Laparoscopically Assisted Vaginal Hysterectomy, Care After This sheet gives you information about how to care for yourself after your procedure. Your health care provider may also give you more specific instructions. If you have problems or questions, contact your health care provider. What can I expect after the procedure? After the procedure, it is common to have:  Soreness and numbness in your incision areas.  Abdominal pain. You will be given pain medicine to control it.  Vaginal bleeding and discharge. You will need to use a sanitary napkin after this procedure.  Sore throat from the breathing tube that was inserted during surgery. Follow these instructions at home: Medicines  Take over-the-counter and prescription medicines only as told by your health care provider.  Do not take aspirin or ibuprofen. These medicines can cause bleeding.  Do not drive or use heavy machinery while taking prescription pain medicine.  Do not drive for 24 hours if you were given a medicine to help you relax (sedative) during the procedure. Incision care   Follow instructions from your health care provider about how to take care of your incisions. Make sure you: ? Wash your hands with soap and water before you change your bandage (dressing). If soap and water are not available, use hand sanitizer. ? Change your dressing as told by your health care provider. ? Leave stitches (sutures), skin glue, or adhesive strips in place. These skin closures may need to stay in place for 2 weeks or longer. If adhesive strip edges start to loosen and curl up, you may trim the loose edges. Do not remove adhesive strips completely unless your health care provider tells you to do that.  Check your incision area every day for signs of infection. Check for: ? Redness, swelling, or pain. ? Fluid or blood. ? Warmth. ? Pus or a bad smell. Activity  Get regular exercise as told by your health care provider. You may be  told to take short walks every day and go farther each time.  Return to your normal activities as told by your health care provider. Ask your health care provider what activities are safe for you.  Do not douche, use tampons, or have sexual intercourse for at least 6 weeks, or until your health care provider gives you permission.  Do not lift anything that is heavier than 10 lb (4.5 kg), or the limit that your health care provider tells you, until he or she says that it is safe. General instructions  Do not take baths, swim, or use a hot tub until your health care provider approves. Take showers instead of baths.  Do not drive for 24 hours if you received a sedative.  Do not drive or operate heavy machinery while taking prescription pain medicine.  To prevent or treat constipation while you are taking prescription pain medicine, your health care provider may recommend that you: ? Drink enough fluid to keep your urine clear or pale yellow. ? Take over-the-counter or prescription medicines. ? Eat foods that are high in fiber, such as fresh fruits and vegetables, whole grains, and beans. ? Limit foods that are high in fat and processed sugars, such as fried and sweet foods.  Keep all follow-up visits as told by your health care provider. This is important. Contact a health care provider if:  You have signs of infection, such as: ? Redness, swelling, or pain around your incision sites. ? Fluid or blood coming from an incision. ? An incision that feels warm to the   touch. ? Pus or a bad smell coming from an incision.  Your incision breaks open.  Your pain medicine is not helping.  You feel dizzy or light-headed.  You have pain or bleeding when you urinate.  You have persistent nausea and vomiting.  You have blood, pus, or a bad-smelling discharge from your vagina. Get help right away if:  You have a fever.  You have severe abdominal pain.  You have chest pain.  You have  shortness of breath.  You faint.  You have pain, swelling, or redness in your leg.  You have heavy bleeding from your vagina. Summary  After the procedure, it is common to have abdominal pain and vaginal bleeding.  You should not drive or lift heavy objects until your health care provider says that it is safe.  Contact your health care provider if you have any symptoms of infection, excessive vaginal bleeding, nausea, vomiting, or shortness of breath. This information is not intended to replace advice given to you by your health care provider. Make sure you discuss any questions you have with your health care provider. Document Revised: 09/03/2017 Document Reviewed: 11/17/2016 Elsevier Patient Education  2020 [employer]  

## 2020-05-01 NOTE — Progress Notes (Signed)
There has been no change in the patients history, status or exam since the history and physical.  Vitals:   05/01/20 0833  BP: 123/79  Pulse: 72  Resp: 16  Temp: 97.8 F (36.6 C)  TempSrc: Oral  SpO2: 100%  Weight: 76.2 kg  Height: 5\' 4"  (1.626 m)    Results for orders placed or performed during the hospital encounter of 05/01/20 (from the past 72 hour(s))  Pregnancy, urine POC     Status: None   Collection Time: 05/01/20  8:01 AM  Result Value Ref Range   Preg Test, Ur NEGATIVE NEGATIVE    Comment:        THE SENSITIVITY OF THIS METHODOLOGY IS >24 mIU/mL     05/03/20

## 2020-05-01 NOTE — Anesthesia Preprocedure Evaluation (Addendum)
Anesthesia Evaluation  Patient identified by MRN, date of birth, ID band Patient awake    Reviewed: Allergy & Precautions, NPO status , Patient's Chart, lab work & pertinent test results  History of Anesthesia Complications (+) PONV  Airway Mallampati: II  TM Distance: >3 FB     Dental   Pulmonary neg pulmonary ROS, former smoker,    breath sounds clear to auscultation       Cardiovascular hypertension,  Rhythm:Regular Rate:Normal     Neuro/Psych  Headaches, Depression    GI/Hepatic Neg liver ROS, GERD  ,  Endo/Other  diabetes  Renal/GU Renal disease     Musculoskeletal   Abdominal   Peds  Hematology   Anesthesia Other Findings   Reproductive/Obstetrics                             Anesthesia Physical Anesthesia Plan  ASA: III  Anesthesia Plan: General   Post-op Pain Management:    Induction: Intravenous  PONV Risk Score and Plan: 4 or greater and Ondansetron, Dexamethasone and Midazolam  Airway Management Planned: Double Lumen EBT  Additional Equipment:   Intra-op Plan:   Post-operative Plan: Extubation in OR  Informed Consent: I have reviewed the patients History and Physical, chart, labs and discussed the procedure including the risks, benefits and alternatives for the proposed anesthesia with the patient or authorized representative who has indicated his/her understanding and acceptance.     Dental advisory given  Plan Discussed with: CRNA, Anesthesiologist and Surgeon  Anesthesia Plan Comments:        Anesthesia Quick Evaluation

## 2020-05-01 NOTE — Transfer of Care (Signed)
Immediate Anesthesia Transfer of Care Note  Patient: Raynisha A Maclachlan  Procedure(s) Performed: XI ROBOTIC ASSISTED TOTAL HYSTERECTOMY (N/A Abdomen) CYSTOSCOPY (N/A Urethra) XI ROBOTIC ASSISTED SALPINGECTOMIES (Bilateral Abdomen)  Patient Location: PACU  Anesthesia Type:General  Level of Consciousness: awake, alert  and oriented  Airway & Oxygen Therapy: Patient Spontanous Breathing and Patient connected to face mask oxygen  Post-op Assessment: Report given to RN and Post -op Vital signs reviewed and stable  Post vital signs: Reviewed and stable  Last Vitals:  Vitals Value Taken Time  BP 110/72 05/01/20 1130  Temp 36.9 C 05/01/20 1127  Pulse 88 05/01/20 1134  Resp 23 05/01/20 1134  SpO2 98 % 05/01/20 1134  Vitals shown include unvalidated device data.  Last Pain:  Vitals:   05/01/20 0833  TempSrc: Oral  PainSc: 0-No pain      Patients Stated Pain Goal: 4 (05/01/20 9470)  Complications: No complications documented.

## 2020-05-01 NOTE — Anesthesia Procedure Notes (Signed)
Procedure Name: Intubation Date/Time: 05/01/2020 9:40 AM Performed by: Pearson Grippe, CRNA Pre-anesthesia Checklist: Patient identified, Emergency Drugs available, Suction available and Patient being monitored Patient Re-evaluated:Patient Re-evaluated prior to induction Oxygen Delivery Method: Circle system utilized Preoxygenation: Pre-oxygenation with 100% oxygen Induction Type: IV induction Ventilation: Mask ventilation without difficulty Laryngoscope Size: Miller and 2 Grade View: Grade I Tube type: Oral Tube size: 7.0 mm Number of attempts: 1 Airway Equipment and Method: Stylet and Oral airway Placement Confirmation: ETT inserted through vocal cords under direct vision,  positive ETCO2 and breath sounds checked- equal and bilateral Secured at: 21 cm Tube secured with: Tape Dental Injury: Teeth and Oropharynx as per pre-operative assessment

## 2020-05-02 LAB — SURGICAL PATHOLOGY

## 2020-05-03 NOTE — Anesthesia Postprocedure Evaluation (Signed)
Anesthesia Post Note  Patient: Traci Sweeney  Procedure(s) Performed: XI ROBOTIC ASSISTED TOTAL HYSTERECTOMY (N/A Abdomen) CYSTOSCOPY (N/A Urethra) XI ROBOTIC ASSISTED SALPINGECTOMIES (Bilateral Abdomen)     Anesthesia Post Evaluation No complications documented.  Last Vitals:  Vitals:   05/01/20 1351 05/01/20 1618  BP: 107/66 111/69  Pulse: 65 84  Resp: 16 16  Temp: 37.1 C 36.7 C  SpO2: 97% 98%    Last Pain:  Vitals:   05/02/20 0955  TempSrc:   PainSc: 2                  Murvin Gift

## 2020-05-06 ENCOUNTER — Encounter (HOSPITAL_BASED_OUTPATIENT_CLINIC_OR_DEPARTMENT_OTHER): Payer: Self-pay | Admitting: Obstetrics and Gynecology

## 2020-05-10 NOTE — Discharge Summary (Signed)
Physician Discharge Summary  Patient ID: Traci Sweeney MRN: [id number] DOB/AGE: Aug 25, 1986 34 y.o.  Admit date: 05/01/2020 Discharge date: 05/10/2020  Admission Diagnoses:suspected endometriosis  Discharge Diagnoses: menorrhagia and pelvic pain Active Problems:   Postoperative state   Discharged Condition: good  Hospital Course: robotic assisted total laparoscopic hysterectomy with bilateral salpingectomies, cystoscopy.  Significant Diagnostic Studies: none  Treatments: surgery:  robotic assisted total laparoscopic hysterectomy with bilateral salpingectomies, cystoscopy.  Discharge Exam: Blood pressure 111/69, pulse 84, temperature 98.1 F (36.7 C), resp. rate 16, height 5\' 4"  (1.626 m), weight 76.2 kg, last menstrual period 03/01/2020, SpO2 98 %, unknown if currently breastfeeding.   Disposition: Discharge disposition: 01-Home or Self Care       Discharge Instructions    Call MD for:  temperature >100.4   Complete by: As directed    Diet - low sodium heart healthy   Complete by: As directed    Discharge instructions   Complete by: As directed    Routine activity.  Take over the counter ibuprophen up to 800 mg every 8 hours for pain.   Discharge wound care:   Complete by: As directed    Sitz baths and icepacks to perineum.  If stitches, they will dissolve.     Allergies as of 05/01/2020   No Known Allergies     Medication List    TAKE these medications   drospirenone-ethinyl estradiol 3-0.02 MG tablet Commonly known as: YAZ Take 1 tablet by mouth daily.   FLUoxetine 40 MG capsule Commonly known as: PROZAC Take 40 mg by mouth at bedtime.   oxyCODONE-acetaminophen 5-325 MG tablet Commonly known as: PERCOCET/ROXICET Take 1 tablet by mouth every 4 (four) hours as needed for moderate pain. What changed: how much to take   PRENATAL 1+1 PO Prenatal   traMADol 50 MG tablet Commonly known as: ULTRAM Take by mouth every 6 (six) hours as needed.             Discharge Care Instructions  (From admission, onward)         Start     Ordered   05/01/20 0000  Discharge wound care:       Comments: Sitz baths and icepacks to perineum.  If stitches, they will dissolve.   05/01/20 1125           Signed: 05/03/20 05/10/2020, 10:02 AM

## 2020-05-30 DIAGNOSIS — M25551 Pain in right hip: Secondary | ICD-10-CM | POA: Diagnosis not present

## 2020-05-30 DIAGNOSIS — M533 Sacrococcygeal disorders, not elsewhere classified: Secondary | ICD-10-CM | POA: Diagnosis not present

## 2020-05-31 ENCOUNTER — Other Ambulatory Visit: Payer: Self-pay | Admitting: Orthopedic Surgery

## 2020-05-31 DIAGNOSIS — M533 Sacrococcygeal disorders, not elsewhere classified: Secondary | ICD-10-CM

## 2020-06-11 ENCOUNTER — Ambulatory Visit
Admission: RE | Admit: 2020-06-11 | Discharge: 2020-06-11 | Disposition: A | Discharge status: Home / Self Care | Payer: 59 | Source: Ambulatory Visit | Attending: Orthopedic Surgery | Admitting: Orthopedic Surgery

## 2020-06-11 ENCOUNTER — Other Ambulatory Visit: Payer: Self-pay

## 2020-06-11 DIAGNOSIS — M533 Sacrococcygeal disorders, not elsewhere classified: Secondary | ICD-10-CM

## 2020-06-20 DIAGNOSIS — M5414 Radiculopathy, thoracic region: Secondary | ICD-10-CM | POA: Diagnosis not present

## 2020-06-20 DIAGNOSIS — M9902 Segmental and somatic dysfunction of thoracic region: Secondary | ICD-10-CM | POA: Diagnosis not present

## 2020-06-20 DIAGNOSIS — M545 Low back pain: Secondary | ICD-10-CM | POA: Diagnosis not present

## 2020-06-20 DIAGNOSIS — M546 Pain in thoracic spine: Secondary | ICD-10-CM | POA: Diagnosis not present

## 2020-06-20 DIAGNOSIS — M461 Sacroiliitis, not elsewhere classified: Secondary | ICD-10-CM | POA: Diagnosis not present

## 2020-06-20 DIAGNOSIS — M9903 Segmental and somatic dysfunction of lumbar region: Secondary | ICD-10-CM | POA: Diagnosis not present

## 2020-06-20 DIAGNOSIS — M9904 Segmental and somatic dysfunction of sacral region: Secondary | ICD-10-CM | POA: Diagnosis not present

## 2020-06-24 DIAGNOSIS — M545 Low back pain: Secondary | ICD-10-CM | POA: Diagnosis not present

## 2020-06-24 DIAGNOSIS — M9902 Segmental and somatic dysfunction of thoracic region: Secondary | ICD-10-CM | POA: Diagnosis not present

## 2020-06-24 DIAGNOSIS — M5414 Radiculopathy, thoracic region: Secondary | ICD-10-CM | POA: Diagnosis not present

## 2020-06-24 DIAGNOSIS — M9904 Segmental and somatic dysfunction of sacral region: Secondary | ICD-10-CM | POA: Diagnosis not present

## 2020-06-24 DIAGNOSIS — M546 Pain in thoracic spine: Secondary | ICD-10-CM | POA: Diagnosis not present

## 2020-06-24 DIAGNOSIS — M461 Sacroiliitis, not elsewhere classified: Secondary | ICD-10-CM | POA: Diagnosis not present

## 2020-06-24 DIAGNOSIS — M9903 Segmental and somatic dysfunction of lumbar region: Secondary | ICD-10-CM | POA: Diagnosis not present

## 2020-06-27 DIAGNOSIS — M533 Sacrococcygeal disorders, not elsewhere classified: Secondary | ICD-10-CM | POA: Diagnosis not present

## 2020-06-27 DIAGNOSIS — M5414 Radiculopathy, thoracic region: Secondary | ICD-10-CM | POA: Diagnosis not present

## 2020-06-27 DIAGNOSIS — M9903 Segmental and somatic dysfunction of lumbar region: Secondary | ICD-10-CM | POA: Diagnosis not present

## 2020-06-27 DIAGNOSIS — M461 Sacroiliitis, not elsewhere classified: Secondary | ICD-10-CM | POA: Diagnosis not present

## 2020-06-27 DIAGNOSIS — M25551 Pain in right hip: Secondary | ICD-10-CM | POA: Diagnosis not present

## 2020-06-27 DIAGNOSIS — M9902 Segmental and somatic dysfunction of thoracic region: Secondary | ICD-10-CM | POA: Diagnosis not present

## 2020-06-27 DIAGNOSIS — M545 Low back pain: Secondary | ICD-10-CM | POA: Diagnosis not present

## 2020-06-27 DIAGNOSIS — M546 Pain in thoracic spine: Secondary | ICD-10-CM | POA: Diagnosis not present

## 2020-06-27 DIAGNOSIS — M9904 Segmental and somatic dysfunction of sacral region: Secondary | ICD-10-CM | POA: Diagnosis not present

## 2020-07-01 DIAGNOSIS — M9904 Segmental and somatic dysfunction of sacral region: Secondary | ICD-10-CM | POA: Diagnosis not present

## 2020-07-01 DIAGNOSIS — M5414 Radiculopathy, thoracic region: Secondary | ICD-10-CM | POA: Diagnosis not present

## 2020-07-01 DIAGNOSIS — M9903 Segmental and somatic dysfunction of lumbar region: Secondary | ICD-10-CM | POA: Diagnosis not present

## 2020-07-01 DIAGNOSIS — M546 Pain in thoracic spine: Secondary | ICD-10-CM | POA: Diagnosis not present

## 2020-07-01 DIAGNOSIS — M545 Low back pain: Secondary | ICD-10-CM | POA: Diagnosis not present

## 2020-07-01 DIAGNOSIS — M9902 Segmental and somatic dysfunction of thoracic region: Secondary | ICD-10-CM | POA: Diagnosis not present

## 2020-07-01 DIAGNOSIS — M461 Sacroiliitis, not elsewhere classified: Secondary | ICD-10-CM | POA: Diagnosis not present

## 2020-07-04 DIAGNOSIS — M5414 Radiculopathy, thoracic region: Secondary | ICD-10-CM | POA: Diagnosis not present

## 2020-07-04 DIAGNOSIS — M546 Pain in thoracic spine: Secondary | ICD-10-CM | POA: Diagnosis not present

## 2020-07-04 DIAGNOSIS — M9903 Segmental and somatic dysfunction of lumbar region: Secondary | ICD-10-CM | POA: Diagnosis not present

## 2020-07-04 DIAGNOSIS — M9902 Segmental and somatic dysfunction of thoracic region: Secondary | ICD-10-CM | POA: Diagnosis not present

## 2020-07-04 DIAGNOSIS — M545 Low back pain: Secondary | ICD-10-CM | POA: Diagnosis not present

## 2020-07-04 DIAGNOSIS — M461 Sacroiliitis, not elsewhere classified: Secondary | ICD-10-CM | POA: Diagnosis not present

## 2020-07-04 DIAGNOSIS — M9904 Segmental and somatic dysfunction of sacral region: Secondary | ICD-10-CM | POA: Diagnosis not present

## 2020-07-08 DIAGNOSIS — M5451 Vertebrogenic low back pain: Secondary | ICD-10-CM | POA: Diagnosis not present

## 2020-07-08 DIAGNOSIS — M5414 Radiculopathy, thoracic region: Secondary | ICD-10-CM | POA: Diagnosis not present

## 2020-07-08 DIAGNOSIS — M9904 Segmental and somatic dysfunction of sacral region: Secondary | ICD-10-CM | POA: Diagnosis not present

## 2020-07-08 DIAGNOSIS — M461 Sacroiliitis, not elsewhere classified: Secondary | ICD-10-CM | POA: Diagnosis not present

## 2020-07-08 DIAGNOSIS — M9902 Segmental and somatic dysfunction of thoracic region: Secondary | ICD-10-CM | POA: Diagnosis not present

## 2020-07-08 DIAGNOSIS — M546 Pain in thoracic spine: Secondary | ICD-10-CM | POA: Diagnosis not present

## 2020-07-08 DIAGNOSIS — M9903 Segmental and somatic dysfunction of lumbar region: Secondary | ICD-10-CM | POA: Diagnosis not present

## 2020-07-09 DIAGNOSIS — H6983 Other specified disorders of Eustachian tube, bilateral: Secondary | ICD-10-CM | POA: Diagnosis not present

## 2020-07-09 DIAGNOSIS — H93293 Other abnormal auditory perceptions, bilateral: Secondary | ICD-10-CM | POA: Diagnosis not present

## 2020-07-09 DIAGNOSIS — H938X2 Other specified disorders of left ear: Secondary | ICD-10-CM | POA: Diagnosis not present

## 2020-07-11 DIAGNOSIS — M5451 Vertebrogenic low back pain: Secondary | ICD-10-CM | POA: Diagnosis not present

## 2020-07-11 DIAGNOSIS — M5414 Radiculopathy, thoracic region: Secondary | ICD-10-CM | POA: Diagnosis not present

## 2020-07-11 DIAGNOSIS — M9902 Segmental and somatic dysfunction of thoracic region: Secondary | ICD-10-CM | POA: Diagnosis not present

## 2020-07-11 DIAGNOSIS — M9903 Segmental and somatic dysfunction of lumbar region: Secondary | ICD-10-CM | POA: Diagnosis not present

## 2020-07-11 DIAGNOSIS — M546 Pain in thoracic spine: Secondary | ICD-10-CM | POA: Diagnosis not present

## 2020-07-11 DIAGNOSIS — M461 Sacroiliitis, not elsewhere classified: Secondary | ICD-10-CM | POA: Diagnosis not present

## 2020-07-11 DIAGNOSIS — M9904 Segmental and somatic dysfunction of sacral region: Secondary | ICD-10-CM | POA: Diagnosis not present

## 2020-07-16 DIAGNOSIS — M9903 Segmental and somatic dysfunction of lumbar region: Secondary | ICD-10-CM | POA: Diagnosis not present

## 2020-07-16 DIAGNOSIS — M546 Pain in thoracic spine: Secondary | ICD-10-CM | POA: Diagnosis not present

## 2020-07-16 DIAGNOSIS — M461 Sacroiliitis, not elsewhere classified: Secondary | ICD-10-CM | POA: Diagnosis not present

## 2020-07-16 DIAGNOSIS — M9902 Segmental and somatic dysfunction of thoracic region: Secondary | ICD-10-CM | POA: Diagnosis not present

## 2020-07-16 DIAGNOSIS — M5451 Vertebrogenic low back pain: Secondary | ICD-10-CM | POA: Diagnosis not present

## 2020-07-16 DIAGNOSIS — M25551 Pain in right hip: Secondary | ICD-10-CM | POA: Diagnosis not present

## 2020-07-16 DIAGNOSIS — M5414 Radiculopathy, thoracic region: Secondary | ICD-10-CM | POA: Diagnosis not present

## 2020-07-16 DIAGNOSIS — M9904 Segmental and somatic dysfunction of sacral region: Secondary | ICD-10-CM | POA: Diagnosis not present

## 2020-07-23 DIAGNOSIS — M9904 Segmental and somatic dysfunction of sacral region: Secondary | ICD-10-CM | POA: Diagnosis not present

## 2020-07-23 DIAGNOSIS — M461 Sacroiliitis, not elsewhere classified: Secondary | ICD-10-CM | POA: Diagnosis not present

## 2020-07-23 DIAGNOSIS — M9903 Segmental and somatic dysfunction of lumbar region: Secondary | ICD-10-CM | POA: Diagnosis not present

## 2020-07-23 DIAGNOSIS — M5451 Vertebrogenic low back pain: Secondary | ICD-10-CM | POA: Diagnosis not present

## 2020-07-23 DIAGNOSIS — M546 Pain in thoracic spine: Secondary | ICD-10-CM | POA: Diagnosis not present

## 2020-07-23 DIAGNOSIS — M5414 Radiculopathy, thoracic region: Secondary | ICD-10-CM | POA: Diagnosis not present

## 2020-07-23 DIAGNOSIS — M9902 Segmental and somatic dysfunction of thoracic region: Secondary | ICD-10-CM | POA: Diagnosis not present

## 2020-07-24 DIAGNOSIS — M533 Sacrococcygeal disorders, not elsewhere classified: Secondary | ICD-10-CM | POA: Diagnosis not present

## 2020-07-24 DIAGNOSIS — M25551 Pain in right hip: Secondary | ICD-10-CM | POA: Diagnosis not present

## 2020-07-25 ENCOUNTER — Other Ambulatory Visit: Payer: Self-pay

## 2020-07-25 ENCOUNTER — Ambulatory Visit
Admission: RE | Admit: 2020-07-25 | Discharge: 2020-07-25 | Disposition: A | Discharge status: Home / Self Care | Payer: 59 | Source: Ambulatory Visit | Attending: Chiropractic Medicine | Admitting: Chiropractic Medicine

## 2020-07-25 ENCOUNTER — Other Ambulatory Visit: Payer: Self-pay | Admitting: Chiropractic Medicine

## 2020-07-25 ENCOUNTER — Ambulatory Visit
Admission: RE | Admit: 2020-07-25 | Discharge: 2020-07-25 | Disposition: A | Discharge status: Home / Self Care | Payer: 59 | Attending: Chiropractic Medicine | Admitting: Chiropractic Medicine

## 2020-07-25 DIAGNOSIS — M9903 Segmental and somatic dysfunction of lumbar region: Secondary | ICD-10-CM

## 2020-07-25 DIAGNOSIS — M5414 Radiculopathy, thoracic region: Secondary | ICD-10-CM | POA: Diagnosis not present

## 2020-07-25 DIAGNOSIS — M546 Pain in thoracic spine: Secondary | ICD-10-CM | POA: Diagnosis not present

## 2020-07-25 DIAGNOSIS — M9902 Segmental and somatic dysfunction of thoracic region: Secondary | ICD-10-CM | POA: Diagnosis not present

## 2020-07-25 DIAGNOSIS — M545 Low back pain, unspecified: Secondary | ICD-10-CM

## 2020-07-25 DIAGNOSIS — M461 Sacroiliitis, not elsewhere classified: Secondary | ICD-10-CM | POA: Diagnosis not present

## 2020-07-25 DIAGNOSIS — M9904 Segmental and somatic dysfunction of sacral region: Secondary | ICD-10-CM | POA: Diagnosis not present

## 2020-07-25 DIAGNOSIS — M5451 Vertebrogenic low back pain: Secondary | ICD-10-CM | POA: Diagnosis not present

## 2020-08-06 DIAGNOSIS — M546 Pain in thoracic spine: Secondary | ICD-10-CM | POA: Diagnosis not present

## 2020-08-06 DIAGNOSIS — M461 Sacroiliitis, not elsewhere classified: Secondary | ICD-10-CM | POA: Diagnosis not present

## 2020-08-06 DIAGNOSIS — M5451 Vertebrogenic low back pain: Secondary | ICD-10-CM | POA: Diagnosis not present

## 2020-08-06 DIAGNOSIS — M9902 Segmental and somatic dysfunction of thoracic region: Secondary | ICD-10-CM | POA: Diagnosis not present

## 2020-08-06 DIAGNOSIS — M9904 Segmental and somatic dysfunction of sacral region: Secondary | ICD-10-CM | POA: Diagnosis not present

## 2020-08-06 DIAGNOSIS — M5414 Radiculopathy, thoracic region: Secondary | ICD-10-CM | POA: Diagnosis not present

## 2020-08-06 DIAGNOSIS — M9903 Segmental and somatic dysfunction of lumbar region: Secondary | ICD-10-CM | POA: Diagnosis not present

## 2020-08-07 DIAGNOSIS — M25551 Pain in right hip: Secondary | ICD-10-CM | POA: Diagnosis not present

## 2020-09-26 DIAGNOSIS — J069 Acute upper respiratory infection, unspecified: Secondary | ICD-10-CM | POA: Diagnosis not present

## 2020-09-26 DIAGNOSIS — R059 Cough, unspecified: Secondary | ICD-10-CM | POA: Diagnosis not present

## 2020-09-26 DIAGNOSIS — Z20822 Contact with and (suspected) exposure to covid-19: Secondary | ICD-10-CM | POA: Diagnosis not present

## 2021-03-28 IMAGING — CT CT BIOPSY
1 of 9 series · 6 of 32 positions shown, 11 images · non-contrast
Comparison: none

CLINICAL DATA: Right SI and hip pain.

[Series 2: needle -guided injection · axial · 0.82mm/px · z∈[-76,-6]mm · 6 of 49 slices shown, 11 images]
[im 7/49  soft-tissue]
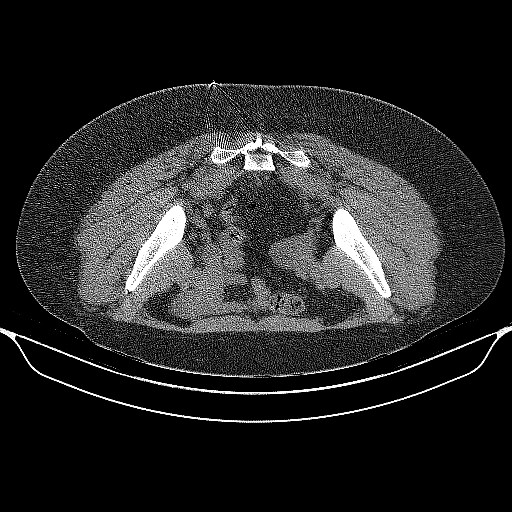
[im 7/49  bone]
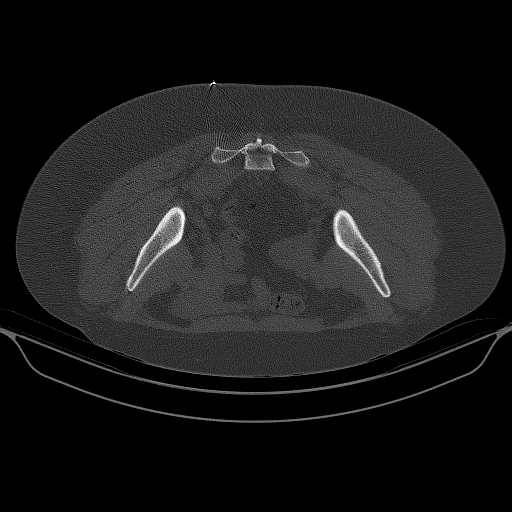
[im 14/49  soft-tissue]
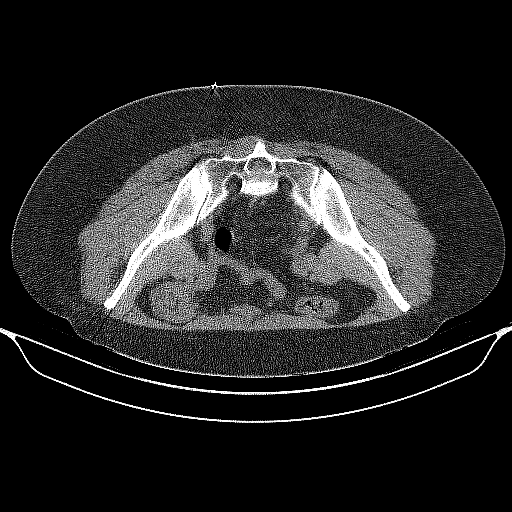
[im 21/49  soft-tissue]
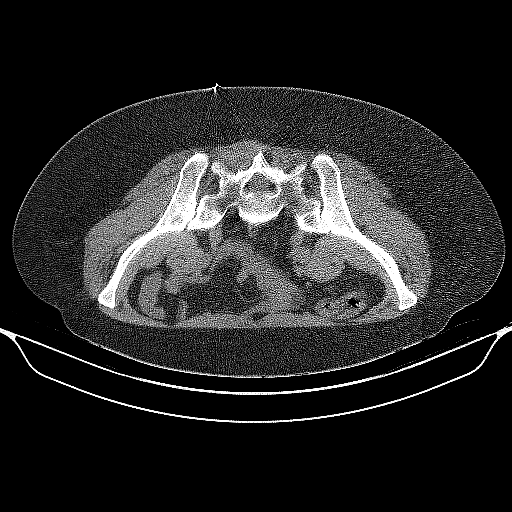
[im 21/49  lung]
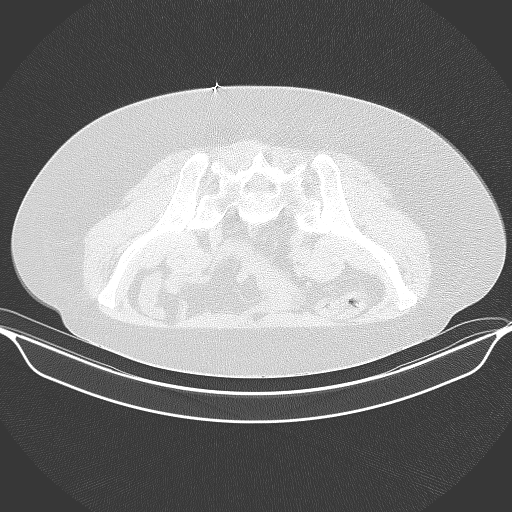
[im 28/49  soft-tissue]
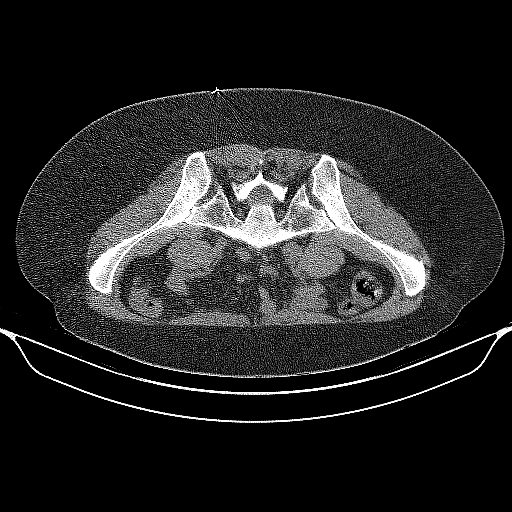
[im 28/49  lung]
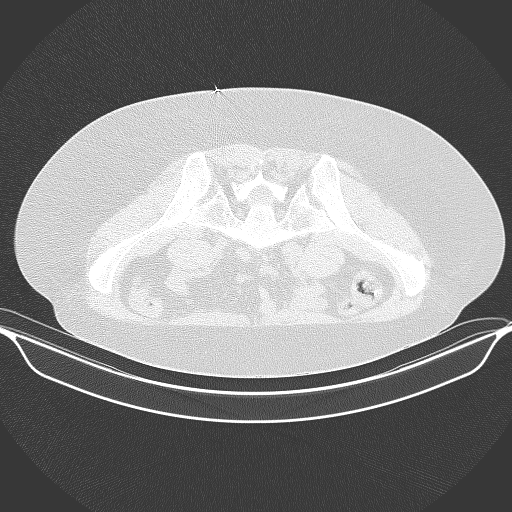
[im 35/49  soft-tissue]
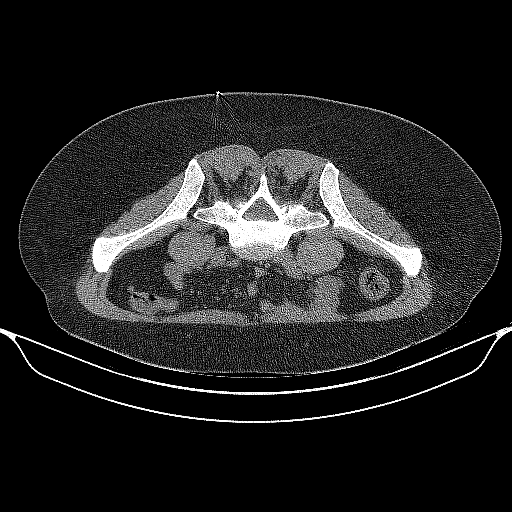
[im 35/49  lung]
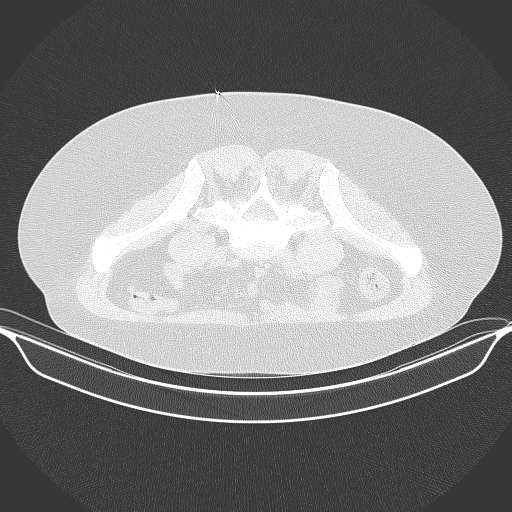
[im 42/49  soft-tissue]
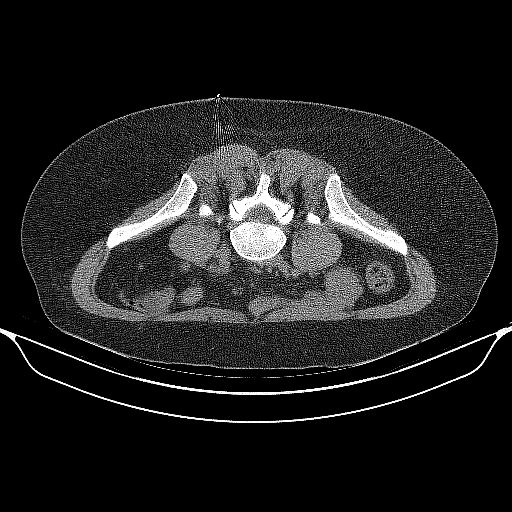
[im 42/49  lung]
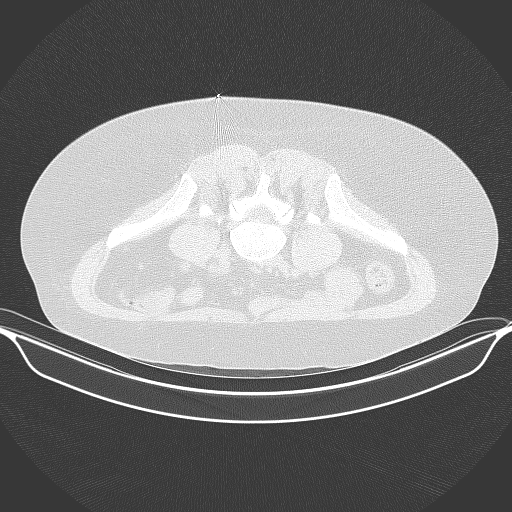

[6 of 32 positions shown; findings below may reference images not displayed]

EXAM:
CT GUIDED RIGHT SI JOINT INJECTION

PROCEDURE:
After a thorough discussion of risks and benefits of the procedure,
including bleeding, infection, injury to nerves, blood vessels, and
adjacent structures, verbal and written consent was obtained. The
patient was placed prone on the CT table and localization was
performed over the sacrum. Target site marked using CT guidance. The
skin was prepped and draped in the usual sterile fashion using
Betadine soap.

After local anesthesia with 1% lidocaine without epinephrine and
subsequent deep anesthesia, a 3.5 inch 22 gauge spinal needle was
advanced into the right SI joint under intermittent CT guidance.

Despite the needle being within the joint, injection of Isovue-M 200
initially demonstrated contrast predominantly extravasating outside
of the joint posteriorly. The needle was repositioned several times
until intra-articular contrast spread was obtained. Subsequently, 2
mL of 0.5% bupivacaine were injected into the right SI joint.
Needles removed and a sterile dressing applied.

No complications were observed.
IMPRESSION: Successful CT-guided right SI joint injection.

## 2021-04-29 NOTE — Progress Notes (Signed)
Surgical Instructions   Your procedure is scheduled on Tuesday, May 06, 2021.  Report to [facility] Main Entrance "A" at 10:30 A.M., then check in with the Admitting office.  Call [phone] if you have problems or questions between now and the morning of surgery:   Remember: Do not eat after midnight the night before your surgery  You may drink clear liquids until 09:30am the morning of your surgery.   Clear liquids allowed are: Water, Non-Citrus Juices (without pulp), Carbonated Beverages, Clear Tea, Black Coffee Only, and Gatorade   Take these medicines the morning of surgery with A SIP OF WATER - NONE   If needed you may take these medications the morning of surgery: Acetaminophen (Tylenol) Diphenhydramine (Benadryl)   As of today, STOP taking any Aspirin (unless otherwise instructed by your surgeon) or Aspirin-containing products; NSAIDS - Aleve, Naproxen, Ibuprofen, Motrin, Advil, Goody's, BC's, all herbal medications, fish oil, and all vitamins.          Do not wear jewelry or makeup Do not wear lotions, powders, perfumes, or deodorant. Do not shave legs and underarms 48 hours prior to surgery.   Do not wear nail polish, gel polish, artificial nails, or any other type of covering on natural nails including fingernails and toenails. If patients have artificial nails, gel coating, etc. that need to be removed by a nail salon please have this removed prior to surgery or surgery may need to be canceled/delayed if the surgeon/ anesthesia feels like the patient is unable to be adequately monitored. Do not bring valuables to the hospital - [facility] is not responsible for any belongings or valuables.              Do NOT Smoke (Tobacco/Vaping) or drink Alcohol 24 hours prior to your procedure  If you use a CPAP at night, you may bring all equipment for your overnight stay.   Contacts, glasses, hearing aids, dentures or partials may not be worn into surgery, please bring  cases for these belongings   For patients admitted to the hospital, discharge time will be determined by your treatment team.   Patients discharged the day of surgery will not be allowed to drive home, and someone needs to stay with them for 24 hours.  ONLY ONE (1) SUPPORT PERSON MAY WAIT IN THE WAITING AREA WHILE YOU ARE IN SURGERY. NO VISITORS WILL BE ALLOWED IN PRE-OP WHERE PATIENTS GET READY FOR SURGERY.  TWO (2) VISITORS WILL BE ALLOWED IN YOUR ROOM IF YOU ARE ADMITTED AFTER SURGERY.  Minor children may have two parents present. Special consideration for safety and communication needs will be reviewed on a case by case basis.  Special instructions:    Oral Hygiene is also important to reduce your risk of infection.  Remember - BRUSH YOUR TEETH THE MORNING OF SURGERY WITH YOUR REGULAR TOOTHPASTE   [facility]- Preparing For Surgery  Before surgery, you can play an important role. Because skin is not sterile, your skin needs to be as free of germs as possible. You can reduce the number of germs on your skin by washing with CHG (chlorahexidine gluconate) Soap before surgery.  CHG is an antiseptic cleaner which kills germs and bonds with the skin to continue killing germs even after washing.     Please do not use if you have an allergy to CHG or antibacterial soaps. If your skin becomes reddened/irritated stop using the CHG.  Do not shave (including legs and underarms) for at least  48 hours prior to first CHG shower. It is OK to shave your face.  Please follow these instructions carefully.     Shower the NIGHT BEFORE SURGERY and the MORNING OF SURGERY with CHG Soap.   If you chose to wash your hair, wash your hair first as usual with your normal shampoo. After you shampoo, rinse your hair and body thoroughly to remove the shampoo.    Then [employer] and genitals (private parts) with your normal soap and rinse thoroughly to remove soap.  Next use the CHG Soap as you would any other  liquid soap. You can apply CHG directly to the skin and wash gently with a clean washcloth.   Apply the CHG Soap to your body ONLY FROM THE NECK DOWN.  Do not use on open wounds or open sores. Avoid contact with your eyes, ears, mouth and genitals (private parts). Wash Face and genitals (private parts)  with your normal soap.   Wash thoroughly, paying special attention to the area where your surgery will be performed.  Thoroughly rinse your body with warm water from the neck down.  DO NOT shower/wash with your normal soap after using and rinsing off the CHG Soap.  Pat yourself dry with a CLEAN TOWEL.  Wear CLEAN PAJAMAS to bed the night before surgery  Place CLEAN SHEETS on your bed the night before your surgery  DO NOT SLEEP WITH PETS.   Day of Surgery:  Take a shower with CHG soap. Wear Clean/Comfortable clothing the morning of surgery Do not apply any deodorants/lotions.   Remember to brush your teeth WITH YOUR REGULAR TOOTHPASTE.   Please read over the following fact sheets that you were given.

## 2021-04-30 ENCOUNTER — Encounter (HOSPITAL_COMMUNITY): Payer: Self-pay

## 2021-04-30 ENCOUNTER — Other Ambulatory Visit: Payer: Self-pay

## 2021-04-30 ENCOUNTER — Encounter (HOSPITAL_COMMUNITY)
Admission: RE | Admit: 2021-04-30 | Discharge: 2021-04-30 | Disposition: A | Discharge status: Home / Self Care | Payer: 59 | Source: Ambulatory Visit | Attending: Orthopedic Surgery | Admitting: Orthopedic Surgery

## 2021-04-30 DIAGNOSIS — Z01812 Encounter for preprocedural laboratory examination: Secondary | ICD-10-CM | POA: Diagnosis not present

## 2021-04-30 HISTORY — DX: Personal history of urinary calculi: Z87.442

## 2021-04-30 LAB — CBC
HCT: 35.1 % — ABNORMAL LOW (ref 36.0–46.0)
Hemoglobin: 10.7 g/dL — ABNORMAL LOW (ref 12.0–15.0)
MCH: 24 pg — ABNORMAL LOW (ref 26.0–34.0)
MCHC: 30.5 g/dL (ref 30.0–36.0)
MCV: 78.7 fL — ABNORMAL LOW (ref 80.0–100.0)
Platelets: 387 10*3/uL (ref 150–400)
RBC: 4.46 MIL/uL (ref 3.87–5.11)
RDW: 15 % (ref 11.5–15.5)
WBC: 6.5 10*3/uL (ref 4.0–10.5)
nRBC: 0 % (ref 0.0–0.2)

## 2021-04-30 NOTE — Progress Notes (Signed)
PCP: Uses urgent care as needed Cardiologist: denies  EKG: na CXR: na ECHO: denies Stress Test: denies Cardiac Cath: denies  Fasting Blood Sugar- na Checks Blood Sugar__na_ times a day  OSA/CPAP: No  ASA/Blood Thinner: No  Covid test not needed  Anesthesia Review: No  Patient denies shortness of breath, fever, cough, and chest pain at PAT appointment.  Patient verbalized understanding of instructions provided today at the PAT appointment.  Patient asked to review instructions at home and day of surgery.

## 2021-05-06 ENCOUNTER — Encounter (HOSPITAL_COMMUNITY): Payer: Self-pay | Admitting: Orthopedic Surgery

## 2021-05-06 ENCOUNTER — Encounter (HOSPITAL_COMMUNITY)
Admission: RE | Disposition: A | Discharge status: Home / Self Care | Payer: Self-pay | Source: Home / Self Care | Attending: Orthopedic Surgery

## 2021-05-06 ENCOUNTER — Ambulatory Visit (HOSPITAL_COMMUNITY): Payer: 59 | Admitting: Anesthesiology

## 2021-05-06 ENCOUNTER — Other Ambulatory Visit: Payer: Self-pay

## 2021-05-06 ENCOUNTER — Ambulatory Visit (HOSPITAL_COMMUNITY): Payer: 59

## 2021-05-06 ENCOUNTER — Ambulatory Visit (HOSPITAL_COMMUNITY)
Admission: RE | Admit: 2021-05-06 | Discharge: 2021-05-06 | Disposition: A | Discharge status: Home / Self Care | Payer: 59 | Attending: Orthopedic Surgery | Admitting: Orthopedic Surgery

## 2021-05-06 DIAGNOSIS — Z98891 History of uterine scar from previous surgery: Secondary | ICD-10-CM | POA: Insufficient documentation

## 2021-05-06 DIAGNOSIS — Z8759 Personal history of other complications of pregnancy, childbirth and the puerperium: Secondary | ICD-10-CM | POA: Diagnosis not present

## 2021-05-06 DIAGNOSIS — M25851 Other specified joint disorders, right hip: Secondary | ICD-10-CM | POA: Diagnosis not present

## 2021-05-06 DIAGNOSIS — Z79899 Other long term (current) drug therapy: Secondary | ICD-10-CM | POA: Diagnosis not present

## 2021-05-06 DIAGNOSIS — S73191A Other sprain of right hip, initial encounter: Secondary | ICD-10-CM | POA: Diagnosis present

## 2021-05-06 DIAGNOSIS — Z90710 Acquired absence of both cervix and uterus: Secondary | ICD-10-CM | POA: Insufficient documentation

## 2021-05-06 DIAGNOSIS — Z8632 Personal history of gestational diabetes: Secondary | ICD-10-CM | POA: Insufficient documentation

## 2021-05-06 DIAGNOSIS — Z419 Encounter for procedure for purposes other than remedying health state, unspecified: Secondary | ICD-10-CM

## 2021-05-06 DIAGNOSIS — Z90721 Acquired absence of ovaries, unilateral: Secondary | ICD-10-CM | POA: Diagnosis not present

## 2021-05-06 DIAGNOSIS — Z87891 Personal history of nicotine dependence: Secondary | ICD-10-CM | POA: Insufficient documentation

## 2021-05-06 DIAGNOSIS — X58XXXA Exposure to other specified factors, initial encounter: Secondary | ICD-10-CM | POA: Insufficient documentation

## 2021-05-06 DIAGNOSIS — Z833 Family history of diabetes mellitus: Secondary | ICD-10-CM | POA: Diagnosis not present

## 2021-05-06 HISTORY — PX: HIP ARTHROSCOPY: SHX668

## 2021-05-06 LAB — TYPE AND SCREEN
ABO/RH(D): O POS
Antibody Screen: NEGATIVE

## 2021-05-06 SURGERY — ARTHROSCOPY HIP
Anesthesia: General | Site: Hip | Laterality: Right

## 2021-05-06 MED ORDER — FENTANYL CITRATE (PF) 100 MCG/2ML IJ SOLN
INTRAMUSCULAR | Status: DC | PRN
  Administered 2021-05-06: 100 ug via INTRAVENOUS
  Administered 2021-05-06: 50 ug via INTRAVENOUS
  Administered 2021-05-06: 100 ug via INTRAVENOUS

## 2021-05-06 MED ORDER — CHLORHEXIDINE GLUCONATE 0.12 % MT SOLN
15.0000 mL | Freq: Once | OROMUCOSAL | Status: AC
  Administered 2021-05-06: 15 mL via OROMUCOSAL
  Filled 2021-05-06: qty 15

## 2021-05-06 MED ORDER — MIDAZOLAM HCL 2 MG/2ML IJ SOLN: 0.5000 mg | Freq: Once | INTRAMUSCULAR | Status: DC | PRN

## 2021-05-06 MED ORDER — SCOPOLAMINE 1 MG/3DAYS TD PT72
1.0000 | MEDICATED_PATCH | TRANSDERMAL | Status: DC
  Administered 2021-05-06: 1.5 mg via TRANSDERMAL
  Filled 2021-05-06: qty 1

## 2021-05-06 MED ORDER — ROCURONIUM BROMIDE 100 MG/10ML IV SOLN
INTRAVENOUS | Status: DC | PRN
  Administered 2021-05-06: 60 mg via INTRAVENOUS

## 2021-05-06 MED ORDER — DOXYCYCLINE HYCLATE 20 MG PO TABS: 20.0000 mg | ORAL_TABLET | Freq: Every day | ORAL | 0 refills | Status: AC

## 2021-05-06 MED ORDER — OXYCODONE HCL 5 MG/5ML PO SOLN: 5.0000 mg | Freq: Once | ORAL | Status: DC | PRN

## 2021-05-06 MED ORDER — HYDROMORPHONE HCL 1 MG/ML IJ SOLN
0.2500 mg | INTRAMUSCULAR | Status: DC | PRN
  Administered 2021-05-06 (×2): 0.25 mg via INTRAVENOUS

## 2021-05-06 MED ORDER — EPHEDRINE 5 MG/ML INJ
INTRAVENOUS | Status: AC
  Filled 2021-05-06: qty 5

## 2021-05-06 MED ORDER — CEFAZOLIN SODIUM-DEXTROSE 2-4 GM/100ML-% IV SOLN: 2.0000 g | INTRAVENOUS | Status: DC

## 2021-05-06 MED ORDER — CEFAZOLIN SODIUM-DEXTROSE 2-4 GM/100ML-% IV SOLN
2.0000 g | INTRAVENOUS | Status: AC
  Administered 2021-05-06: 2 g via INTRAVENOUS
  Filled 2021-05-06: qty 100

## 2021-05-06 MED ORDER — ACETAMINOPHEN 500 MG PO TABS
1000.0000 mg | ORAL_TABLET | Freq: Once | ORAL | Status: AC
  Administered 2021-05-06: 1000 mg via ORAL
  Filled 2021-05-06: qty 2

## 2021-05-06 MED ORDER — LIDOCAINE HCL (CARDIAC) PF 100 MG/5ML IV SOSY
PREFILLED_SYRINGE | INTRAVENOUS | Status: DC | PRN
  Administered 2021-05-06: 40 mg via INTRAVENOUS

## 2021-05-06 MED ORDER — METHOCARBAMOL 500 MG PO TABS: 500.0000 mg | ORAL_TABLET | Freq: Four times a day (QID) | ORAL | 1 refills | Status: DC | PRN

## 2021-05-06 MED ORDER — SUGAMMADEX SODIUM 500 MG/5ML IV SOLN
INTRAVENOUS | Status: DC | PRN
  Administered 2021-05-06: 200 mg via INTRAVENOUS

## 2021-05-06 MED ORDER — AMISULPRIDE (ANTIEMETIC) 5 MG/2ML IV SOLN
INTRAVENOUS | Status: AC
  Filled 2021-05-06: qty 2

## 2021-05-06 MED ORDER — ONDANSETRON HCL 4 MG/2ML IJ SOLN
INTRAMUSCULAR | Status: DC | PRN
  Administered 2021-05-06: 4 mg via INTRAVENOUS

## 2021-05-06 MED ORDER — LIDOCAINE 2% (20 MG/ML) 5 ML SYRINGE
INTRAMUSCULAR | Status: AC
  Filled 2021-05-06: qty 5

## 2021-05-06 MED ORDER — OXYCODONE HCL 5 MG PO TABS: 5.0000 mg | ORAL_TABLET | Freq: Once | ORAL | Status: DC | PRN

## 2021-05-06 MED ORDER — ONDANSETRON 4 MG PO TBDP: 4.0000 mg | ORAL_TABLET | Freq: Three times a day (TID) | ORAL | 0 refills | Status: DC | PRN

## 2021-05-06 MED ORDER — ROCURONIUM BROMIDE 10 MG/ML (PF) SYRINGE
PREFILLED_SYRINGE | INTRAVENOUS | Status: AC
  Filled 2021-05-06: qty 10

## 2021-05-06 MED ORDER — BUPIVACAINE-EPINEPHRINE (PF) 0.25% -1:200000 IJ SOLN
INTRAMUSCULAR | Status: AC
  Filled 2021-05-06: qty 30

## 2021-05-06 MED ORDER — DEXAMETHASONE SODIUM PHOSPHATE 10 MG/ML IJ SOLN
INTRAMUSCULAR | Status: AC
  Filled 2021-05-06: qty 1

## 2021-05-06 MED ORDER — MIDAZOLAM HCL 2 MG/2ML IJ SOLN
INTRAMUSCULAR | Status: AC
  Filled 2021-05-06: qty 2

## 2021-05-06 MED ORDER — OXYCODONE HCL 5 MG PO TABS: 5.0000 mg | ORAL_TABLET | ORAL | 0 refills | Status: DC | PRN

## 2021-05-06 MED ORDER — LACTATED RINGERS IV SOLN: INTRAVENOUS | Status: DC

## 2021-05-06 MED ORDER — HYDROMORPHONE HCL 1 MG/ML IJ SOLN
INTRAMUSCULAR | Status: AC
  Filled 2021-05-06: qty 1

## 2021-05-06 MED ORDER — PROMETHAZINE HCL 25 MG/ML IJ SOLN: 6.2500 mg | INTRAMUSCULAR | Status: DC | PRN

## 2021-05-06 MED ORDER — AMISULPRIDE (ANTIEMETIC) 5 MG/2ML IV SOLN
5.0000 mg | Freq: Once | INTRAVENOUS | Status: AC
  Administered 2021-05-06: 5 mg via INTRAVENOUS

## 2021-05-06 MED ORDER — PROPOFOL 10 MG/ML IV BOLUS
INTRAVENOUS | Status: DC | PRN
  Administered 2021-05-06: 100 mg via INTRAVENOUS

## 2021-05-06 MED ORDER — ORAL CARE MOUTH RINSE: 15.0000 mL | Freq: Once | OROMUCOSAL | Status: AC

## 2021-05-06 MED ORDER — PHENYLEPHRINE 40 MCG/ML (10ML) SYRINGE FOR IV PUSH (FOR BLOOD PRESSURE SUPPORT)
PREFILLED_SYRINGE | INTRAVENOUS | Status: DC | PRN
  Administered 2021-05-06: 60 ug via INTRAVENOUS
  Administered 2021-05-06: 80 ug via INTRAVENOUS
  Administered 2021-05-06: 140 ug via INTRAVENOUS
  Administered 2021-05-06: 120 ug via INTRAVENOUS

## 2021-05-06 MED ORDER — ONDANSETRON HCL 4 MG/2ML IJ SOLN
INTRAMUSCULAR | Status: AC
  Filled 2021-05-06: qty 2

## 2021-05-06 MED ORDER — PROPOFOL 10 MG/ML IV BOLUS
INTRAVENOUS | Status: AC
  Filled 2021-05-06: qty 40

## 2021-05-06 MED ORDER — DEXAMETHASONE SODIUM PHOSPHATE 10 MG/ML IJ SOLN
INTRAMUSCULAR | Status: DC | PRN
  Administered 2021-05-06: 5 mg via INTRAVENOUS

## 2021-05-06 MED ORDER — MIDAZOLAM HCL 5 MG/5ML IJ SOLN
INTRAMUSCULAR | Status: DC | PRN
  Administered 2021-05-06: 2 mg via INTRAVENOUS

## 2021-05-06 MED ORDER — FENTANYL CITRATE (PF) 250 MCG/5ML IJ SOLN
INTRAMUSCULAR | Status: AC
  Filled 2021-05-06: qty 5

## 2021-05-06 MED ORDER — BUPIVACAINE-EPINEPHRINE 0.25% -1:200000 IJ SOLN
INTRAMUSCULAR | Status: DC | PRN
  Administered 2021-05-06: 20 mL

## 2021-05-06 MED ORDER — SODIUM CHLORIDE 0.9 % IR SOLN
Status: DC | PRN
  Administered 2021-05-06 (×2): 6000 mL

## 2021-05-06 MED ORDER — INDOMETHACIN 50 MG PO CAPS: 50.0000 mg | ORAL_CAPSULE | Freq: Two times a day (BID) | ORAL | 0 refills | Status: AC

## 2021-05-06 MED ORDER — MEPERIDINE HCL 25 MG/ML IJ SOLN: 6.2500 mg | INTRAMUSCULAR | Status: DC | PRN

## 2021-05-06 MED ORDER — EPHEDRINE SULFATE 50 MG/ML IJ SOLN
INTRAMUSCULAR | Status: DC | PRN
  Administered 2021-05-06 (×2): 10 mg via INTRAVENOUS
  Administered 2021-05-06: 5 mg via INTRAVENOUS

## 2021-05-06 SURGICAL SUPPLY — 49 items
ANCH SUT 1.8 SLF BUNCH FBRTK (Anchor) ×2 IMPLANT
ANCH SUT MN 8.9 PEEK (Anchor) ×1 IMPLANT
ANCHOR SUT 2.4X8.9 MINI PEEK (Anchor) ×1 IMPLANT
ANCHOR SUT FIBERTAK 1.8 MT KL (Anchor) ×4 IMPLANT
APL PRP STRL LF DISP 70% ISPRP (MISCELLANEOUS) ×1
BAG COUNTER SPONGE SURGICOUNT (BAG) ×1 IMPLANT
BAG SPNG CNTER NS LX DISP (BAG) ×1
BLADE SURG 11 STRL SS (BLADE) ×2 IMPLANT
BNDG COHESIVE 3X5 TAN STRL LF (GAUZE/BANDAGES/DRESSINGS) ×2 IMPLANT
BNDG COHESIVE 6X5 TAN NS LF (GAUZE/BANDAGES/DRESSINGS) ×2 IMPLANT
BUR ROUND HI FLUTE 8 4X19 (BURR) ×1 IMPLANT
BURR ROUND HI FLUTE 8 4X19 (BURR) ×2
CHLORAPREP W/TINT 26 (MISCELLANEOUS) ×2 IMPLANT
CLSR STERI-STRIP ANTIMIC 1/2X4 (GAUZE/BANDAGES/DRESSINGS) ×2 IMPLANT
DECANTER SPIKE VIAL GLASS SM (MISCELLANEOUS) ×2 IMPLANT
DISSECTOR 4.2MMX19CM CVD HL (ORTHOPEDIC DISPOSABLE SUPPLIES) ×1 IMPLANT
DISSECTOR 4.2MMX19CM HL (MISCELLANEOUS) ×2 IMPLANT
DRAPE C-ARM 42X72 X-RAY (DRAPES) ×2 IMPLANT
DRAPE STERI IOBAN 125X83 (DRAPES) ×2 IMPLANT
DRAPE U-SHAPE 47X51 STRL (DRAPES) ×4 IMPLANT
DRSG TEGADERM 4X4.75 (GAUZE/BANDAGES/DRESSINGS) ×6 IMPLANT
GAUZE SPONGE 4X4 12PLY STRL (GAUZE/BANDAGES/DRESSINGS) ×2 IMPLANT
GLOVE SRG 8 PF TXTR STRL LF DI (GLOVE) ×2 IMPLANT
GLOVE SURG ENC MOIS LTX SZ7.5 (GLOVE) ×4 IMPLANT
GLOVE SURG UNDER POLY LF SZ8 (GLOVE) ×4
GOWN STRL REUS W/ TWL LRG LVL3 (GOWN DISPOSABLE) ×2 IMPLANT
GOWN STRL REUS W/ TWL XL LVL3 (GOWN DISPOSABLE) ×1 IMPLANT
GOWN STRL REUS W/TWL LRG LVL3 (GOWN DISPOSABLE) ×4
GOWN STRL REUS W/TWL XL LVL3 (GOWN DISPOSABLE) ×2
KIT ANCHOR SUT FBRTK CVD (ORTHOPEDIC DISPOSABLE SUPPLIES) ×2 IMPLANT
KIT BASIN OR (CUSTOM PROCEDURE TRAY) ×2 IMPLANT
KIT HIP ARTHROSCOPY (SET/KITS/TRAYS/PACK) ×2 IMPLANT
KIT HIP ARTHROSCOPY BANANA BLD (CANNULA) ×1 IMPLANT
KIT TURNOVER KIT B (KITS) ×2 IMPLANT
MANIFOLD NEPTUNE II (INSTRUMENTS) ×2 IMPLANT
MARKER SKIN DUAL TIP RULER LAB (MISCELLANEOUS) ×2 IMPLANT
NEEDLE SPNL 18GX3.5 QUINCKE PK (NEEDLE) ×2 IMPLANT
PACK SURGICAL SETUP 50X90 (CUSTOM PROCEDURE TRAY) ×2 IMPLANT
PAD ARMBOARD 7.5X6 YLW CONV (MISCELLANEOUS) ×4 IMPLANT
SPONGE T-LAP 18X18 ~~LOC~~+RFID (SPONGE) ×2 IMPLANT
SUT ETHILON 4 0 PS 2 18 (SUTURE) ×2 IMPLANT
SUT FIBERWIRE #2 38 T-5 BLUE (SUTURE) ×2
SUT MNCRL AB 3-0 PS2 27 (SUTURE) ×2 IMPLANT
SUTURE FIBERWR #2 38 T-5 BLUE (SUTURE) ×1 IMPLANT
SYR CONTROL 10ML LL (SYRINGE) ×2 IMPLANT
TOWEL GREEN STERILE (TOWEL DISPOSABLE) ×2 IMPLANT
TUBE CONNECTING 12X1/4 (SUCTIONS) ×2 IMPLANT
TUBING ARTHROSCOPY IRRIG 16FT (MISCELLANEOUS) ×2 IMPLANT
WAND APOLLO RF 50D ABLATOR (BUR) ×1 IMPLANT

## 2021-05-06 NOTE — Transfer of Care (Signed)
Immediate Anesthesia Transfer of Care Note  Patient: Traci Sweeney  Procedure(s) Performed: right hip scope labral repair and acetabuloplasty (Right: Hip)  Patient Location: PACU  Anesthesia Type:General  Level of Consciousness: awake, alert , oriented and patient cooperative  Airway & Oxygen Therapy: Patient Spontanous Breathing  Post-op Assessment: Report given to RN, Post -op Vital signs reviewed and stable and Patient moving all extremities X 4  Post vital signs: Reviewed and stable  Last Vitals:  Vitals Value Taken Time  BP 99/75 05/06/21 1441  Temp    Pulse 91 05/06/21 1445  Resp 22 05/06/21 1445  SpO2 94 % 05/06/21 1445  Vitals shown include unvalidated device data.  Last Pain:  Vitals:   05/06/21 1040  TempSrc: Oral         Complications: No notable events documented.

## 2021-05-06 NOTE — Discharge Instructions (Signed)
Orthopedic surgery discharge instructions:  -You will be touchdown weightbearing to the operative lower extremity for 3 weeks.   utilizing crutches for mobilization.  You may begin moving your hip in a rotational fashion as soon as possible.  -You should apply ice to the hip for 30 minutes out of each hour that you are awake.  -Maintain your postoperative bandage until follow-up appointment.  This is a waterproof bandage and you may shower with this on as soon as you feel able.  Do not submerge underwater.  -Over-the-counter medications you should take:   -Zantac 150 mg twice per day while taking the Naprosyn, which will be for 28 total days.   -At the completion of your 28 days of Naprosyn you should take an 81 mg aspirin once per day x4 weeks to prevent blood clots.    -You should take Colace 100 mg tablet twice per day to prevent constipation.

## 2021-05-06 NOTE — Anesthesia Procedure Notes (Signed)
Procedure Name: Intubation Date/Time: 05/06/2021 1:11 PM Performed by: Jonna Munro, CRNA Pre-anesthesia Checklist: Patient identified, Emergency Drugs available, Suction available, Patient being monitored and Timeout performed Patient Re-evaluated:Patient Re-evaluated prior to induction Oxygen Delivery Method: Circle system utilized Preoxygenation: Pre-oxygenation with 100% oxygen Induction Type: IV induction Ventilation: Mask ventilation without difficulty Laryngoscope Size: Mac and 3 Grade View: Grade I Tube type: Oral Tube size: 7.0 mm Number of attempts: 1 Airway Equipment and Method: Stylet Placement Confirmation: ETT inserted through vocal cords under direct vision, positive ETCO2 and breath sounds checked- equal and bilateral Secured at: 22 cm Tube secured with: Tape Dental Injury: Teeth and Oropharynx as per pre-operative assessment

## 2021-05-06 NOTE — Brief Op Note (Signed)
05/06/2021  2:21 PM  PATIENT:  Wilkie Aye A Azbell  35 y.o. female  PRE-OPERATIVE DIAGNOSIS:  right hip labrum tear,impingment  POST-OPERATIVE DIAGNOSIS:  right hip labrum tear,impingment  PROCEDURE:  Procedure(s): right hip scope labral repair and acetabuloplasty (Right)  SURGEON:  Surgeon(s) and Role:    * Yolonda Kida, MD - Primary  PHYSICIAN ASSISTANT: Dion Saucier, PA-C  ANESTHESIA:   local and general  EBL:  10 cc  BLOOD ADMINISTERED:none  DRAINS: none   LOCAL MEDICATIONS USED:  MARCAINE     SPECIMEN:  No Specimen  DISPOSITION OF SPECIMEN:  N/A  COUNTS:  YES  TOURNIQUET:  * No tourniquets in log *  DICTATION: .Note written in EPIC  PLAN OF CARE: Discharge to home after PACU  PATIENT DISPOSITION:  PACU - hemodynamically stable.   Delay start of Pharmacological VTE agent (>24hrs) due to surgical blood loss or risk of bleeding: not applicable

## 2021-05-06 NOTE — Op Note (Signed)
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  She has had exhaustive conservative management to include steroid injection as well as physical therapy and oral medications.  She is here today for arthroscopic surgery.  We have previously discussed the risk, benefits, and indications of this procedure including but not limited to bleeding, infection, damage to surrounding neurovascular structures, heterotropic ossification, the risk of bleeding, the risk of stiffness, and need for further surgery as well as the risk of anesthesia. presents today for hip arthroscopy with possible loose body resection as well as femoroplasty to address his underlying hip pathology.;  The patient did consent to the procedure after discussion of the risks and benefits.   PREOPERATIVE DIAGNOSIS:  1.  Right hip femoral acetabular impingement 2.  Right hip labrum tear   POSTOPERATIVE DIAGNOSIS:  1.  Right hip femoral acetabular impingement 2.  Right hip anterior superior labral tearing   PROCEDURE:  1.  Right hip arthroscopy with labral repair 2.  Right hip arthroscopy with acetabuloplasty   SURGEON: Maryan Rued, M.D.   ASSIST: Dion Saucier, PA-C  Assistant attestation:  PA Mcclung was present for the entire procedure.  Participated in all portions.   ANESTHESIA:  general   IV FLUIDS AND URINE: See anesthesia.   ESTIMATED BLOOD LOSS: 10 mL.   IMPLANTS:  Arthrex 1.8 mm Fibertak all suture anchor x 1 Arthrex 2.4 mm hip push lock anchor #2 FiberWire suture x1   DRAINS: None   COMPLICATIONS: None.   DESCRIPTION OF PROCEDURE:  The patient was on a hand fracture table.  A well-padded oversized peroneal post was placed with well-padded traction boots applied to both right and left leg.  SCD was on the contralateral limb.  Timeout was performed by myself, the surgeon, to confirm correct site of surgery and confirm the procedure to be performed, and confirm IV  antibiotics had been given.   Provisional traction was applied to the hip placing adequate distraction to into the joint.  The right hip was then prepped and draped in the usual standard sterile fashion.  Femoral traction was reapplied to the hip placing it in the position of slight flexion with neutral rotation and neutral abduction.    Next, standard anterior peritrochanteric portal was established just superior and anterior to the superior tip of the greater trochanter.  Needle localization with the aid of image intensification and air contrast arthrogram confirmed intra-articular placement.  A Nitinol wire was then placed, 11 blade used to make a stab incision and then a 4.5 [place] cannula was inserted atraumatically.  Next a mid anterior portal was established via direct visualization in the anterior triangle.  It was established approximately 7 cm distant at about a 45 angle off of the axial plane from the previously established anterior peritrochanteric portal.  Needle was inserted under direct visualization.  A Nitinol wire was then placed an 11 blade used to make a stab incision and a 5.0 cannula was inserted atraumatically.     Operative findings: On the diagnostic arthroscopy of this right hip there was An area of chondral labral dissociation consistent with labrum tear.  This was noted to be at approximately from 11:00 to about 1:00 on this right hip.  There was no cartilage delamination.  There were no loose bodies. Peripherally there was moderate synovitis.  Femoral head Cartlige was normal.  The acetabular Cartledge was normal.  Colloid fossa, ligament teres, and fovea capitis were normal.  At this time, we did an intraportal capsulotomy with a Beaver blade.  This was utilized to enlarge the capsular incision to allow for larger cannula insertion into work lateral and medial.  Motorized shaver was used to perform a gentle synovectomy.  The labrum was then debrided with the motorized  shaver and ArthroCare wand.   Next, we prepared for the acetabuloplasty.  After identifying the acetabular rim at the level of the labrum tear was noted to be in a pincer deformity.  We then used a motorized bur to resect approximately 4 to 5 mm of the cortical rim back to the level of the articular cartilage.   Next we then prepared the torn labrum for its repair.  Utilizing the motorized bur the acetabular rim was gently decorticated to allow for good bleeding bone.  Once this was accomplished we then maneuvered the drill guide into place at the 1 o'clock position.  We drilled the pilot hole for the 1.8 mm fiber tach.  The anchor was then placed into the pilot hole.  However, this anchor did not have good purchase and pulled out easily.  Therefore, we switched to a 2.4 mm push lock.  We passed a free #2 FiberWire in a luggage tag fashion around the labral tear.  This was then loaded into the push lock which was malleted into the previously drilled pilot hole.  This had excellent purchase.  Care was taken not to penetrate the articular cartilage.  We then placed the anchor which had good bony fixation.  We then passed a simple repair stitch around the labrum with the soft tissue penetrator.  This was then passed through the self locking mechanism of the anchor and showed excellent tension and a nice rolled repair of the labrum.  This was then repeated at the 11 o'clock position.  A total of 2 anchors were used.    At this juncture we were satisfied that the intra-articular portion of the hip arthroscopy was complete.  We then removed gross traction placed a traction stitch in the inferior leaflet of the capsulotomy.  This allowed improved peripheral inspection of the junction of the anterior lateral and medial head neck junction.  There was noted to be no loss of sphericity or femoral head offset which was consistent with preoperative imaging.       Next, a scopic cannulas were removed.  Skin incision was  closed with 3-0 Monocryl, and Steri-Strips.  Wounds were injected with quarter percent plain Marcaine with epinephrine.  The wounds were then dressed with Steri-Strips, Adaptic, 4 x 4's, and a Tegaderm.   Postop plan:   Touchdown weightbearing 3 weeks.  She will begin physical therapy next week.  She will be on the standard hip arthroscopy postoperative medication protocol to prevent heterotropic ossification and gastric ulcer.  I will see her in the office in 2 weeks.

## 2021-05-06 NOTE — Anesthesia Preprocedure Evaluation (Addendum)
Anesthesia Evaluation  Patient identified by MRN, date of birth, ID band Patient awake    Reviewed: Allergy & Precautions, NPO status , Patient's Chart, lab work & pertinent test results  History of Anesthesia Complications (+) PONV  Airway Mallampati: II  TM Distance: >3 FB Neck ROM: Full    Dental  (+) Caps, Dental Advisory Given,    Pulmonary former smoker,    breath sounds clear to auscultation       Cardiovascular (-) hypertension(-) angina Rhythm:Regular Rate:Normal     Neuro/Psych  Headaches, Depression    GI/Hepatic Neg liver ROS, GERD  Controlled,  Endo/Other  diabetes, Gestational  Renal/GU negative Renal ROS     Musculoskeletal   Abdominal   Peds  Hematology  (+) Blood dyscrasia (Hb 10.7), anemia ,   Anesthesia Other Findings   Reproductive/Obstetrics                            Anesthesia Physical Anesthesia Plan  ASA: 2  Anesthesia Plan: General   Post-op Pain Management:    Induction: Intravenous  PONV Risk Score and Plan: 4 or greater and Ondansetron, Dexamethasone and Scopolamine patch - Pre-op  Airway Management Planned: Oral ETT  Additional Equipment: None  Intra-op Plan:   Post-operative Plan: Extubation in OR  Informed Consent: I have reviewed the patients History and Physical, chart, labs and discussed the procedure including the risks, benefits and alternatives for the proposed anesthesia with the patient or authorized representative who has indicated his/her understanding and acceptance.     Dental advisory given  Plan Discussed with: CRNA and Surgeon  Anesthesia Plan Comments:        Anesthesia Quick Evaluation

## 2021-05-06 NOTE — H&P (Signed)
ORTHOPAEDIC H and P  REQUESTING PHYSICIAN: Yolonda Kida, MD  PCP:  Patient, No Pcp Per (Inactive)  Chief Complaint:   HPI: Traci Sweeney is a 35 y.o. female who complains of right hip pain.  She has failed conservative treatment with injections and outpatient physical therapy.  Here today for arthroscopy on the right hip.  No new complaints.  Past Medical History:  Diagnosis Date   GERD (gastroesophageal reflux disease)    Gestational diabetes    Headache    History of kidney stones    HSV-1 infection    Inguinal hernia infant   bilateral   Nephrolithiasis    with first pregnancy   PONV (postoperative nausea and vomiting)    Postpartum depression    Pre-eclampsia    UTI (lower urinary tract infection) 2006 - 2007    Dr. Sherron Monday uro eval seconday to frequent infection with negative cysto   Past Surgical History:  Procedure Laterality Date   CESAREAN SECTION N/A 07/17/2015   Procedure: CESAREAN SECTION;  Surgeon: Essie Hart, MD;  Location: [facility];  Service: Obstetrics;  Laterality: N/A;   CESAREAN SECTION N/A 01/05/2018   Procedure: REPEAT CESAREAN SECTION;  Surgeon: Carrington Clamp, MD;  Location: [facility];  Service: Obstetrics;  Laterality: N/A;   CESAREAN SECTION N/A 02/09/2019   Procedure: CESAREAN SECTION;  Surgeon: Waynard Reeds, MD;  Location: [facility] OR[facility];  Service: Obstetrics;  Laterality: N/A;   CYSTOSCOPY  10/2005   negative   CYSTOSCOPY N/A 05/01/2020   Procedure: CYSTOSCOPY;  Surgeon: Carrington Clamp, MD;  Location: [facility];  Service: Gynecology;  Laterality: N/A;   INGUINAL HERNIA REPAIR Bilateral infant   MYRINGOTOMY  2005   ROBOTIC ASSISTED TOTAL HYSTERECTOMY N/A 05/01/2020   Procedure: XI ROBOTIC ASSISTED TOTAL HYSTERECTOMY;  Surgeon: Carrington Clamp, MD;  Location: [facility];  Service: Gynecology;  Laterality: N/A;  cautery of endometriosis   SALPINGECTOMY  2020   TONSILLECTOMY  age 64    TYMPANOPLASTY  2006   WISDOM TOOTH EXTRACTION     XI ROBOTIC ASSISTED OOPHORECTOMY Bilateral 05/01/2020   Procedure: XI ROBOTIC ASSISTED SALPINGECTOMIES;  Surgeon: Carrington Clamp, MD;  Location: [facility];  Service: Gynecology;  Laterality: Bilateral;   Social History   Socioeconomic History   Marital status: Married    Spouse name: Not on file   Number of children: 0   Years of education: Not on file   Highest education level: Not on file  Occupational History   Not on file  Tobacco Use   Smoking status: Former    Packs/day: 0.50    Years: 8.00    Pack years: 4.00    Types: Cigarettes    Quit date: 08/05/2011    Years since quitting: 9.7   Smokeless tobacco: Never  Vaping Use   Vaping Use: Never used  Substance and Sexual Activity   Alcohol use: Yes    Comment: rarely   Drug use: No   Sexual activity: Yes    Partners: Male    Birth control/protection: Pill  Other Topics Concern   Not on file  Social History Narrative   Not on file   Social Determinants of Health   Financial Resource Strain: Not on file  Food Insecurity: Not on file  Transportation Needs: Not on file  Physical Activity: Not on file  Stress: Not on file  Social Connections: Not on file   Family History  Problem Relation Age of  Onset   Diabetes Father    Cancer Father    Hypertension Mother    Heart disease Mother    Lung cancer Paternal Grandfather        deceased   Heart attack Paternal Grandfather 56       deceased   Lymphoma Paternal Uncle        deceased   Lung cancer Maternal Grandfather        deceased   No Known Allergies Prior to Admission medications   Medication Sig Start Date End Date Taking? Authorizing Provider  acetaminophen (TYLENOL) 500 MG tablet Take 1,000 mg by mouth every 6 (six) hours as needed for moderate pain or headache.   Yes [provider]  aspirin-acetaminophen-caffeine (EXCEDRIN MIGRAINE) [phone] MG tablet Take 2 tablets by  mouth every 6 (six) hours as needed for headache.   Yes [provider]  diphenhydrAMINE (BENADRYL) 25 MG tablet Take 25 mg by mouth every 6 (six) hours as needed for allergies or itching.   Yes [provider]  FLUoxetine (PROZAC) 40 MG capsule Take 40 mg by mouth at bedtime. 04/01/20  Yes [provider]  Melatonin 10 MG CAPS Take 10 mg by mouth at bedtime as needed (sleep).   Yes [provider]   No results found.  Positive ROS: All other systems have been reviewed and were otherwise negative with the exception of those mentioned in the HPI and as above.  Physical Exam: General: Alert, no acute distress Cardiovascular: No pedal edema Respiratory: No cyanosis, no use of accessory musculature GI: No organomegaly, abdomen is soft and non-tender Skin: No lesions in the area of chief complaint Neurologic: Sensation intact distally Psychiatric: Patient is competent for consent with normal mood and affect Lymphatic: No axillary or cervical lymphadenopathy  MUSCULOSKELETAL:  Right lower extremity is warm and well-perfused with no open wounds or lesions.  Neurovascularly intact.  Assessment: 1.  Right hip femoral acetabular impingement  2.  Right hip labrum tear  Plan: -Plan to proceed today with arthroscopy of the right hip with labral repair possible acetabuloplasty and possible femoroplasty.  We discussed the risk and benefits at length including but not limited to bleeding, infection, damage to surrounding nerves and vessels, stiffness, heterotropic ossification, failure of repair, need for further surgery, development of arthritis, development of DVT, the risk of anesthesia.  She has provided informed consent.  -Plan for discharge home postoperatively from PACU.    Yolonda Kida, MD Cell [phone]    05/06/2021 12:22 PM

## 2021-05-06 NOTE — Progress Notes (Signed)
Orthopedic Tech Progress Note Patient Details:  Traci Sweeney 1986/02/18 [id number]  Ortho Devices Type of Ortho Device: Crutches Ortho Device/Splint Interventions: Ordered, Application, Adjustment   Post Interventions Patient Tolerated: Well, Ambulated well Instructions Provided: Poper ambulation with device  Brought crutches, measured up to fit pt and taught ambulation.  Pt got nauseated and had to sit down. RN gave nausea meds and we went again.  She was able to walk further and report back correct LE sequencing for stairs.  Pt will have husband's assist at discharge.   Thanks,  Corinna Capra, PT, DPT  Acute Rehabilitation Ortho Tech Supervisor [phone] pager #[phone] office     Traci Sweeney 05/06/2021, 5:08 PM

## 2021-05-06 NOTE — Anesthesia Postprocedure Evaluation (Signed)
Anesthesia Post Note  Patient: Amiya A Vanlue  Procedure(s) Performed: right hip scope labral repair and acetabuloplasty (Right: Hip)     Patient location during evaluation: PACU Anesthesia Type: General Level of consciousness: awake and alert, patient cooperative and oriented Pain management: pain level controlled Vital Signs Assessment: post-procedure vital signs reviewed and stable Respiratory status: spontaneous breathing, nonlabored ventilation and respiratory function stable Cardiovascular status: blood pressure returned to baseline and stable Postop Assessment: no apparent nausea or vomiting and adequate PO intake Anesthetic complications: no   No notable events documented.  Last Vitals:  Vitals:   05/06/21 1455 05/06/21 1510  BP: 110/83 97/81  Pulse: 81 75  Resp: 16 (!) 8  Temp:    SpO2: 93% 99%    Last Pain:  Vitals:   05/06/21 1510  TempSrc:   PainSc: 5                  Lopaka Karge,E. Shaunak Kreis

## 2021-05-07 ENCOUNTER — Encounter (HOSPITAL_COMMUNITY): Payer: Self-pay | Admitting: Orthopedic Surgery

## 2021-10-28 ENCOUNTER — Other Ambulatory Visit: Payer: Self-pay

## 2021-10-28 ENCOUNTER — Ambulatory Visit: Payer: 59 | Admitting: Podiatry

## 2021-10-28 DIAGNOSIS — S90111A Contusion of right great toe without damage to nail, initial encounter: Secondary | ICD-10-CM

## 2021-10-28 MED ORDER — TERBINAFINE HCL 250 MG PO TABS: 250.0000 mg | ORAL_TABLET | Freq: Every day | ORAL | 0 refills | Status: AC

## 2021-11-03 ENCOUNTER — Encounter: Payer: Self-pay | Admitting: Podiatry

## 2021-11-03 ENCOUNTER — Telehealth: Payer: Self-pay | Admitting: *Deleted

## 2021-11-03 NOTE — Telephone Encounter (Signed)
"  I was seen last week by Dr. Allena Katz.  I sent a message this morning on the portal.  I noticed that it takes about 48 hours sometimes for them to see it.  I thought I could call and ask a question sooner than waiting on a response from the portal.  I have a question about the pain in the toe.  I finished the antibiotic yesterday but the pain is just, I don't know.  I didn't discuss that with him in the visit.  I want to make sure everything is okay and that there's nothing underlying or anything.  Give me a call back."

## 2021-11-03 NOTE — Telephone Encounter (Signed)
Please advise 

## 2021-11-04 NOTE — Progress Notes (Signed)
Subjective:  Patient ID: Traci Sweeney, female    DOB: 10/07/1985,  MRN: [id number]  Chief Complaint  Patient presents with   Ingrown Toenail    Right hallux     36 y.o. female presents with the above complaint.  Patient presents with complaint of right hallux proximal nail fold contusion.  Patient states is sore to touch is painful.  Hurts with ambulation.  She feels a soreness mostly at nighttime.  She does have dogs that can sometimes stops on her toe.  She wanted to get it evaluated.  She is currently on Bactrim from her primary care physician.  She wanted to make sure it is not an ingrown.  She has not tried anything for it.  There is no drainage present.  She wanted get evaluated.  Pain scale is 5 out of 10 dull achy in nature.   Review of Systems: Negative except as noted in the HPI. Denies N/V/F/Ch.  Past Medical History:  Diagnosis Date   GERD (gastroesophageal reflux disease)    Gestational diabetes    Headache    History of kidney stones    HSV-1 infection    Inguinal hernia infant   bilateral   Nephrolithiasis    with first pregnancy   PONV (postoperative nausea and vomiting)    Postpartum depression    Pre-eclampsia    UTI (lower urinary tract infection) 2006 - 2007    Dr. Matilde Sprang uro eval seconday to frequent infection with negative cysto    Current Outpatient Medications:    terbinafine (LAMISIL) 250 MG tablet, Take 1 tablet (250 mg total) by mouth daily for 14 days., Disp: 14 tablet, Rfl: 0   acetaminophen (TYLENOL) 500 MG tablet, Take 1,000 mg by mouth every 6 (six) hours as needed for moderate pain or headache., Disp: , Rfl:    aspirin-acetaminophen-caffeine (EXCEDRIN MIGRAINE) 250-250-65 MG tablet, Take 2 tablets by mouth every 6 (six) hours as needed for headache., Disp: , Rfl:    diphenhydrAMINE (BENADRYL) 25 MG tablet, Take 25 mg by mouth every 6 (six) hours as needed for allergies or itching., Disp: , Rfl:    FLUoxetine (PROZAC) 40 MG capsule, Take 40  mg by mouth at bedtime., Disp: , Rfl:    Melatonin 10 MG CAPS, Take 10 mg by mouth at bedtime as needed (sleep)., Disp: , Rfl:    methocarbamol (ROBAXIN) 500 MG tablet, Take 1 tablet (500 mg total) by mouth every 6 (six) hours as needed for muscle spasms., Disp: 45 tablet, Rfl: 1   ondansetron (ZOFRAN ODT) 4 MG disintegrating tablet, Take 1 tablet (4 mg total) by mouth every 8 (eight) hours as needed., Disp: 20 tablet, Rfl: 0   oxyCODONE (ROXICODONE) 5 MG immediate release tablet, Take 1 tablet (5 mg total) by mouth every 4 (four) hours as needed for severe pain., Disp: 25 tablet, Rfl: 0  Social History   Tobacco Use  Smoking Status Former   Packs/day: 0.50   Years: 8.00   Pack years: 4.00   Types: Cigarettes   Quit date: 08/05/2011   Years since quitting: 10.2  Smokeless Tobacco Never    No Known Allergies Objective:  There were no vitals filed for this visit. There is no height or weight on file to calculate BMI. Constitutional Well developed. Well nourished.  Vascular Dorsalis pedis pulses palpable bilaterally. Posterior tibial pulses palpable bilaterally. Capillary refill normal to all digits.  No cyanosis or clubbing noted. Pedal hair growth normal.  Neurologic Normal speech. Oriented  to person, place, and time. Epicritic sensation to light touch grossly present bilaterally.  Dermatologic Proximal nail matrix contusion noted with mild erythema circumferentially around the proximal nail.  There is likely signs of microtrauma to the nail.  No purulent drainage expressed.  Orthopedic: Normal joint ROM without pain or crepitus bilaterally. No visible deformities. No bony tenderness.   Radiographs: None Assessment:   1. Contusion of right great toe without damage to nail, initial encounter    Plan:  Patient was evaluated and treated and all questions answered.  Right proximal nail matrix contusion of hallux -All questions and concerns were discussed with the patient in  extensive detail -Continue Bactrim and finish the course to help with resolve meant of erythema -She would also benefit from Lamisil as there is some epidermal lysis and skin peeling associated with it could likely be associated with a fungal infection.  I will place her on Lamisil for 14 days to help with the fungal component. -I have instructed her to do Betadine wet-to-dry dressing changes daily to help keep the area nice and dry and allow the soft tissue to heal appropriately -If there is worsening redness advised to go to the emergency room right away.   No follow-ups on file.

## 2021-11-25 ENCOUNTER — Ambulatory Visit: Payer: 59 | Admitting: Podiatry

## 2021-11-25 ENCOUNTER — Other Ambulatory Visit: Payer: Self-pay

## 2021-11-25 DIAGNOSIS — S90111A Contusion of right great toe without damage to nail, initial encounter: Secondary | ICD-10-CM

## 2021-11-27 NOTE — Progress Notes (Signed)
Subjective:  Patient ID: Traci Sweeney, female    DOB: [birth year],  MRN: [id number]  Chief Complaint  Patient presents with   Nail Problem    Right hallux nail     36 y.o. female presents with the above complaint.  Patient presents with follow-up of right hallux proximal nail contusion.  Patient states she is doing a lot better.  She was doing dressing changes as well as taking oral antibiotics.  She would like to know if she can continue.  She denies any other acute complaints.   Review of Systems: Negative except as noted in the HPI. Denies N/V/F/Ch.  Past Medical History:  Diagnosis Date   GERD (gastroesophageal reflux disease)    Gestational diabetes    Headache    History of kidney stones    HSV-1 infection    Inguinal hernia infant   bilateral   Nephrolithiasis    with first pregnancy   PONV (postoperative nausea and vomiting)    Postpartum depression    Pre-eclampsia    UTI (lower urinary tract infection) 2006 - 2007    Dr. Matilde Sprang uro eval seconday to frequent infection with negative cysto    Current Outpatient Medications:    acetaminophen (TYLENOL) 500 MG tablet, Take 1,000 mg by mouth every 6 (six) hours as needed for moderate pain or headache., Disp: , Rfl:    aspirin-acetaminophen-caffeine (EXCEDRIN MIGRAINE) 250-250-65 MG tablet, Take 2 tablets by mouth every 6 (six) hours as needed for headache., Disp: , Rfl:    diphenhydrAMINE (BENADRYL) 25 MG tablet, Take 25 mg by mouth every 6 (six) hours as needed for allergies or itching., Disp: , Rfl:    FLUoxetine (PROZAC) 40 MG capsule, Take 40 mg by mouth at bedtime., Disp: , Rfl:    Melatonin 10 MG CAPS, Take 10 mg by mouth at bedtime as needed (sleep)., Disp: , Rfl:    methocarbamol (ROBAXIN) 500 MG tablet, Take 1 tablet (500 mg total) by mouth every 6 (six) hours as needed for muscle spasms., Disp: 45 tablet, Rfl: 1   ondansetron (ZOFRAN ODT) 4 MG disintegrating tablet, Take 1 tablet (4 mg total) by mouth every 8  (eight) hours as needed., Disp: 20 tablet, Rfl: 0   oxyCODONE (ROXICODONE) 5 MG immediate release tablet, Take 1 tablet (5 mg total) by mouth every 4 (four) hours as needed for severe pain., Disp: 25 tablet, Rfl: 0  Social History   Tobacco Use  Smoking Status Former   Packs/day: 0.50   Years: 8.00   Pack years: 4.00   Types: Cigarettes   Quit date: 08/05/2011   Years since quitting: 10.3  Smokeless Tobacco Never    No Known Allergies Objective:  There were no vitals filed for this visit. There is no height or weight on file to calculate BMI. Constitutional Well developed. Well nourished.  Vascular Dorsalis pedis pulses palpable bilaterally. Posterior tibial pulses palpable bilaterally. Capillary refill normal to all digits.  No cyanosis or clubbing noted. Pedal hair growth normal.  Neurologic Normal speech. Oriented to person, place, and time. Epicritic sensation to light touch grossly present bilaterally.  Dermatologic No further proximal nail matrix contusion noted without erythema circumferentially around the proximal nail.  There is likely signs of microtrauma to the nail.  No purulent drainage expressed.  Orthopedic: Normal joint ROM without pain or crepitus bilaterally. No visible deformities. No bony tenderness.   Radiographs: None Assessment:   No diagnosis found.  Plan:  Patient was evaluated and treated and  all questions answered.  Right proximal nail matrix contusion of hallux -All questions and concerns were discussed with the patient in extensive detail -Clinically healed at this time.  I discussed with the patient that she can stop without the medication.  She can resume regular activities.  No other acute issues noted with the incision.  No follow-ups on file.

## 2022-01-27 DIAGNOSIS — M25551 Pain in right hip: Secondary | ICD-10-CM | POA: Diagnosis not present

## 2022-02-05 ENCOUNTER — Ambulatory Visit: Payer: Self-pay | Admitting: Adult Health

## 2022-02-16 DIAGNOSIS — M25551 Pain in right hip: Secondary | ICD-10-CM | POA: Diagnosis not present

## 2022-02-20 IMAGING — RF DG HIP (WITH PELVIS) OPERATIVE*R*
1 series · 1 of 1 positions shown · non-contrast
Comparison: None.

CLINICAL DATA: Elective surgery. Labral repair and acetabuloplasty.

EXAM:
OPERATIVE RIGHT HIP (WITH PELVIS IF PERFORMED)
TECHNIQUE: Fluoroscopic spot image(s) were submitted for interpretation
post-operatively.

[Series 1: run · 1 of 1 slices shown]
[im 1/1]
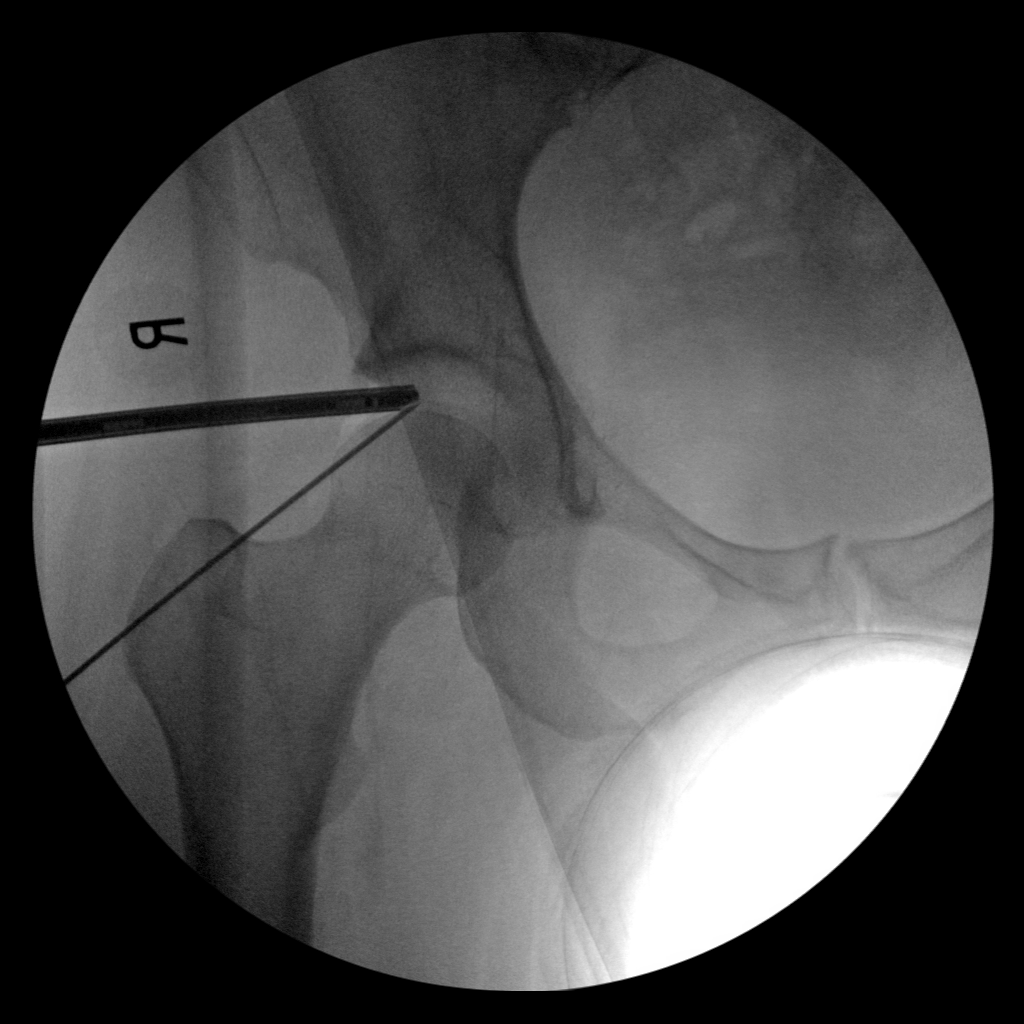

[1 of 1 positions shown; findings below may reference images not displayed]

FINDINGS: Single fluoroscopic spot view of the right hip obtained in the
operating room. Surgical instruments localize superior to the
femoral head. Fluoroscopy time 10 seconds. Dose 1.25 mGy.
IMPRESSION: Procedural fluoroscopy during right hip surgery.

## 2022-02-20 IMAGING — RF DG C-ARM 1-60 MIN
1 series · 1 of 1 positions shown · non-contrast
Comparison: None.

CLINICAL DATA: Elective surgery. Labral repair and acetabuloplasty.

EXAM:
OPERATIVE RIGHT HIP (WITH PELVIS IF PERFORMED)
TECHNIQUE: Fluoroscopic spot image(s) were submitted for interpretation
post-operatively.

[Series 1: run · 1 of 1 slices shown]
[im 1/1]
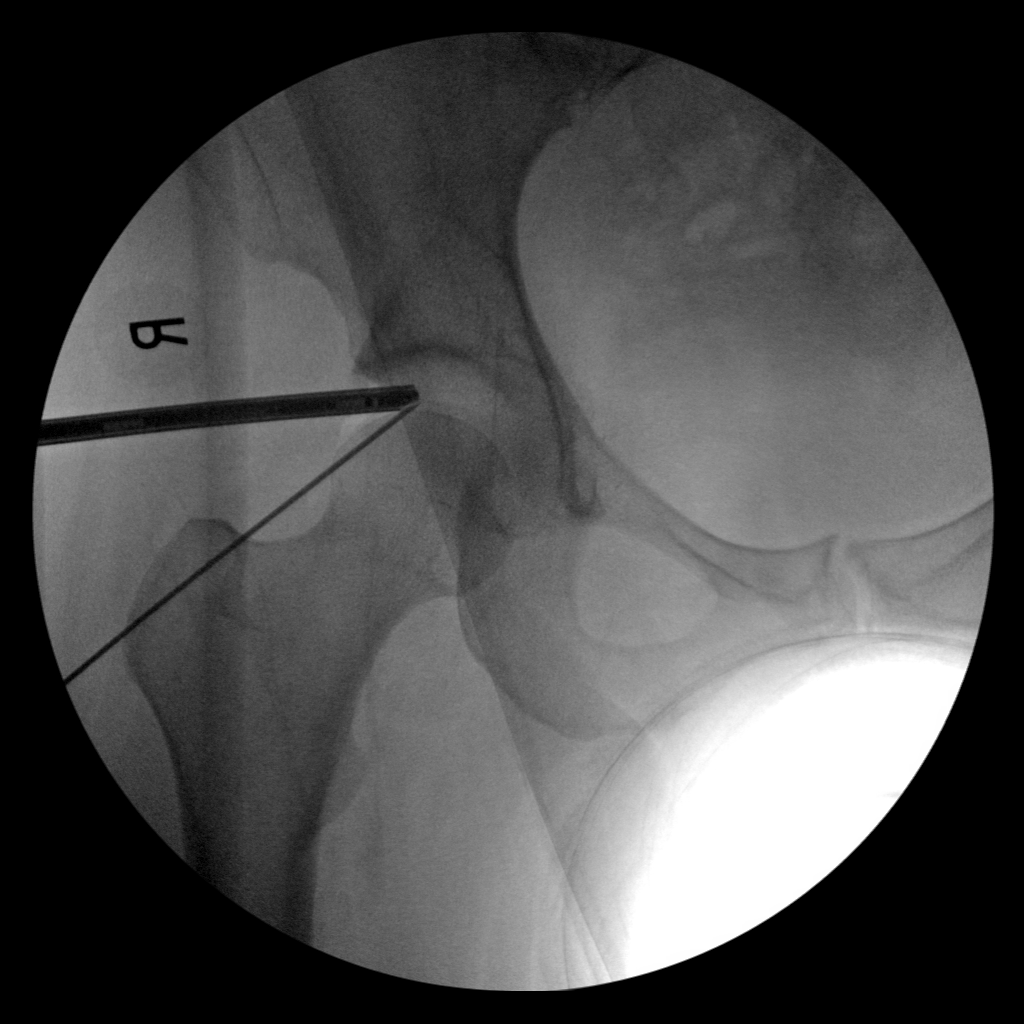

[1 of 1 positions shown; findings below may reference images not displayed]

FINDINGS: Single fluoroscopic spot view of the right hip obtained in the
operating room. Surgical instruments localize superior to the
femoral head. Fluoroscopy time 10 seconds. Dose 1.25 mGy.
IMPRESSION: Procedural fluoroscopy during right hip surgery.

## 2022-03-03 DIAGNOSIS — M25551 Pain in right hip: Secondary | ICD-10-CM | POA: Diagnosis not present

## 2022-04-02 DIAGNOSIS — Z1272 Encounter for screening for malignant neoplasm of vagina: Secondary | ICD-10-CM | POA: Diagnosis not present

## 2022-04-15 ENCOUNTER — Encounter: Payer: Self-pay | Admitting: Nurse Practitioner

## 2022-04-15 ENCOUNTER — Ambulatory Visit (INDEPENDENT_AMBULATORY_CARE_PROVIDER_SITE_OTHER): Payer: 59 | Admitting: Nurse Practitioner

## 2022-04-15 VITALS — BP 122/80 | HR 98 | Temp 97.5°F | Resp 14 | Ht 64.0 in | Wt 143.4 lb

## 2022-04-15 DIAGNOSIS — G2581 Restless legs syndrome: Secondary | ICD-10-CM | POA: Diagnosis not present

## 2022-04-15 DIAGNOSIS — G44209 Tension-type headache, unspecified, not intractable: Secondary | ICD-10-CM

## 2022-04-15 DIAGNOSIS — F419 Anxiety disorder, unspecified: Secondary | ICD-10-CM | POA: Diagnosis not present

## 2022-04-15 DIAGNOSIS — F32A Depression, unspecified: Secondary | ICD-10-CM | POA: Insufficient documentation

## 2022-04-15 DIAGNOSIS — Z0000 Encounter for general adult medical examination without abnormal findings: Secondary | ICD-10-CM | POA: Diagnosis not present

## 2022-04-15 HISTORY — DX: Encounter for general adult medical examination without abnormal findings: Z00.00

## 2022-04-15 LAB — COMPREHENSIVE METABOLIC PANEL
ALT: 9 U/L (ref 0–35)
AST: 14 U/L (ref 0–37)
Albumin: 4.4 g/dL (ref 3.5–5.2)
Alkaline Phosphatase: 59 U/L (ref 39–117)
BUN: 11 mg/dL (ref 6–23)
CO2: 29 mEq/L (ref 19–32)
Calcium: 9.3 mg/dL (ref 8.4–10.5)
Chloride: 104 mEq/L (ref 96–112)
Creatinine, Ser: 0.83 mg/dL (ref 0.40–1.20)
GFR: 90.04 mL/min (ref >60.00)
Glucose, Bld: 87 mg/dL (ref 70–99)
Potassium: 4.4 mEq/L (ref 3.5–5.1)
Sodium: 138 mEq/L (ref 135–145)
Total Bilirubin: 0.9 mg/dL (ref 0.2–1.2)
Total Protein: 6.4 g/dL (ref 6.0–8.3)

## 2022-04-15 LAB — CBC
HCT: 39.3 % (ref 36.0–46.0)
Hemoglobin: 12.9 g/dL (ref 12.0–15.0)
MCHC: 32.7 g/dL (ref 30.0–36.0)
MCV: 86.6 fl (ref 78.0–100.0)
Platelets: 361 10*3/uL (ref 150.0–400.0)
RBC: 4.54 Mil/uL (ref 3.87–5.11)
RDW: 14.2 % (ref 11.5–15.5)
WBC: 5.1 10*3/uL (ref 4.0–10.5)

## 2022-04-15 LAB — TSH: TSH: 1.07 u[IU]/mL (ref 0.35–5.50)

## 2022-04-15 LAB — LIPID PANEL
Cholesterol: 193 mg/dL (ref 0–200)
HDL: 86.9 mg/dL (ref >39.00)
LDL Cholesterol: 95 mg/dL (ref 0–99)
NonHDL: 105.74
Total CHOL/HDL Ratio: 2
Triglycerides: 54 mg/dL (ref 0.0–149.0)
VLDL: 10.8 mg/dL (ref 0.0–40.0)

## 2022-04-15 LAB — IBC + FERRITIN
Ferritin: 23.7 ng/mL (ref 10.0–291.0)
Iron: 69 ug/dL (ref 42–145)
Saturation Ratios: 19.4 % — ABNORMAL LOW (ref 20.0–50.0)
TIBC: 355.6 ug/dL (ref 250.0–450.0)
Transferrin: 254 mg/dL (ref 212.0–360.0)

## 2022-04-15 MED ORDER — CYCLOBENZAPRINE HCL 5 MG PO TABS: 5.0000 mg | ORAL_TABLET | Freq: Two times a day (BID) | ORAL | 0 refills | Status: DC | PRN

## 2022-04-15 NOTE — Assessment & Plan Note (Signed)
Patient had frequent headaches do sound tension-like in nature will write Flexeril 5 mg twice daily as needed.  Sedation precautions reviewed with patient she will update via MyChart if this helps or does not help.  Did offer to make a physical follow-up with patient but she was comfortable with MyChart follow-up

## 2022-04-15 NOTE — Assessment & Plan Note (Signed)
Patient does describe what sounds to be like a restless leg syndrome.  We will check basic labs including TSH and iron levels today.

## 2022-04-15 NOTE — Progress Notes (Signed)
New Patient Office Visit  Subjective    Patient ID: Traci Sweeney, female    DOB: June 07, 1986  Age: 36 y.o. MRN: [id number]  CC:  Chief Complaint  Patient presents with   Establish Care    No previous PCP-went to Urgent Care-does go to [facility]   Headache    Has had them for a long time, becoming a little more frequent. Excedrin does not help as well anymore    HPI Traci Sweeney presents to establish care  Headaches: No history of migrianes by dx. States that she has always had haeadaches. States that she will see stars and need to lay down. States that they are increasing in intensity and frequent. States that she will take excedrin migriane and it is not taking the headace away. Didi take a excedrin and helped dull.  Located across the front of the head. States that it can be weekly. States the pain is described as a squeezing tight pian. Somties they can be sharp pains.  Currenlty a dull pain.   Weight loss: Sees a telehealh provider for Trul[facility]. Sees them every other month.  States she just started medications been on it for few months.  Depression/Anxeity: Has been managed by her GYN. No SI/HI/AVH currently maintained on fluoxetine 40 mg daily patient tolerates medication well.  PHQ-9 and GAD-7 administered in office  for complete physical and follow up of chronic conditions.  Immunizations: -Tetanus:2020 -Influenza: Out of season -Covid-19:pfizer -Shingles: Too young -Pneumonia: Too young  -HPV: Up-to-date  Diet: Fair diet. States that she will ear 2 meals a day. Will drink coffee. Water Exercise: No regular exercise.  Eye exam: Completes annually. Contact [facility] year[facility] Dr. Leron Croak  Dental exam: Completes semi-annually   Pap Smear: Hysterectomy. Saw for her annual two weeks ago.  Still followed by GYN Mammogram: Too young, currently average risk  Colonoscopy: too young, currently average risk Lung Cancer Screening:  N/A Dexa: N/A   Sleep: Goes to bed around 1030 and get up around 6. Feels rested sometimes. Does not snore  Followed by Ortho for right hip pain and has got an injection into the joint that offered some relief.  She is still working with GYN and Ortho currently.   Outpatient Encounter Medications as of 04/15/2022  Medication Sig   acetaminophen (TYLENOL) 500 MG tablet Take 1,000 mg by mouth every 6 (six) hours as needed for moderate pain or headache.   aspirin-acetaminophen-caffeine (EXCEDRIN MIGRAINE) 250-250-65 MG tablet Take 2 tablets by mouth every 6 (six) hours as needed for headache.   cyclobenzaprine (FLEXERIL) 5 MG tablet Take 1 tablet (5 mg total) by mouth 2 (two) times daily as needed for up to 15 days for muscle spasms.   diphenhydrAMINE (BENADRYL) 25 MG tablet Take 25 mg by mouth every 6 (six) hours as needed for allergies or itching.   Dulaglutide (TRULICITY) 0.75 MG/0.5ML SOPN INJECT 1 PEN (0.75 MG) UNDER SKIN ONCE WEEKLY   FLUoxetine (PROZAC) 40 MG capsule Take 40 mg by mouth at bedtime.   Melatonin 10 MG CAPS Take 10 mg by mouth at bedtime as needed (sleep).   [DISCONTINUED] methocarbamol (ROBAXIN) 500 MG tablet Take 1 tablet (500 mg total) by mouth every 6 (six) hours as needed for muscle spasms.   [DISCONTINUED] ondansetron (ZOFRAN ODT) 4 MG disintegrating tablet Take 1 tablet (4 mg total) by mouth every 8 (eight) hours as needed.   [DISCONTINUED] oxyCODONE (ROXICODONE) 5 MG immediate  release tablet Take 1 tablet (5 mg total) by mouth every 4 (four) hours as needed for severe pain.   No facility-administered encounter medications on file as of 04/15/2022.    Past Medical History:  Diagnosis Date   Anxiety    GERD (gastroesophageal reflux disease)    Gestational diabetes    Headache    History of kidney stones    HSV-1 infection    Inguinal hernia infant   bilateral   Nephrolithiasis    with first pregnancy   PONV (postoperative nausea and vomiting)    Postpartum  depression    Pre-eclampsia    UTI (lower urinary tract infection) 2006 - 2007    Dr. Sherron Monday uro eval seconday to frequent infection with negative cysto    Past Surgical History:  Procedure Laterality Date   ABDOMINAL HYSTERECTOMY  June 2021   CESAREAN SECTION N/A 07/17/2015   Procedure: CESAREAN SECTION;  Surgeon: Essie Hart, MD;  Location: [facility];  Service: Obstetrics;  Laterality: N/A;   CESAREAN SECTION N/A 01/05/2018   Procedure: REPEAT CESAREAN SECTION;  Surgeon: Carrington Clamp, MD;  Location: [facility];  Service: Obstetrics;  Laterality: N/A;   CESAREAN SECTION N/A 02/09/2019   Procedure: CESAREAN SECTION;  Surgeon: Waynard Reeds, MD;  Location: [facility];  Service: Obstetrics;  Laterality: N/A;   CYSTOSCOPY  10/2005   negative   CYSTOSCOPY N/A 05/01/2020   Procedure: CYSTOSCOPY;  Surgeon: Carrington Clamp, MD;  Location: [facility];  Service: Gynecology;  Laterality: N/A;   HIP ARTHROSCOPY Right 05/06/2021   Procedure: right hip scope labral repair and acetabuloplasty;  Surgeon: Yolonda Kida, MD;  Location: [facility] OR;  Service: Orthopedics;  Laterality: Right;   INGUINAL HERNIA REPAIR Bilateral infant   MYRINGOTOMY  2005   ROBOTIC ASSISTED TOTAL HYSTERECTOMY N/A 05/01/2020   Procedure: XI ROBOTIC ASSISTED TOTAL HYSTERECTOMY;  Surgeon: Carrington Clamp, MD;  Location: [facility];  Service: Gynecology;  Laterality: N/A;  cautery of endometriosis   SALPINGECTOMY  2020   TONSILLECTOMY  age 37   TYMPANOPLASTY  2006   WISDOM TOOTH EXTRACTION     XI ROBOTIC ASSISTED OOPHORECTOMY Bilateral 05/01/2020   Procedure: XI ROBOTIC ASSISTED SALPINGECTOMIES;  Surgeon: Carrington Clamp, MD;  Location: [facility];  Service: Gynecology;  Laterality: Bilateral;    Family History  Problem Relation Age of Onset   Hypertension Mother    Heart disease Mother    Stroke Father        x 2   Cancer Father        Poems  syndrome-treated as a blood cancer-rare   Neuropathy Father    Lymphoma Maternal Uncle    Lung cancer Maternal Grandfather        deceased   Lung cancer Paternal Grandfather        ?    Social History   Socioeconomic History   Marital status: Married    Spouse name: Not on file   Number of children: 3   Years of education: Not on file   Highest education level: Bachelor's degree (e.g., BA, AB, BS)  Occupational History   Not on file  Tobacco Use   Smoking status: Former    Packs/day: 0.50    Years: 10.00    Total pack years: 5.00    Types: Cigarettes    Quit date: 08/05/2011    Years since quitting: 10.7   Smokeless tobacco: Never  Vaping Use   Vaping Use:  Never used  Substance and Sexual Activity   Alcohol use: Yes    Comment: Once a month maybe   Drug use: No   Sexual activity: Yes    Partners: Male    Birth control/protection: Pill, None  Other Topics Concern   Not on file  Social History Narrative   Self employed   Social Determinants of Health   Financial Resource Strain: Not on file  Food Insecurity: Not on file  Transportation Needs: Not on file  Physical Activity: Not on file  Stress: Not on file  Social Connections: Not on file  Intimate Partner Violence: Not on file    Review of Systems  Constitutional:  Positive for malaise/fatigue. Negative for chills and fever.  Respiratory:  Negative for shortness of breath.   Cardiovascular:  Negative for chest pain and leg swelling.  Gastrointestinal:  Positive for diarrhea. Negative for abdominal pain, constipation, nausea and vomiting.       BM every other day   Genitourinary:  Negative for dysuria and hematuria.  Neurological:  Positive for headaches. Negative for dizziness and tingling.  Psychiatric/Behavioral:  Negative for hallucinations and suicidal ideas.         Objective    BP 122/80   Pulse 98   Temp (!) 97.5 F (36.4 C)   Resp 14   Ht 5\' 4"  (1.626 m)   Wt 143 lb 6 oz (65 kg)   LMP  03/01/2020   SpO2 99%   Breastfeeding No   BMI 24.61 kg/m   Physical Exam Vitals and nursing note reviewed.  Constitutional:      Appearance: She is well-developed.  HENT:     Right Ear: Ear canal and external ear normal.     Left Ear: Tympanic membrane, ear canal and external ear normal.     Ears:     Comments: RT scarring    Mouth/Throat:     Mouth: Mucous membranes are moist.     Pharynx: Oropharynx is clear.  Eyes:     Extraocular Movements: Extraocular movements intact.     Pupils: Pupils are equal, round, and reactive to light.  Cardiovascular:     Rate and Rhythm: Normal rate and regular rhythm.     Pulses: Normal pulses.     Heart sounds: Normal heart sounds.  Pulmonary:     Effort: Pulmonary effort is normal.     Breath sounds: Normal breath sounds.  Abdominal:     General: Bowel sounds are normal. There is no distension.     Palpations: There is no mass.     Tenderness: There is no abdominal tenderness.     Hernia: No hernia is present.  Musculoskeletal:       Arms:     Right lower leg: No edema.     Left lower leg: No edema.  Lymphadenopathy:     Cervical: No cervical adenopathy.  Skin:    General: Skin is warm.  Neurological:     General: No focal deficit present.     Mental Status: She is alert.     Comments: Bilateral upper and lower extremity strength 5/5  Psychiatric:        Mood and Affect: Mood normal.        Behavior: Behavior normal.        Thought Content: Thought content normal.        Judgment: Judgment normal.         Assessment & Plan:   Problem List Items  Addressed This Visit       Other   Restless leg    Patient does describe what sounds to be like a restless leg syndrome.  We will check basic labs including TSH and iron levels today.      Relevant Orders   IBC + Ferritin   Anxiety and depression    Patient was managed by GYN in the past.  Currently on fluoxetine 40 mg.  PHQ-9 and GAD-7 administered office patient denies  HI/SI/AVH.  We will continue medication as prescribed I will take over this prescription      Preventative health care    Discussed age-appropriate immunization screening exams.      Relevant Orders   CBC   Comprehensive metabolic panel   TSH   Lipid panel   Tension headache - Primary    Patient had frequent headaches do sound tension-like in nature will write Flexeril 5 mg twice daily as needed.  Sedation precautions reviewed with patient she will update via MyChart if this helps or does not help.  Did offer to make a physical follow-up with patient but she was comfortable with MyChart follow-up      Relevant Medications   cyclobenzaprine (FLEXERIL) 5 MG tablet    Return in about 1 year (around 04/16/2023) for CPE and labs.   Audria Nine, NP

## 2022-04-15 NOTE — Assessment & Plan Note (Signed)
Discussed age-appropriate immunization screening exams.

## 2022-04-15 NOTE — Patient Instructions (Addendum)
Nice to see you today. I will be in touch with the lab results Try the muscle relaxer for the headache. It can cause sleepiness I want to see you in 1 year for your next physical, see me sooner if you need me

## 2022-04-15 NOTE — Assessment & Plan Note (Signed)
Patient was managed by GYN in the past.  Currently on fluoxetine 40 mg.  PHQ-9 and GAD-7 administered office patient denies HI/SI/AVH.  We will continue medication as prescribed I will take over this prescription

## 2022-05-06 ENCOUNTER — Other Ambulatory Visit: Payer: Self-pay | Admitting: Nurse Practitioner

## 2022-05-06 DIAGNOSIS — G44209 Tension-type headache, unspecified, not intractable: Secondary | ICD-10-CM

## 2022-05-07 DIAGNOSIS — G44209 Tension-type headache, unspecified, not intractable: Secondary | ICD-10-CM

## 2022-05-07 NOTE — Telephone Encounter (Signed)
Left message and sent mychart message to the patient 

## 2022-05-08 MED ORDER — CYCLOBENZAPRINE HCL 5 MG PO TABS: 5.0000 mg | ORAL_TABLET | Freq: Two times a day (BID) | ORAL | 0 refills | Status: AC | PRN

## 2022-12-11 ENCOUNTER — Encounter: Payer: Self-pay | Admitting: Nurse Practitioner

## 2022-12-11 ENCOUNTER — Ambulatory Visit: Payer: 59 | Admitting: Nurse Practitioner

## 2022-12-11 VITALS — BP 138/90 | HR 106 | Temp 98.7°F | Resp 16 | Ht 64.0 in | Wt 146.4 lb

## 2022-12-11 DIAGNOSIS — R0982 Postnasal drip: Secondary | ICD-10-CM

## 2022-12-11 DIAGNOSIS — J40 Bronchitis, not specified as acute or chronic: Secondary | ICD-10-CM | POA: Diagnosis not present

## 2022-12-11 DIAGNOSIS — R41840 Attention and concentration deficit: Secondary | ICD-10-CM | POA: Insufficient documentation

## 2022-12-11 MED ORDER — FLUTICASONE PROPIONATE 50 MCG/ACT NA SUSP: 2.0000 | Freq: Every day | NASAL | 0 refills | Status: AC

## 2022-12-11 MED ORDER — AZITHROMYCIN 250 MG PO TABS: ORAL_TABLET | ORAL | 0 refills | Status: AC

## 2022-12-11 NOTE — Assessment & Plan Note (Signed)
Fluticasone nasal spray 2 sprays nostril daily epistaxis precautions reviewed

## 2022-12-11 NOTE — Assessment & Plan Note (Signed)
Given length of illness will treat with azithromycin 200 mg pack as directed.  Drink plenty of fluids

## 2022-12-11 NOTE — Patient Instructions (Signed)
Nice to see you today Drink plenty of water. I have sent in an antibiotic and nasal spray Follow up with mine in 4.5 months for your physical and labs, sooner if you need me  I have made the referral for ADD testing

## 2022-12-11 NOTE — Progress Notes (Signed)
Acute Office Visit  Subjective:     Patient ID: Traci Sweeney, female    DOB: 09-15-1986, 37 y.o.   MRN: [id number]  Chief Complaint  Patient presents with   Sinus Problem   Cough    X 2 weeks coughing up green stuff     Patient is in today for Sick symptoms  Started on approx 12/01/2022 No sick contacts. States that she started it. 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  Covid vaccine: pfizer x 2 Flu vaccine: not up to date  States that she is taking the mucinex D. Has helped some   Motivation/concentration: Patient has concerns as she feels like she has difficulty doing tasks when it comes to 33 working with the visits that her husband .  She also has 3 kids and is able to do the activities that they require.  States she does not have motivation sometimes.  Patient has been treated for depression and she does not feel like this is her normal for any depressive symptoms.  Review of Systems  Constitutional:  Negative for chills and fever.  HENT:  Positive for sore throat. Negative for ear discharge, ear pain (fullness) and sinus pain.   Respiratory:  Positive for cough (has improved) and sputum production (green mainly in the morning).   Musculoskeletal:  Negative for joint pain and myalgias.  Neurological:  Negative for headaches.        Objective:    BP (!) 138/90   Pulse (!) 106   Temp 98.7 F (37.1 C)   Resp 16   Ht '5\' 4"'$  (1.626 m)   Wt 146 lb 6 oz (66.4 kg)   LMP 03/01/2020   SpO2 99%   BMI 25.13 kg/m  BP Readings from Last 3 Encounters:  12/11/22 (!) 138/90  04/15/22 122/80  05/06/21 110/68   Wt Readings from Last 3 Encounters:  12/11/22 146 lb 6 oz (66.4 kg)  04/15/22 143 lb 6 oz (65 kg)  04/30/21 163 lb 8 oz (74.2 kg)      Physical Exam Vitals and nursing note reviewed.  Constitutional:      Appearance: Normal appearance.  HENT:     Right Ear: Ear canal and external ear normal. Tympanic membrane is scarred.     Left Ear: Tympanic  membrane, ear canal and external ear normal.     Mouth/Throat:     Mouth: Mucous membranes are moist.     Pharynx: Oropharynx is clear.  Cardiovascular:     Rate and Rhythm: Normal rate and regular rhythm.     Heart sounds: Normal heart sounds.  Pulmonary:     Effort: Pulmonary effort is normal.     Breath sounds: Normal breath sounds.  Lymphadenopathy:     Cervical: No cervical adenopathy.  Neurological:     Mental Status: She is alert.     No results found for any visits on 12/11/22.      Assessment & Plan:   Problem List Items Addressed This Visit       Respiratory   Bronchitis    Given length of illness will treat with azithromycin 200 mg pack as directed.  Drink plenty of fluids      Relevant Medications   azithromycin (ZITHROMAX) 250 MG tablet   fluticasone (FLONASE) 50 MCG/ACT nasal spray     Other   Concentration deficit - Primary    Do think patient would benefit from ADD/ADHD testing.  Ambulatory referral to psychology placed  today      Relevant Orders   Ambulatory referral to Psychology   PND (post-nasal drip)    Fluticasone nasal spray 2 sprays nostril daily epistaxis precautions reviewed      Relevant Medications   fluticasone (FLONASE) 50 MCG/ACT nasal spray    Meds ordered this encounter  Medications   azithromycin (ZITHROMAX) 250 MG tablet    Sig: Take 2 tablets on day 1, then 1 tablet daily on days 2 through 5    Dispense:  6 tablet    Refill:  0    Order Specific Question:   Supervising Provider    Answer:   Loura Pardon A [[id number]]   fluticasone (FLONASE) 50 MCG/ACT nasal spray    Sig: Place 2 sprays into both nostrils daily.    Dispense:  16 g    Refill:  0    Order Specific Question:   Supervising Provider    Answer:   Loura Pardon A [[id number]]    Return in about 4 months (around 04/17/2023) for CPE and Labs.  Romilda Garret, NP

## 2022-12-11 NOTE — Assessment & Plan Note (Signed)
Do think patient would benefit from ADD/ADHD testing.  Ambulatory referral to psychology placed today

## 2023-01-02 ENCOUNTER — Other Ambulatory Visit: Payer: Self-pay | Admitting: Nurse Practitioner

## 2023-01-02 DIAGNOSIS — J40 Bronchitis, not specified as acute or chronic: Secondary | ICD-10-CM

## 2023-01-02 DIAGNOSIS — R0982 Postnasal drip: Secondary | ICD-10-CM

## 2023-03-24 ENCOUNTER — Ambulatory Visit (INDEPENDENT_AMBULATORY_CARE_PROVIDER_SITE_OTHER): Payer: 59 | Admitting: Psychology

## 2023-03-24 DIAGNOSIS — F89 Unspecified disorder of psychological development: Secondary | ICD-10-CM | POA: Diagnosis not present

## 2023-03-24 NOTE — Progress Notes (Addendum)
Date: 03/24/2023 Appointment Start Time: 9:02am Duration: 118 minutes Provider: Helmut Muster, PsyD Type of Session: Initial Appointment for Evaluation  Location of Patient: Home Location of Provider: Provider's Home (private office) Type of Contact: [employer] Teams video visit with audio  Session Content:  Prior to proceeding with today's appointment, two pieces of identifying information were obtained from Traci Sweeney to verify identity. In addition, Traci Sweeney's physical location at the time of this appointment was obtained. In the event of technical difficulties, Birtie shared a phone number she could be reached at. Lanae and this provider participated in today's telepsychological service. Traci Sweeney denied anyone else being present in the room or on the virtual appointment.  The provider's role was explained to Traci Sweeney. The provider reviewed and discussed issues of confidentiality, privacy, and limits therein (e.g., reporting obligations). In addition to verbal informed consent, written informed consent for psychological services was obtained from Traci Sweeney prior to the initial appointment. Written consent included information concerning the practice, financial arrangements, and confidentiality and patients' rights. Since the clinic is not a 24/7 crisis center, mental health emergency resources were shared, and the provider explained e-mail, voicemail, and/or other messaging systems should be utilized only for non-emergency reasons. This provider also explained that information obtained during appointments will be placed in their electronic medical record in a confidential manner. Traci Sweeney verbally acknowledged understanding of the aforementioned and agreed to use mental health emergency resources discussed if needed. Moreover, Traci Sweeney agreed information may be shared with other [facility] or their referring provider(s) as needed for coordination of care. By signing the new patient documents, Kishara provided written  consent for coordination of care. Traci Sweeney verbally acknowledged understanding she is ultimately responsible for understanding her insurance benefits as it relates to reimbursement of telepsychological and in-person services. This provider also reviewed confidentiality, as it relates to telepsychological services, as well as the rationale for telepsychological services. This provider further explained that video should not be captured, photos should not be taken, nor should testing stimuli be copied or recorded as it would be a copyright violation. Traci Sweeney expressed understanding of the aforementioned, and verbally consented to proceed. Traci Sweeney is aware of the limitations of teleheath visits and verbally consented to proceed.  Traci Sweeney completed the neurobehavioral examination, which included obtaining a, family, social, and psychiatric history; integration of prior history and other sources of clinical data to assist with clinical decision making; behavioral observations; assessment of thinking, reasoning, and judgment; and establishment of a provisional diagnosis. The evaluation was completed in 118 minutes. Codes 16109 and [id number] were billed.   Mental Status Examination:  Appearance:  neat Behavior: appropriate to circumstances Mood: neutral Affect: mood congruent and tearful Speech: pressured and tangential  Eye Contact: appropriate Psychomotor Activity: restless Thought Process: denies suicidal, homicidal, and self-harm ideation, plan and intent Content/Perceptual Disturbances: none Orientation: AAOx4 Cognition/Sensorium: easily distracted Insight: good Judgment: good  Provisional DSM-5 diagnosis(es):  F89 Unspecified Disorder of Psychological Development   Plan: Testing is expected to answer the question, does the individual meet criteria for ADHD when age, other mental health concerns (e.g., depression and anxiety), and cognitive functioning are taken into consideration? Further testing is  warranted because a diagnosis cannot be given solely based on current interview data (further data is required). Testing results are expected to answer the remaining diagnostic questions in order to provide an accurate diagnosis and assist in treatment planning with an expectation of improved clinical outcome. Traci Sweeney is currently scheduled for an appointment on 03/30/2025 at 9am via [employer] video visit  with audio.          CONFIDENTIAL PSYCHOLOGICAL EVALUATION ______________________________________________________________________________  Name: Traci Sweeney    Date of Birth: August 17, 1986    Age: 37  SOURCE AND REASON FOR REFERRAL: Ms. Traci Sweeney was referred by Traci Maes, NP for an evaluation to ascertain if she meets criteria for Attention Deficit/Hyperactivity Disorder (ADHD).    BACKGROUND INFORMATION AND PRESENTING PROBLEM: Ms. Traci Sweeney is a 37 year old female who resides in [place].  Ms. Bigalke stated she notified Traci Maes, NP that she "feel[s] like [she is] having a hard time focusing and staying on task," which led to him referring her for an ADHD evaluation. She also stated a belief her ADHD-related concerns have occurred since childhood but a recent employment change which has her "sitting more," her father-in-law's passing and her mother-in-law "struggl[ing]" with his passing, childcare responsibilities, and changes to her routine have exacerbated her ADHD-related symptomatology. She described the following ADHD-related concerns as occurring often: forgetfulness (e.g., tasks, misplacing items, and remembering needed items for tasks); task initiation (e.g., being prone to putting off tasks until last minute or once there is a sense of urgency, especially if the tasks require sustained attention or she "do[es] not have the needed information" to complete it), maintenance (e.g., becoming distracted by, and starting other tasks, which can lead to her forgetting to return  to the initially planned task), and disengagement (e.g., urge to finish tasks despite the negative repercussions caused by doing so as she is concerned she will not return to the task if she stops) issues; fidgeting and squirming, noting she finds it "hard to sit still;" being easily distracted by various stimuli (e.g., irrelevant tasks, sounds, visuals, and thoughts), noting her mind is often elsewhere even with no obvious distractions which her husband has commented on; trouble sustaining her attention while others are speaking to her; interrupting of others despite importance placed on doing so, which she attributed to worry she will forget what she wants to say and being prone to "quickly saying things;" tends to miss small details and make careless mistakes (e.g., sending emails with various errors in them and 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); disorganization (e.g., trouble starting and maintaining organization system as well as determining where to start and what steps are required to complete larger tasks); problems making and sticking to plans, which she attributed to planning requiring sustained attention, it being a "lower priority"  for her, and plans tending to "becom[e] overwhelm[ing]" as the plan's time approaches; feeling she has to be moving or doing something, internally restless, and the "mind always seems to be running a bit," which contributes to difficulty relaxing; engaging in excessive talking which she indicated primarily occurs secondary to providing unnecessary details while sharing information; trouble waiting (e.g., stating she "get[s] antsy" when required to wait and she "dislikes lines") as well as impulsivity (e.g., being prone to saying things she later regrets and tending to drive faster than others which resulted in past speeding tickets); predisposed to starting tasks without fully understanding the directions which can cause mistakes to be made as she is  "anxious to start tasks;" and being prone to becoming irritable, overwhelmed, and/or overstimulated when there is an increase in her responsibilities, changes to her routine or task demands occur, not meeting her set goals or standards, her attention is broken from a task, multiple people are trying to talk to her at one time, or when in environments with "lots" of stimuli. Ms.  Traci Sweeney also described a history of periods of depressed mood that lasted three to four months, noting she currently uses Prozac 40mg  but is uncertain of its efficacy; intermittent trouble falling asleep and commonly tossing and turning in her sleep, which she indicated is at least partially related to hip pain she experiences; forgetting to eat and occasional overeating-related behaviors (e.g., eating until uncomfortably full); and an instance of passive suicidal ideation (e.g., "It would be better to not be here") without plan or intent at the age of 57. She stated awareness of the importance of reaching out to trusted others and/or emergency services during a mental health crisis as well as confidence in her ability to keep herself safe and awareness. She expressed a belief her ADHD-related concerns are independent of mood and consistent. Ms. Traci Sweeney discussed her coping and compensatory strategies include listening to calming music; using notes, alarms, and reminders to assist with forgetfulness and tracking time; pausing or writing out what she wants to say; and reminding herself about the importance of reading the directions.  Ms. Traci Sweeney denied awareness of ever experiencing developmental milestone delays, learning disability diagnosis, grade repetition, or use of an individualized education plan, although noted a teacher "tutored [her on] reading" as she was having trouble reading. She reported being prone to "becoming anxious" if there was a time component to tasks and/or if she "could not complete a task," procrastinating class  assignments, studying, and becoming distracted during class, but she was "always a B student" until high school, at which time she became a "B/C student." Ms. Traci Sweeney discussed attending college classes over a ten-year period which she attributed to it "t[aking] multiple times to pass various tests," having trouble with the "reading requirements of classes," and difficulty sustaining her attention during lectures, although she added she "excelled in the application part" of her classes. 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 She denied having any history of employment disciplinary actions.   Ms. Traci Sweeney shared her medical history is significant for a "torn labrum" and a hysterectomy. She denied awareness of ever experiencing a seizure or head injury. She discussed she utilized psychotherapeutic services at the age of 66 as she was dating a "very controlling person" that was "not faithful" which resulted in a medical concern occurring. She reported daily use of 10oz of coffee and an occasional energy drink or soda as well as use of four-to-six standard size beers a month. She denied use of all other recreational and illicit substances. Ms. Traci Sweeney also denied ever experiencing psychiatric hospitalization; hypomanic or manic episode; obsessions or compulsions; psychosis; trauma- and stressor-related disorder symptomatology; homicidal ideation, plan, or intent; or legal involvement. She noted her familial mental health history is significant for possible ADHD (sister).   Chart Review: Per an appointment note dated 12/11/2022, NP Traci Sweeney reported "[Ms. Frumkin] has concerns as she feels like  she has difficulty doing tasks" as she "does not have motivation sometimes" as well as she "has been treated for depression and she does not feel like this is her normal for any depressive symptoms." He further reported a belief she "would benefit from ADD/ADHD testing."  Margarite Gouge, PsyD

## 2023-03-31 ENCOUNTER — Ambulatory Visit: Payer: 59 | Admitting: Psychology

## 2023-03-31 DIAGNOSIS — M545 Low back pain, unspecified: Secondary | ICD-10-CM | POA: Diagnosis not present

## 2023-03-31 DIAGNOSIS — F89 Unspecified disorder of psychological development: Secondary | ICD-10-CM

## 2023-03-31 NOTE — Progress Notes (Signed)
Date: 03/31/2023   Appointment Start Time: 9am Duration: 85 minutes Provider: Helmut Muster, PsyD Type of Session: Testing Appointment for Evaluation  Location of Patient: Home Location of Provider: Provider's Home (private office) Type of Contact: [employer] Teams video visit with audio  Session Content: Today's appointment was a telepsychological visit. Traci Sweeney is aware it is her responsibility to secure confidentiality on her end of the session. Prior to proceeding with today's appointment, Traci Sweeney's physical location at the time of this appointment was obtained as well a phone number she could be reached at in the event of technical difficulties. Traci Sweeney denied anyone else being present in the room or on the virtual appointment. This provider reviewed that video should not be captured, photos should not be taken, nor should testing stimuli be copied or recorded as it would be a copyright violation. Traci Sweeney expressed understanding of the aforementioned, and verbally consented to proceed. The WAIS-IV was administered, scored, and interpreted by this evaluator. Traci Sweeney is aware of the limitations of teleheath visits and verbally consented to proceed.  Billing codes will be input on the feedback appointment. There are no billing codes for the testing appointment.   Provisional DSM-5 diagnosis(es):  F89 Unspecified Disorder of Psychological Development   Plan: Traci Sweeney was scheduled for a feedback appointment on 04/14/2023 at 12pm via [employer] Teams video visit with audio.                Margarite Gouge, PsyD

## 2023-04-14 ENCOUNTER — Ambulatory Visit (INDEPENDENT_AMBULATORY_CARE_PROVIDER_SITE_OTHER): Payer: 59 | Admitting: Psychology

## 2023-04-14 DIAGNOSIS — F902 Attention-deficit hyperactivity disorder, combined type: Secondary | ICD-10-CM | POA: Diagnosis not present

## 2023-04-14 NOTE — Progress Notes (Signed)
Testing and Report Writing Information: The following measures  were administered, scored, and interpreted by this provider:  Generalized Anxiety Disorder-7 (GAD-7; 5 minutes), Patient Health Questionnaire-9 (PHQ-9; 5 minutes), Wechsler Adult Intelligence Scale-Fourth Edition (WAIS-IV; 70 minutes), CNS Vital Signs (45 minutes), Adult Attention Deficit/Hyperactivity Disorder Self-Report Scale Checklist (ASRSv1.1; 15 minutes), Behavior Rating Inventory for Executive Function - A - Self Report (BRIEF A; 10 minutes) and Behavior Rating Inventory for Executive Function - A - Informant  (BRIEF-A; 10 minutes) , Personality Assessment Inventory (PAI; 50 minutes). A total of 210 minutes was spent on the administration and scoring of the aforementioned measures. Codes 40981 and [phone] (6 units) were billed.  Please see the assessment for additional details. This provider completed the written report which includes integration of patient data, interpretation of standardized test results, interpretation of clinical data, review of information provided by [place] and any collateral information/documentation, and clinical decision making (240 minutes in total).  Feedback Appointment: Date: 04/14/2023 Appointment Start Time: 12pm Duration: 50 minutes Provider: Helmut Muster, PsyD Type of Session: Feedback Appointment for Evaluation  Location of Patient: Home Location of Provider: Provider's Home (private office) Type of Contact: Microsoft Teams video visit with audio  Session Content: Today's appointment was a telepsychological visit due to COVID-19. Ariele is aware it is her responsibility to secure confidentiality on her end of the session. She provided verbal consent to proceed with today's appointment. Prior to proceeding with today's appointment, Tamaiya's physical location at the time of this appointment was obtained as well a phone number she could be reached at in the event of technical difficulties. Ladora  denied anyone else being present in the room or on the virtual appointment. Aamira is aware of the limitations of teleheath visits and verbally consented to proceed.  This provider and Thressa completed the interactive feedback session which includes reviewing the aforementioned measures, treatment recommendations, and diagnostic conclusions.   The interactive feedback session was completed today and a total of 50 minutes was spent on feedback. Code 82956 was billed for feedback session.   DSM-5 Diagnosis(es):  F90.2 Attention-Deficit/Hyperactivity Disorder, Combined Presentation, Moderate  Time Requirements: Assessment scoring and interpreting: 210 minutes (billing code 21308 and [phone] [6 units]) Feedback: 50 minutes (billing code 69629) Report writing: 240 total minutes. 03/18/2023: 9:10-9:25am (inputting chart review information into the evaluation). 03/28/2023: 2-2:40pm, 3:45-4:20pm, and 9:15-9:30pm. 03/30/2023: 9:05-9:25am. 03/31/2023: 10:35-11am and 12:40-12:55pm. 03/31/2023: 3:50-3:55pm and 6:25-6:35pm. 04/12/2023: 4:15-4:20pm, 4:40-4:50pm, and 8:40-9:10pm. 04/14/2023: 8:30-8:45am.   (billing code 52841 [4 units])  Plan: Mersadies provided verbal consent for her evaluation to be sent via e-mail. No further follow-up planned by this provider.        CONFIDENTIAL PSYCHOLOGICAL EVALUATION ______________________________________________________________________________  Name: Rhae Lerner    Date of Birth: 10-13-1985    Age: 62 Dates of Evaluation: 03/24/2023, 03/25/2023, 03/27/2023, 03/28/2023, and 03/31/2023  SOURCE AND REASON FOR REFERRAL: Ms. Daryn Pisani was referred by Mordecai Maes, NP for an evaluation to ascertain if she meets criteria for Attention Deficit/Hyperactivity Disorder (ADHD).   EVALUATIVE PROCEDURES: Clinical Interview with Ms. Javeah Wamboldt (03/24/2023) Wechsler Adult Intelligence Scale-Fourth Edition (WAIS-IV; 03/31/2023) CNS Vital Signs (03/28/2023) Adult Attention  Deficit/Hyperactivity Disorder Self-Report Scale Checklist (03/28/2023) Behavior Rating Inventory for Executive Function - A - Self Report Behavior Rating Inventory for Executive Function - A - Self Report (BRIEF-A; 03/25/2023) and Informant (03/27/2023) Personality Assessment Inventory (PAI; 03/25/2023) Patient Health Questionnaire-9 (PHQ-9) Generalized Anxiety Disorder-7 (GAD-7)   BACKGROUND INFORMATION AND PRESENTING PROBLEM: Ms. Deema Juncaj is a 37 year old female who resides  in [place].  Ms. Fine stated she notified NP Mordecai Maes that she "feel[s] like [she is] having a hard time focusing and staying on task," which led to him referring her for an ADHD evaluation. She also stated a belief her ADHD-related concerns have occurred since childhood but a recent employment change which has her "sitting more," her father-in-law's passing and her mother-in-law "struggl[ing]" with his passing, childcare responsibilities, and changes to her routine have exacerbated her ADHD-related symptomatology. She described the following ADHD-related difficulties as occurring often: forgetfulness (e.g., tasks, misplacing items, and remembering needed items for tasks); task initiation (e.g., being prone to putting off tasks until last minute or once there is a sense of urgency, especially if the tasks require sustained attention or she "do[es] not have the needed information" to complete it), maintenance (e.g., becoming distracted by, and starting other tasks, which can lead to her forgetting to return to the initially planned task), and disengagement (e.g., urge to finish tasks despite the negative repercussions caused by doing so as she is concerned she will not return to the task if she stops) issues; fidgeting and squirming, noting she finds it "hard to sit still;" being easily distracted by various stimuli (e.g., irrelevant tasks, sounds, visuals, and thoughts), noting her mind is often elsewhere even with no  obvious distractions which her husband has commented on; trouble sustaining her attention while others are speaking to her; interrupting of others despite importance placed on doing so, which she attributed to worry she will forget what she wants to say and being prone to "quickly saying things;" tends to miss small details and make careless mistakes (e.g., sending emails with various errors in them and 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); disorganization (e.g., difficulty starting and maintaining organization system as well as determining where to start and what steps are required to complete larger tasks); problems making and sticking to plans, which she attributed to planning requiring sustained attention, planning being a "lower priority"  for her, and plans tending to "becom[e] overwhelm[ing]" as the plan's time approaches; feeling she has to be moving or doing something, internally restless, and that the "mind always seems to be running a bit," which contributes to difficulty relaxing; engaging in excessive talking which she indicated primarily occurs secondary to providing unnecessary details while sharing information; problems waiting (e.g., stating she "get[s] antsy" when required to wait and she "dislikes lines") as well as impulsivity (e.g., being prone to saying things she later regrets and tending to drive faster than others which resulted in past speeding tickets); predisposed to starting tasks without fully understanding the directions which can cause mistakes to be made as she is "anxious to start tasks;" and being prone to becoming irritable, overwhelmed, and/or overstimulated when there is an increase in her responsibilities, changes to her routine or task demands occur, not meeting her set goals or standards, her attention is broken from a task, multiple people are trying to talk to her at one time, or when in environments with "lots" of stimuli. Ms. Kegg also described a  history of periods of depressed mood that lasted three to four months, noting she currently uses Prozac 40mg  but is uncertain of its efficacy; intermittent trouble falling asleep and commonly tossing and turning in her sleep, which she indicated is at least partially related to hip pain she experiences; forgetting to eat and occasional overeating-related behaviors (e.g., eating until uncomfortably full); and an instance of passive suicidal ideation (e.g., "It would be  better to not be here") without plan or intent at the age of 2. She stated awareness of the importance of reaching out to trusted others and/or emergency services during a mental health crisis as well as confidence in her ability to keep herself safe and awareness. She expressed a belief her ADHD-related concerns are independent of mood and consistent. Ms. Knoop discussed her coping and compensatory strategies include listening to calming music; using notes, alarms, and reminders to assist with forgetfulness and tracking time; pausing or writing out what she wants to say; and reminding herself about the importance of reading the directions.  Ms. Medaglia denied awareness of ever experiencing developmental milestone delays, learning disability diagnosis, grade repetition, or use of an individualized education plan, although noted a teacher "tutored [her on] reading" as she was having trouble reading. She reported being prone to "becoming anxious" if there was a time component to tasks and/or if she "could not complete a task," procrastinating class assignments, studying, and becoming distracted during class, but she was "always a B student" until high school, at which time she became a "B/C student." Ms. Fujita discussed attending college classes over a ten-year period which she attributed to it "t[aking] multiple times to pass various tests," having trouble with the "reading requirements of classes," and difficulty sustaining her attention during  lectures, although she added she "excelled in the application part" of her classes. 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 She denied having any history of employment disciplinary actions.   Ms. Marandola shared her medical history is significant for a "torn labrum" and a hysterectomy. She denied awareness of ever experiencing a seizure or head injury. She discussed how she utilized psychotherapeutic services at the age of 5 as she was dating a "very controlling person" that was "not faithful" which resulted in a medical concern occurring. She reported daily use of 10oz of coffee and an occasional energy drink or soda as well as use of four-to-six standard size beers a month. She denied use of all other recreational and illicit substances. Ms. Sobecki also denied ever experiencing psychiatric hospitalization; hypomanic or manic episode; obsessions or compulsions; psychosis; trauma- and stressor-related disorder symptomatology; homicidal ideation, plan, or intent; or legal involvement. She noted her familial mental health history is significant for possible ADHD (sister).   Chart Review: Per an appointment note dated 12/11/2022, NP Mordecai Maes reported "[Ms. Mateja] has concerns as she feels like she has difficulty doing tasks" as she "does not have motivation sometimes" as well as she "has been treated for depression and she does not feel like this is her normal for any depressive symptoms." He further reported a belief she "would benefit from ADD/ADHD testing."    BEHAVIORAL OBSERVATIONS: Ms. Greenwood presented on time for the evaluation. She was well-groomed. She was oriented to time,  place, person, and purpose of the appointment. During the interview, Ms. Dunleavy often had pressured and tangential speech and fidgeted (e.g., playing with her hair, moving her seat, and adjusting her sitting position). During the evaluation, Ms. Carlberg demonstrated and/or verbalized doubt (e.g., commonly stating "I don't know" or "Maybe" after having provided a correct answer), self-expression difficulties (e.g., stating "I don't know how to  explain [the relationship between the two words]," abruptly ending an answer and indicating that she had been "rambling," and repeating or changing answers which occasionally led to lengthy answers being provided), and working memory-related issues (e.g., looking down in what seemed to be an effort to limit distractors and assist in sustaining her attention on working memory index subtests as well as noting she "lost [her] train of thought" while attempting to provide an answer to a Digit Span subtest item). Throughout the course of the evaluation, she maintained appropriate eye contact. Her thought processes and content were logical, coherent, and goal directed. There were no overt signs of a thought disorder or perceptual disturbances, nor did she report such symptomatology. There was no evidence of paraphasias (i.e., errors in speech, gross mispronunciations, and word substitutions), repetition deficits, or disturbances in volume or prosody (i.e., rhythm and intonation). Overall, based on Ms. Mares's approach to testing, the current results are believed to be a fair-good estimate of her abilities.  PROCEDURAL CONSIDERATIONS:  Psychological testing measures were conducted through a virtual visit with video and audio capabilities, but otherwise in a standard manner.   The Wechsler Adult Intelligence Scale, Fourth Edition (WAIS-IV) was administered via remote telepractice using digital stimulus materials on [employer]'s Q-global system. The remote testing environment appeared  free of distractions, adequate rapport was established with the examinee via video/audio capabilities, and Ms. Baylock appeared appropriately engaged in the task throughout the session. No significant technological problems or distractions were noted during administration. Modifications to the standardization procedure included: none. The WAIS-IV subtests, or similar tasks, have received initial validation in several samples for remote telepractice and digital format administration, and the results are considered a valid description of Ms. Yzaguirre's skills and abilities.  CLINICAL FINDINGS:  COGNITIVE FUNCTIONING  Wechsler Adult Intelligence Scale, Fourth Edition (WAIS-IV): Ms. Seiple completed subtests of the WAIS-IV, a full-scale measure of cognitive ability. The WAIS-IV is comprised of four indices that measures cognitive processes that are components of intellectual ability; however, only subtests from the Verbal Comprehension and Working Memory indices were administered. As a result, Full-Scale-IQ (FSIQ) and General Ability Index (GAI) were unable to be determined.   WAIS-IV Scale/Subtest IQ/Scaled Score 95% Confidence Interval Percentile Rank Qualitative Description Verbal Comprehension (VCI) 91 86-97 27 Average Similarities 11    Vocabulary 9    Information 5    Working Memory (WMI) 100 93-107 50 Average Digit Span 9    Arithmetic 11      The Verbal Comprehension Index (VCI) provides a measure of one's ability to receive, comprehend, and express language. It also measures the ability to retrieve previously learned information and to understand relationships between words and concepts presented orally. Ms. Mathisen obtained a VCI scaled score of 91 (27th percentile) placing her in the average range compared to same-aged peers. Her performance on the subtests comprising this index was diverse. Out of the three subtests, Ms. Mcneary demonstrated the strongest performance on the Similarities subtest,  which measured her ability to abstract meaningful concepts and relationships from verbally presented material. Her lowest performance was on the Information subtest which is primarily a measure of her fund of general knowledge but may also be influenced by cultural experience, quality of education, and ability to retrieve information from long-term memory.   The Working Memory Index (WMI) provides a measure of one's ability to sustain attention, concentrate, and exert mental control. Ms. Dorado obtained a WMI scaled score of 100 (50th percentile), placing her in the Average range compared  to same-aged peers. Ms. Barnwell demonstrated similar performance on the subtests comprising this index, although her Arithmetic score is higher than her score on Digit Span, which may indicate specific strengths in arithmetic computational skills rather than a general proficiency in working memory.  ATTENTION AND PROCESSING  CNS Vital Signs: The CNS Vital Signs assessment evaluates the neurocognitive status of an individual and covers a range of mental processes. The results of the CNS Vital Signs testing indicated low average neurocognitive processing ability. Her attentional abilities ranged from low-to-average, with simple attention in the low range, complex attention in the low average range, and sustained attention in the average range. Executive function and cognitive flexibility were in the low range. Working memory was average. Psychomotor speed, motor speed, and processing speed were average, which suggests average thinking speed and responsiveness. Reaction time was very low. Visual memory (images) and verbal memory (words) were average, which indicates they are similarly developed. The results suggest Ms. Bur experiences impairment in reaction time, cognitive flexibility, executive function, and simple attention as well as a weakness in complex attention. Upon follow-up, Ms. Brennen endorsed trouble tracking the  required patterns on part four of the CPT.   Domain  Standard Score Percentile Validity Indicator Guideline Neurocognitive Index 83 13 Yes Low Average Composite Memory 93 32 Yes Average Verbal Memory 90 42 Yes Average Visual Memory 99 47 Yes Average Psychomotor Speed 103 58 Yes Average Reaction Time 60 1 Yes Very Low Complex Attention 83 13 Yes Low Average Cognitive Flexibility 75 5 Yes Low Processing Speed  101 53 Yes Average Executive Function 73 4 Yes Low Working Memory 99 47 Yes Average Sustained Attention 95 37 Yes Average Simple Attention 76 5 Yes Low Motor Speed 103 58 Yes Average  EXECUTIVE FUNCTION  Behavior Rating Inventory of Executive Function, Second Edition (BRIEF-A) Self-Report:  Ms. Schaus completed the Self-Report Form of the Behavior Rating Inventory of Executive Function-Adult Version (BRIEF-A), which has three domains that evaluate cognitive, behavioral, and emotional regulation, and a Global Executive Composite score provides an overall snapshot of executive functioning. There are no missing item responses in the protocol. The Negativity, Infrequency, and Inconsistency scales are not elevated, suggesting she did not respond to the protocol in an overly negative, haphazard, extreme, or inconsistent manner. In the context of these validity considerations, the ratings of Ms. Alldredge's everyday executive function suggest some areas of concern. The overall index, the Global Executive Composite (GEC), was elevated (GEC T = 74, %ile = 98). Both the Behavioral Regulation (BRI) and the Metacognition (MI) Indexes were elevated (BRI T = 70, %ile = 96 and MI T = 75, %ile = 98). Ms. Burdine indicated difficultly with her ability to inhibit impulsive responses, adjust to changes in routine or task demands, monitor social behavior, initiate problem solving or activity, sustain working memory, plan and organize problem-solving approaches, and attend to task-oriented output. She did not describe  her ability to modulate emotions and organize environment and materials as problematic, although both approached an abnormal elevation. The elevated scores on the Shift and Emotional Control scales suggest she experiences significant problem-solving rigidity combined with emotional dysregulation, which may leave her prone to losing emotional control when her routine or perspective is challenged and/or flexibility is required. Moreover, the elevated scores on the Inhibit scale as well as the Behavioral Regulation and the Metacognition Indexes, suggest she experiences poor inhibitory control and/or more global behavioral dysregulation is having a negative effect on active metacognitive problem solving.  Scale/Index  Raw Score T Score Percentile Inhibit 16 65 96 Shift 13 69 98 Emotional Control 21 64 91 Self-Monitor 13 68 95 Behavioral Regulation Index (BRI) 63 70 96 Initiate 17 68 96 Working Memory 22 88 >99 Plan/Organize 21 69 98 Task Monitor 13 69 98 Organization of Materials 17 63 93 Metacognition Index (MI) 90 75 98 Global Executive Composite (GEC) 153 74 98  Validity Scale Raw Score Cumulative Percentile Protocol Classification Negativity 0 0 - 98.3 Acceptable Infrequency 0 0 - 97.3 Acceptable Inconsistency 3 0 - 99.2 Acceptable  Behavior Rating Inventory of Executive Function, Second Edition (BRIEF-A) Informant:  Ms. Angert's sister, Ms. Wilhemina Cash, completed the Informant Form of the Behavior Rating Inventory of Executive Function-Adult Version (BRIEF-A), which is equivalent to the Self-Report version and has three domains that evaluate cognitive, behavioral, and emotional regulation, and a Global Executive Composite score provides an overall snapshot of executive functioning. There are no missing item responses in the protocol. The Negativity, Infrequency, and Inconsistency scales are not elevated, suggesting she did not respond to the protocol in an overly negative, haphazard, extreme,  or inconsistent manner. In the context of these validity considerations, Ms. Young's ratings of Ms. Fischbach's everyday executive function suggest some areas of concern. The overall index, the Global Executive Composite (GEC), was within the non-elevated range for age (GEC T = 73, %ile = 81). The Behavioral Regulation (BRI) and Metacognition (MI) Indexes were within normal limits (BRI T = 55, %ile = 77 and MI T = 62, %ile = 88). Ms. Maple Hudson indicated Ms. Gill has trouble with her ability to adjust to changes in routine or task demands, sustain working memory, and attend to task-oriented output. Ms. Maple Hudson did not describe her ability to inhibit impulsive responses, modulate emotions, monitor social behavior, initiate problem solving or activity, plan and organize problem-solving approaches, and organize environment and materials problematic, although the Organization of Materials scale approached an abnormal elevation. Upon follow-up, Ms. Fettig expressed a belief her sister would likely be unaware of the executive functioning-related issues that she experiences in various other roles outside of parenting (e.g., her employment), which may at least partially explain the discrepancy between her and her sister's view of her executive functioning abilities.   Scale/Index  Raw Score T Score Percentile Inhibit 13 55 79 Shift 13 66 94 Emotional Control 19 57 78 Self-Monitor 6 39 21 Behavioral Regulation Index (BRI) 51 55 77 Initiate 12 51 67 Working Memory 19 75 98 Plan/Organize 16 54 71 Task Monitor 13 67 96 Organization of Materials 17 60 85 Metacognition Index (MI) 77 62 88 Global Executive Composite (GEC) 128 59 81  Validity Scale Raw Score Cumulative Percentile Protocol Classification Negativity 0 0 - 98.5 Acceptable Infrequency 0 0 - 93.3 Acceptable Inconsistency 4 0 - 98.8 Acceptable  BEHAVIORAL FUNCTIONING   Patient Health Questionnaire-9 (PHQ-9): Ms. Teffeteller completed the PHQ-9, a self-report  measure that assesses symptoms of depression. She scored 3/27, which indicates minimal depression.   Generalized Anxiety Disorder-7 (GAD-7): Ms. Mortell completed the GAD-7, a self-report measure that assesses symptoms of anxiety. She scored 2/21, which indicates minimal anxiety.   Adult ADHD Self-Report Scale Symptom Checklist (ASRS): Ms. Paletta reported the following symptoms as sometimes: difficulty wrapping up final details of a project following the completion of challenging aspects, problems remembering appointments or obligations, feeling overly active and compelled to do things, making careless mistakes when working on boring or difficult projects, struggling to sustain attention when doing boring or repetitive work, struggling to  concentrate on what people say even when they are speaking directly to her, feeling restless or fidgety, difficulty waiting for turn in turn taking situations, and interrupting others when they are busy. She endorsed the following symptoms as occurring often: difficulty getting things in order when a task requires organization, avoiding or delaying getting started on tasks requiring a lot of thought, fidgeting or squirming, misplacing or has difficulty finding things, difficulty relaxing, and talking too much in social situations. She endorsed the following symptoms as very often: being distracted by noise around her and interrupting others or finishing their sentences. Endorsement of at least four items in Part A is highly consistent with ADHD in adults. The frequency scores of Part B provides additional cues. Ms. Marinaro scored a 5/6 on Part A and 7/12 on Part B, which is considered a positive screening for ADHD.   Personality Assessment Inventory (PAI): The PAI is an objective inventory of adult personality. The validity indicators suggested Ms. Doyon responded consistently to similar item content (ICN T = 52) and did not appear to portray herself in an overly negative (NIM T =  51) or unrealistically favorable (PIM T = 50) manner; however, she endorsed some unusual responses (INF T = 71). This validity concern may have stemmed from confusion or idiosyncratic item interpretation, although regardless of the reason her results should be interpreted with caution. She endorsed difficulty relaxing and fatigue (ANX-A T = 60); an activity level that is somewhat higher than normal (MAN-A T = 60); and trouble with concentration and decision making (SCZ-T T = 67) which may at least partially contribute to introversion and limited interest in the lives of others (SCZ-S T = 61). She indicated having close, generally supportive relationships with friends and family (NON T = 45) and being generally satisfied with herself and seeing little need for major changes to behavior (RXR T = 53). Upon follow-up, Ms. Haffey shared upon reflecting on her answers she believes may have answered erroneously on some questions, which may at least partially explain the elevated validity indicator. She also shared her endorsement of drug use was related to her use of a prescribed anxiolytic and past use of cannabis in high school. She denied current use of illicit substances.      SUMMARY AND CLINICAL IMPRESSIONS: Ms. Aarionna Germer is a 37 year old female who was referred by NP Mordecai Maes for an evaluation to determine if she currently meets criteria for a diagnosis of Attention-Deficit/Hyperactivity Disorder (ADHD).   Ms. Tootle reported she spoke with NP Mordecai Maes about "having a hard time focusing and staying on task," which led to him referring her for an ADHD evaluation. She further reported her ADHD-related concerns have occurred since childhood but have become exacerbated recently secondary to a new employment, various stressors, and changes to her routine. She expressed a belief her ADHD-related concerns are independent of mood and consistent.  During the evaluation, Ms. Rames was administered assessments  to measure her current cognitive abilities. Her verbal comprehension abilities were in the average range, and she demonstrated the strongest performance on the Similarities subtest, which measured her ability to abstract meaningful concepts and relationships from verbally presented material. Her lowest performance was on the Information subtest which is primarily a measure of her fund of general knowledge but may also be influenced by cultural experience, quality of education, and ability to retrieve information from long-term memory. Her ability to sustain attention, concentrate, and exert mental control was also in the average range, although  her Arithmetic score is higher than her score on Digit Span, which may indicate specific strengths in arithmetic computational skills rather than a general proficiency in working memory. Results of the CNS Vital Signs indicated a low average neurocognitive processing ability. She demonstrated impairment in reaction time, cognitive flexibility, executive function, and simple attention as well as a weakness in complex attention.   During the clinical interview and on self-report measures, Ms. Tess endorsed executive functioning concerns and meeting full criteria for ADHD. Her sister, Ms. Wilhemina Cash, indicated she experiences executive functioning issues, although overall she does not perceive her as experiencing as many executive functioning as Ms. Skiff perceives herself as experiencing. Ms. Onofrio expressed a belief this discrepancy may be at least partially attributable to her sister's limited awareness of her functioning in other roles outside of parenting. While discrepant and invalid test results make interpretation difficult, when considering self-reported symptoms; endorsed and/or demonstrated impairment on measures of attention, executive functioning, cognitive flexibility, and reaction time; and a possible familial mental health history of ADHD, a diagnosis of  F90.2 Attention-Deficit/Hyperactivity Disorder, Combined Presentation, Moderate appears warranted. The specifier of "Moderate" was assigned as she indicated her ADHD symptomatology impairs her academic (e.g., commonly procrastinating assignments or studying and becoming distracted during class), social (e.g., regularly engaging in excessive talking or interrupting of others), occupational (e.g., trouble adjusting to a new employment as well as staying seated for the required periods), and daily (e.g., commonly experiencing fidgeting and internal restlessness, task initiation and completion issues, and forgetfulness) functioning.   Ms. Asman also described a history of depressed episodes that last typically last for three to four months, noting she currently uses Prozac 40mg  but is uncertain of its efficacy; intermittent trouble falling asleep and commonly tossing and turning in her sleep, which she indicated is at least partially related to hip pain; forgetting to eat and occasional overeating-related behaviors (e.g., eating until uncomfortably full); and an instance of passive suicidal ideation (e.g., "It would be better to not be here") without plan or intent at the age of 57. The PHQ-9, GAD-7, and PAI were administered. Her results suggest she experiences minimal anxiety and depression symptomatology. As such, a diagnosis of a depressive or anxiety disorder does not currently appear warranted; however, should either be ruled in, they do not appear to better explain her ADHD symptomatology as she indicated her ADHD symptoms have occurred since childhood and are independent of mood. Given the limited scope of this evaluation, it was unable to be determined if full criteria for an eating disorder is met. She would likely benefit from further evaluation of her eating behaviors to definitively rule in or out an eating disorder.   DSM-5 Diagnostic Impressions: F90.2 Attention-Deficit/Hyperactivity Disorder,  Combined Presentation, Moderate  RECOMMENDATIONS: 1. Ms. Vanalstine would likely benefit from making use of strategies for ADHD symptoms:  a. Setting a timer to complete tasks. b. Breaking tasks into manageable chunks and spreading them out over longer periods of time with breaks.  c. Utilizing lists and day calendars to keep track of tasks.  d. Answering emails daily.  e. Improving listening skills by asking the speaker to give information in smaller chunks and asking for explanation for clarification as needed. f. Leaving more than the anticipated time to complete tasks. 2. It may help to keep tasks brief, well within your attention span, and a mix of both high and low interest tasks. Tasks may be gradually increased in length. 3. Practice proactive planning by setting aside time  every evening to plan for the next day (e.g., prepare needed materials or pack the car the night before).  4. Learn how to make an effective and reasonable "to do" list of important tasks and priorities and always keep it easily accessible. Make additional copies in case it is lost or misplaced. 5. Utilize visual reminders by posting appointments, "to do lists," or schedule in strategic areas at home and at work.  6. Practice using an appointment book, smart phone or other tech device, or a daily planning calendar, and learn to write down appointments and commitments immediately. 7. Keep notepads or use a portable audio recorder to capture important ideas that would be beneficial to recall later. 8. Learn and practice time management skills. Purchase a programmable alarm watch or set an alarm on smartphone to avoid losing track of time.  9. Use a color-coded file system, desk and closet organizers, storage boxes, or other organization devices to reduce clutter and improve efficiency and structure.  10. Implement ways to become more aware of your actions and to inhibit or adjust them as warranted (e.g., reviewing videos of  your actions, consider consequences of obeying or not obeying the rules of various upcoming situations, have a trusted other to discuss plans with and/or provide cues to stop certain behaviors, and make visual cues for rules you would like to follow). 11. Stay flexible and be prepared to change your plans as symptom breakthroughs and crises are likely to occur periodically. 12. Ms. Vanderploeg may benefit from mindfulness training to address symptoms of inattention.  13. Ms. Wendel would likely benefit from a consultation regarding medication for ADHD symptoms.  14. Individual therapeutic services may assist in processing a diagnosis of ADHD and discussing coping and compensatory strategies. 15. Mental alertness/energy can be raised by increasing exercise; improving sleep; eating a healthy diet; and managing stress. Consulting with a physician regarding any changes to physical regimen is recommended. 16. "Failing at Normal: An ADHD Success Story" by Calvert Cantor is a great overview of ADHD. Dr. Janese Banks also has a YouTube channel with helpful videos on ADHD-related topics: http://www.mitchell-reyes.biz/ 17. Applications:   RescueTime. Tracks your activities on phone and/or computer to determine how productive you have been, and what distracted you. Free two-week trial.   Focus@Will . Uses engineered audio that human voice-like frequencies. Free 15-day trial.  Freedom. Allows you to highlight days and times you want to block yourself from certain sites or apps. Free trial.  Mint. Allows you to input your bank accounts and creates a visual layout of various information about your financial goals, budget management, alerts, etc. Free.  Boomerang. Gives you the option to schedule times an email is sent as well as to see if others have received or opened your email. Ten messages free per month and a free trial of premium version.  IFTTT. Uses "channels" to create various actions  (e.g., if you are mentioned in an email to highlight it in your inbox and if you miss a call to add it to a to-do list). Free and premium versions.  Unroll.me. Cleans up your email by unsubscribing from what you do not want to receive while still getting everything you do. Free.  Finish. Allows you to divide two-list tasks into short-term, mid-term, and long-term as well as how much time is left for a task. Focus mode hides non-priority tasks.   Autosilent. Turns your phone ringer on and off based on specified calendars, geo-fences, timers, etc. $3.99.  Freakyalarm. Makes  you solve math problems to disable an alarm. $1.99.  Wake N Shake. Makes you vigorously shake your phone to stop the alarm. $.99.  Todoist. Allows you to add sub-tasks to tasks as well as includes email and Web plugins to make it work across system. Premium has location-based reminders, calendar sync, productive tracking, etc.   Sleep Cycle. Utilizes your phone's motion sensors to notice movement while you are asleep. The alarm will wake you as early as 30 minutes before your alarm based on your lightest phase of sleep as well as showing you how daily activities affect your sleep quality.  18. Books:  "Taking Charge of Adult ADHD Second Edition" by Dr. Janese Banks  "The ADHD Effect on Marriage" by Annamarie Dawley  "The Couples Guide to Thriving with ADHD" by Annamarie Dawley 19. Organizations that are a reliable source of information on ADHD:   Children and Adults with Attention-Deficit/Hyperactivity Disorder (CHADD): chadd.org   Attention Deficit Disorder Association (ADDA): HotterNames.de  ADD Resources: addresources.org  ADD WareHouse: addwarehouse.com  World Federation of ADHD: adhd-federation.org  ADDConsults: FightListings.se.  Compilation of ADHD resources: https://www.harrell.com/ 20. Future evaluation if deemed necessary and/or to determine effectiveness of recommended interventions.   Helmut Muster, Psy.D. Licensed Psychologist - [facility] #[id number]               Margarite Gouge, PsyD

## 2023-04-28 ENCOUNTER — Ambulatory Visit: Payer: 59 | Admitting: Psychology

## 2023-04-30 ENCOUNTER — Encounter: Payer: Self-pay | Admitting: Nurse Practitioner

## 2023-04-30 ENCOUNTER — Ambulatory Visit (INDEPENDENT_AMBULATORY_CARE_PROVIDER_SITE_OTHER): Payer: 59 | Admitting: Nurse Practitioner

## 2023-04-30 VITALS — BP 100/72 | HR 95 | Temp 97.8°F | Ht 65.0 in | Wt 139.0 lb

## 2023-04-30 DIAGNOSIS — F32A Depression, unspecified: Secondary | ICD-10-CM | POA: Diagnosis not present

## 2023-04-30 DIAGNOSIS — Z1159 Encounter for screening for other viral diseases: Secondary | ICD-10-CM | POA: Diagnosis not present

## 2023-04-30 DIAGNOSIS — F902 Attention-deficit hyperactivity disorder, combined type: Secondary | ICD-10-CM | POA: Diagnosis not present

## 2023-04-30 DIAGNOSIS — Z87891 Personal history of nicotine dependence: Secondary | ICD-10-CM

## 2023-04-30 DIAGNOSIS — Z0000 Encounter for general adult medical examination without abnormal findings: Secondary | ICD-10-CM

## 2023-04-30 DIAGNOSIS — F419 Anxiety disorder, unspecified: Secondary | ICD-10-CM

## 2023-04-30 LAB — COMPREHENSIVE METABOLIC PANEL
ALT: 9 U/L (ref 0–35)
AST: 15 U/L (ref 0–37)
Albumin: 4.3 g/dL (ref 3.5–5.2)
Alkaline Phosphatase: 60 U/L (ref 39–117)
BUN: 9 mg/dL (ref 6–23)
CO2: 28 mEq/L (ref 19–32)
Calcium: 9.2 mg/dL (ref 8.4–10.5)
Chloride: 105 mEq/L (ref 96–112)
Creatinine, Ser: 0.73 mg/dL (ref 0.40–1.20)
GFR: 104.28 mL/min (ref >60.00)
Glucose, Bld: 66 mg/dL — ABNORMAL LOW (ref 70–99)
Potassium: 4.4 mEq/L (ref 3.5–5.1)
Sodium: 140 mEq/L (ref 135–145)
Total Bilirubin: 0.6 mg/dL (ref 0.2–1.2)
Total Protein: 6.5 g/dL (ref 6.0–8.3)

## 2023-04-30 LAB — CBC
HCT: 42.2 % (ref 36.0–46.0)
Hemoglobin: 13.7 g/dL (ref 12.0–15.0)
MCHC: 32.3 g/dL (ref 30.0–36.0)
MCV: 89.4 fl (ref 78.0–100.0)
Platelets: 352 10*3/uL (ref 150.0–400.0)
RBC: 4.72 Mil/uL (ref 3.87–5.11)
RDW: 13.8 % (ref 11.5–15.5)
WBC: 5 10*3/uL (ref 4.0–10.5)

## 2023-04-30 LAB — URINALYSIS, MICROSCOPIC ONLY

## 2023-04-30 LAB — TSH: TSH: 0.87 u[IU]/mL (ref 0.35–5.50)

## 2023-04-30 MED ORDER — HYDROXYZINE HCL 10 MG PO TABS
10.0000 mg | ORAL_TABLET | Freq: Three times a day (TID) | ORAL | 0 refills | Status: DC | PRN
Start: 2023-04-30 — End: 2023-06-01

## 2023-04-30 MED ORDER — BUPROPION HCL ER (XL) 150 MG PO TB24: 150.0000 mg | ORAL_TABLET | Freq: Every day | ORAL | 1 refills | Status: DC

## 2023-04-30 NOTE — Progress Notes (Signed)
Established Patient Office Visit  Subjective   Patient ID: Traci Sweeney, female    DOB: 05/17/1986  Age: 37 y.o. MRN: [id number]  Chief Complaint  Patient presents with   Annual Exam    CPE/labs . Ate a bite of cookie.     HPI  for complete physical and follow up of chronic conditions.   Anxiety and depression: states that she is doing ok. No HI/SI/AVH. States sometimes she sleeps well sometimes she does not. States that she will wake up sometimea dn the brain    Migraines: states thinks it is stress related. States that it is every other week. States that she is over stimulated.    Immunizations: -Tetanus: Completed in 2020 -Influenza: Out of season -Shingles: Too young  -Pneumonia: Too young -HPV: Up-to-date   Diet: Fair diet. 2 meals a day. States some snacks. States that she drinks water mostly. States some coffee and occassionly Mr Pib Exercise: No regular exercise.  Eye exam: Completes annually. contacts  Dental exam: Completes semi-annually    Colonoscopy: Too young, currently risk Lung Cancer Screening: N/A  Pap smear: no history of abnormal paps.  Has had a hysterectomy with ovaries left she is Followed by GYN  Mammogram: Too young, currently average risk  Dexa: Too young, currently average risk      Review of Systems  Constitutional:  Negative for chills and fever.  Respiratory:  Negative for shortness of breath.   Cardiovascular:  Negative for chest pain and leg swelling.  Gastrointestinal:  Negative for abdominal pain, blood in stool, constipation, diarrhea, nausea and vomiting.       BM every other day   Genitourinary:  Negative for dysuria and hematuria.  Neurological:  Negative for tingling and headaches.  Psychiatric/Behavioral:  Negative for hallucinations and suicidal ideas.       Objective:     BP 100/72   Pulse 95   Temp 97.8 F (36.6 C) (Temporal)   Ht 5\' 5"  (1.651 m)   Wt 139 lb (63 kg)   LMP 03/01/2020   SpO2 96%   BMI  23.13 kg/m  BP Readings from Last 3 Encounters:  04/30/23 100/72  12/11/22 (!) 138/90  04/15/22 122/80   Wt Readings from Last 3 Encounters:  04/30/23 139 lb (63 kg)  12/11/22 146 lb 6 oz (66.4 kg)  04/15/22 143 lb 6 oz (65 kg)      Physical Exam Vitals and nursing note reviewed.  Constitutional:      Appearance: Normal appearance.  HENT:     Right Ear: Tympanic membrane, ear canal and external ear normal.     Left Ear: Tympanic membrane, ear canal and external ear normal.     Mouth/Throat:     Mouth: Mucous membranes are moist.     Pharynx: Oropharynx is clear.  Eyes:     Extraocular Movements: Extraocular movements intact.     Pupils: Pupils are equal, round, and reactive to light.  Cardiovascular:     Rate and Rhythm: Normal rate and regular rhythm.     Pulses: Normal pulses.     Heart sounds: Normal heart sounds.  Pulmonary:     Effort: Pulmonary effort is normal.     Breath sounds: Normal breath sounds.  Abdominal:     General: Bowel sounds are normal. There is no distension.     Palpations: There is no mass.     Tenderness: There is no abdominal tenderness.     Hernia: No hernia  is present.  Musculoskeletal:     Right lower leg: No edema.     Left lower leg: No edema.  Lymphadenopathy:     Cervical: No cervical adenopathy.  Skin:    General: Skin is warm.  Neurological:     General: No focal deficit present.     Mental Status: She is alert.     Deep Tendon Reflexes:     Reflex Scores:      Bicep reflexes are 2+ on the right side and 2+ on the left side.      Patellar reflexes are 2+ on the right side and 2+ on the left side.    Comments: Bilateral upper and lower extremity strength 5/5  Psychiatric:        Mood and Affect: Mood normal.        Behavior: Behavior normal.        Thought Content: Thought content normal.        Judgment: Judgment normal.      No results found for any visits on 04/30/23.    The ASCVD Risk score (Arnett DK, et al.,  2019) failed to calculate for the following reasons:   The 2019 ASCVD risk score is only valid for ages 79 to 25    Assessment & Plan:   Problem List Items Addressed This Visit       Other   Anxiety and depression    Patient currently maintained on fluoxetine 40 mg.  States he does okay.  Patient has HI/SI/AVH.      Relevant Medications   buPROPion (WELLBUTRIN XL) 150 MG 24 hr tablet   hydrOXYzine (ATARAX) 10 MG tablet   Other Relevant Orders   TSH   Preventative health care - Primary    Discussed age-appropriate immunizations and screening exams.  Did review patient's personal, surgical, social, family histories.  Patient is up-to-date on all age-appropriate vaccinations.  Patient is too young for CRC screening or breast cancer screening.  Patient is followed by GYN for cervical cancer screening albeit she had a hysterectomy.  Patient was given information at discharge about preventative healthcare maintenance with anticipatory guidance.      Relevant Orders   CBC   Comprehensive metabolic panel   ADHD (attention deficit hyperactivity disorder), combined type    Patient was seen by psychology and diagnosed with ADHD, combined type.  Will start patient on Wellbutrin 150 mg daily to see if this is beneficial.  If not consider using a stimulant such as Adderall or Vyvanse      Relevant Medications   buPROPion (WELLBUTRIN XL) 150 MG 24 hr tablet   Other Relevant Orders   TSH   Former tobacco use    Check urine microscopy to rule out microscopic hematuria      Relevant Orders   Urine Microscopic   Other Visit Diagnoses     Encounter for hepatitis C screening test for low risk patient       Relevant Orders   Hepatitis C Antibody       Return in about 4 weeks (around 05/28/2023) for ADHD med recheck .    Audria Nine, NP

## 2023-04-30 NOTE — Assessment & Plan Note (Signed)
Patient was seen by psychology and diagnosed with ADHD, combined type.  Will start patient on Wellbutrin 150 mg daily to see if this is beneficial.  If not consider using a stimulant such as Adderall or Vyvanse

## 2023-04-30 NOTE — Assessment & Plan Note (Signed)
Patient currently maintained on fluoxetine 40 mg.  States he does okay.  Patient has HI/SI/AVH.

## 2023-04-30 NOTE — Patient Instructions (Signed)
Nice to see you today I will be in touch with the labs once I have them Follow up with me in 1 month for ADHD recheck

## 2023-04-30 NOTE — Assessment & Plan Note (Signed)
Discussed age-appropriate immunizations and screening exams.  Did review patient's personal, surgical, social, family histories.  Patient is up-to-date on all age-appropriate vaccinations.  Patient is too young for CRC screening or breast cancer screening.  Patient is followed by GYN for cervical cancer screening albeit she had a hysterectomy.  Patient was given information at discharge about preventative healthcare maintenance with anticipatory guidance.

## 2023-04-30 NOTE — Assessment & Plan Note (Signed)
Check urine microscopy to rule out microscopic hematuria

## 2023-05-05 ENCOUNTER — Encounter (INDEPENDENT_AMBULATORY_CARE_PROVIDER_SITE_OTHER): Payer: Self-pay

## 2023-05-14 ENCOUNTER — Encounter: Payer: Self-pay | Admitting: Nurse Practitioner

## 2023-05-14 DIAGNOSIS — F902 Attention-deficit hyperactivity disorder, combined type: Secondary | ICD-10-CM

## 2023-05-14 MED ORDER — AMPHETAMINE-DEXTROAMPHETAMINE 10 MG PO TABS
10.0000 mg | ORAL_TABLET | Freq: Two times a day (BID) | ORAL | 0 refills | Status: DC
Start: 2023-05-14 — End: 2023-06-11

## 2023-05-22 ENCOUNTER — Other Ambulatory Visit: Payer: Self-pay | Admitting: Nurse Practitioner

## 2023-05-22 DIAGNOSIS — F902 Attention-deficit hyperactivity disorder, combined type: Secondary | ICD-10-CM

## 2023-05-28 ENCOUNTER — Ambulatory Visit: Payer: 59 | Admitting: Nurse Practitioner

## 2023-05-30 ENCOUNTER — Other Ambulatory Visit: Payer: Self-pay | Admitting: Nurse Practitioner

## 2023-05-30 DIAGNOSIS — F32A Depression, unspecified: Secondary | ICD-10-CM

## 2023-05-30 DIAGNOSIS — F419 Anxiety disorder, unspecified: Secondary | ICD-10-CM

## 2023-06-01 NOTE — Telephone Encounter (Signed)
Called patient verified she did request refill. Took last one last night.

## 2023-06-11 ENCOUNTER — Encounter: Payer: Self-pay | Admitting: Nurse Practitioner

## 2023-06-11 ENCOUNTER — Ambulatory Visit: Payer: 59 | Admitting: Nurse Practitioner

## 2023-06-11 VITALS — BP 118/78 | HR 84 | Temp 98.0°F | Ht 65.0 in | Wt 139.0 lb

## 2023-06-11 DIAGNOSIS — F902 Attention-deficit hyperactivity disorder, combined type: Secondary | ICD-10-CM

## 2023-06-11 DIAGNOSIS — J069 Acute upper respiratory infection, unspecified: Secondary | ICD-10-CM | POA: Diagnosis not present

## 2023-06-11 MED ORDER — AMPHETAMINE-DEXTROAMPHETAMINE 10 MG PO TABS
10.0000 mg | ORAL_TABLET | Freq: Two times a day (BID) | ORAL | 0 refills | Status: DC
Start: 2023-06-11 — End: 2023-08-21

## 2023-06-11 MED ORDER — AMPHETAMINE-DEXTROAMPHETAMINE 10 MG PO TABS
10.0000 mg | ORAL_TABLET | Freq: Two times a day (BID) | ORAL | 0 refills | Status: DC
Start: 2023-06-11 — End: 2023-09-14

## 2023-06-11 NOTE — Assessment & Plan Note (Signed)
Patient using the counter treatments thus far rest drink plenty of fluid.  Reach out to me the beginning of next week if no improvement she has this a couple times a year.  Given the sinus tenderness will likely use Augmentin to cover potential sinusitis.

## 2023-06-11 NOTE — Assessment & Plan Note (Signed)
Patient was tried on Wellbutrin 100 mg daily without any effect.  Switched to Adderall 10 mg twice daily patient has no side effects.  Not having any adverse drug events.  Will continue Adderall 10 mg twice daily.  Was filled out and signed today.

## 2023-06-11 NOTE — Patient Instructions (Signed)
Nice to see you today I want to see you in 3 months, sooner if you need me  Drink plenty of fluid OTC pain relievers if needed Delsym cough syrup for cough or robitussin Let me know Monday or Tuesday if you are not improving

## 2023-06-11 NOTE — Progress Notes (Signed)
Established Patient Office Visit  Subjective   Patient ID: Traci Sweeney, female    DOB: [birth year]  Age: 37 y.o. MRN: [id number]  Chief Complaint  Patient presents with   Medication Management    Pt states she feels better. "I can focus on daily tasks better"     HPI  ADD/ADHD: Patient was seen by psychology and diagnosed with ADD/ADHD.  Patient was seen by me on 04/30/2019 for her to started Patient on bupropion 150 mg daily patient reached out to me via MyChart after couple weeks stating the medication was ineffective.  We did switch the patient to Adderall 10 mg twice daily.  Patient is here for follow-up.  She has noticed a difference with the use of the adderrall  Sick symptoms: States that your symptoms started on Monday and felt better on Tuesday.  States that she has been using ibuprofen, and flonase States that one of her kids are sick  Patient has not COVID tested at home. Patient has had the original COVID vaccines.     Review of Systems  Constitutional:  Negative for chills and fever.  HENT:  Positive for ear pain (fullness not pain) and sinus pain. Negative for ear discharge.   Respiratory:  Positive for cough and sputum production. Negative for shortness of breath.   Musculoskeletal:  Negative for joint pain and myalgias.  Neurological:  Negative for headaches.  Psychiatric/Behavioral:  The patient does not have insomnia.       Objective:     BP 118/78   Pulse 84   Temp 98 F (36.7 C) (Temporal)   Ht 5\' 5"  (1.651 m)   Wt 139 lb (63 kg)   LMP 03/01/2020   SpO2 99%   BMI 23.13 kg/m  BP Readings from Last 3 Encounters:  06/11/23 118/78  04/30/23 100/72  12/11/22 (!) 138/90   Wt Readings from Last 3 Encounters:  06/11/23 139 lb (63 kg)  04/30/23 139 lb (63 kg)  12/11/22 146 lb 6 oz (66.4 kg)      Physical Exam Vitals and nursing note reviewed.  Constitutional:      Appearance: Normal appearance.  HENT:     Head:     Comments: scarring to  the right TM    Right Ear: Ear canal and external ear normal.     Left Ear: Tympanic membrane, ear canal and external ear normal.     Nose:     Right Sinus: Maxillary sinus tenderness present. No frontal sinus tenderness.     Left Sinus: Maxillary sinus tenderness present. No frontal sinus tenderness.     Mouth/Throat:     Mouth: Mucous membranes are moist.     Pharynx: Oropharynx is clear.  Cardiovascular:     Rate and Rhythm: Normal rate and regular rhythm.     Heart sounds: Normal heart sounds.  Pulmonary:     Effort: Pulmonary effort is normal.     Breath sounds: Normal breath sounds.  Lymphadenopathy:     Cervical: Cervical adenopathy present.  Neurological:     Mental Status: She is alert.      No results found for any visits on 06/11/23.    The ASCVD Risk score (Arnett DK, et al., 2019) failed to calculate for the following reasons:   The 2019 ASCVD risk score is only valid for ages 27 to 81    Assessment & Plan:   Problem List Items Addressed This Visit       Respiratory  Upper respiratory tract infection    Patient using the counter treatments thus far rest drink plenty of fluid.  Reach out to me the beginning of next week if no improvement she has this a couple times a year.  Given the sinus tenderness will likely use Augmentin to cover potential sinusitis.        Other   ADHD (attention deficit hyperactivity disorder), combined type - Primary    Patient was tried on Wellbutrin 100 mg daily without any effect.  Switched to Adderall 10 mg twice daily patient has no side effects.  Not having any adverse drug events.  Will continue Adderall 10 mg twice daily.  Was filled out and signed today.      Relevant Medications   amphetamine-dextroamphetamine (ADDERALL) 10 MG tablet   amphetamine-dextroamphetamine (ADDERALL) 10 MG tablet   amphetamine-dextroamphetamine (ADDERALL) 10 MG tablet    Return in about 3 months (around 09/10/2023) for ADD/med recheck .     Audria Nine, NP

## 2023-06-25 ENCOUNTER — Encounter: Payer: Self-pay | Admitting: Nurse Practitioner

## 2023-08-03 DIAGNOSIS — Z01419 Encounter for gynecological examination (general) (routine) without abnormal findings: Secondary | ICD-10-CM | POA: Diagnosis not present

## 2023-08-21 ENCOUNTER — Other Ambulatory Visit: Payer: Self-pay | Admitting: Nurse Practitioner

## 2023-08-21 DIAGNOSIS — F902 Attention-deficit hyperactivity disorder, combined type: Secondary | ICD-10-CM

## 2023-08-24 MED ORDER — AMPHETAMINE-DEXTROAMPHETAMINE 10 MG PO TABS
10.0000 mg | ORAL_TABLET | Freq: Two times a day (BID) | ORAL | 0 refills | Status: DC
Start: 2023-08-24 — End: 2023-09-14

## 2023-09-14 ENCOUNTER — Encounter: Payer: Self-pay | Admitting: Nurse Practitioner

## 2023-09-14 ENCOUNTER — Ambulatory Visit: Payer: 59 | Admitting: Nurse Practitioner

## 2023-09-14 VITALS — BP 120/82 | HR 85 | Temp 99.2°F | Ht 65.0 in | Wt 137.8 lb

## 2023-09-14 DIAGNOSIS — F419 Anxiety disorder, unspecified: Secondary | ICD-10-CM | POA: Diagnosis not present

## 2023-09-14 DIAGNOSIS — R350 Frequency of micturition: Secondary | ICD-10-CM | POA: Diagnosis not present

## 2023-09-14 DIAGNOSIS — F32A Depression, unspecified: Secondary | ICD-10-CM

## 2023-09-14 DIAGNOSIS — F902 Attention-deficit hyperactivity disorder, combined type: Secondary | ICD-10-CM

## 2023-09-14 DIAGNOSIS — R8290 Unspecified abnormal findings in urine: Secondary | ICD-10-CM | POA: Diagnosis not present

## 2023-09-14 LAB — POCT URINALYSIS DIPSTICK
Bilirubin, UA: NEGATIVE
Blood, UA: POSITIVE
Glucose, UA: NEGATIVE
Ketones, UA: NEGATIVE
Leukocytes, UA: NEGATIVE
Nitrite, UA: NEGATIVE
Protein, UA: NEGATIVE
Spec Grav, UA: 1.015 (ref 1.010–1.025)
Urobilinogen, UA: 0.2 U/dL
pH, UA: 6 (ref 5.0–8.0)

## 2023-09-14 MED ORDER — AMPHETAMINE-DEXTROAMPHETAMINE 10 MG PO TABS: 10.0000 mg | ORAL_TABLET | Freq: Two times a day (BID) | ORAL | 0 refills | Status: DC

## 2023-09-14 MED ORDER — HYDROXYZINE HCL 10 MG PO TABS
10.0000 mg | ORAL_TABLET | Freq: Three times a day (TID) | ORAL | 3 refills | Status: DC | PRN
Start: 2023-09-14 — End: 2024-07-25

## 2023-09-14 NOTE — Assessment & Plan Note (Signed)
Pending urine culture.

## 2023-09-14 NOTE — Assessment & Plan Note (Signed)
Patient states hydroxyzine did help when she took it on an as-needed basis.  Patient request refill.  Refill provided today

## 2023-09-14 NOTE — Patient Instructions (Signed)
 Nice to see you today Follow up with me in 6 months, sooner if you need me

## 2023-09-14 NOTE — Assessment & Plan Note (Signed)
Patient doing well on Adderall 10 mg twice daily.  Nonopioid controlled substance agreement on file and up-to-date.  Will check urine drug screen today.  PDMP reviewed

## 2023-09-14 NOTE — Assessment & Plan Note (Signed)
UA in office 

## 2023-09-14 NOTE — Progress Notes (Signed)
Established Patient Office Visit  Subjective   Patient ID: Traci Sweeney, female    DOB: 1986/10/05  Age: 37 y.o. MRN: [id number]  Chief Complaint  Patient presents with   Follow-up    ADHD     HPI  ADHD: patient is currently maintained on adderall 10mg  BID. States that she feels like somedays it is great and some days she feels like she needs more. Statse that she feels like it is activiyt driven and mind driven    States that she does not have trouble sleeping but will help settle her mind down and does not make her sleepy   At end of visit patient admits that she is having some urinary frequency.  She states this started after recent intercourse with her spouse.  She denies fever, chills, abdominal pain, nausea, vomiting   Review of Systems  Constitutional:  Negative for chills and fever.  Respiratory:  Negative for shortness of breath.   Cardiovascular:  Negative for chest pain.  Neurological:  Negative for headaches.  Psychiatric/Behavioral:  Negative for hallucinations and suicidal ideas. The patient does not have insomnia.       Objective:     BP 120/82   Pulse 85   Temp 99.2 F (37.3 C) (Oral)   Ht 5\' 5"  (1.651 m)   Wt 137 lb 12.8 oz (62.5 kg)   LMP 03/01/2020   SpO2 99%   BMI 22.93 kg/m  BP Readings from Last 3 Encounters:  09/14/23 120/82  06/11/23 118/78  04/30/23 100/72   Wt Readings from Last 3 Encounters:  09/14/23 137 lb 12.8 oz (62.5 kg)  06/11/23 139 lb (63 kg)  04/30/23 139 lb (63 kg)   SpO2 Readings from Last 3 Encounters:  09/14/23 99%  06/11/23 99%  04/30/23 96%      Physical Exam Vitals and nursing note reviewed.  Constitutional:      Appearance: Normal appearance.  Cardiovascular:     Rate and Rhythm: Normal rate and regular rhythm.     Heart sounds: Normal heart sounds.  Pulmonary:     Effort: Pulmonary effort is normal.     Breath sounds: Normal breath sounds.  Neurological:     Mental Status: She is alert.       Results for orders placed or performed in visit on 09/14/23  POCT urinalysis dipstick  Result Value Ref Range   Color, UA yellow    Clarity, UA clear    Glucose, UA Negative Negative   Bilirubin, UA neg    Ketones, UA neg    Spec Grav, UA 1.015 1.010 - 1.025   Blood, UA pos    pH, UA 6.0 5.0 - 8.0   Protein, UA Negative Negative   Urobilinogen, UA 0.2 0.2 or 1.0 E.U./dL   Nitrite, UA neg    Leukocytes, UA Negative Negative   Appearance     Odor        The ASCVD Risk score (Arnett DK, et al., 2019) failed to calculate for the following reasons:   The 2019 ASCVD risk score is only valid for ages 47 to 56    Assessment & Plan:   Problem List Items Addressed This Visit       Other   Anxiety and depression    Patient states hydroxyzine did help when she took it on an as-needed basis.  Patient request refill.  Refill provided today      Relevant Medications   hydrOXYzine (ATARAX) 10 MG tablet  ADHD (attention deficit hyperactivity disorder), combined type    Patient doing well on Adderall 10 mg twice daily.  Nonopioid controlled substance agreement on file and up-to-date.  Will check urine drug screen today.  PDMP reviewed      Relevant Medications   amphetamine-dextroamphetamine (ADDERALL) 10 MG tablet   amphetamine-dextroamphetamine (ADDERALL) 10 MG tablet   amphetamine-dextroamphetamine (ADDERALL) 10 MG tablet   Other Relevant Orders   DRUG MONITORING, PANEL 8 WITH CONFIRMATION, URINE   Abnormal urinalysis - Primary    Pending urine culture      Relevant Orders   Urine Culture   Urinary frequency    UA in office      Relevant Orders   POCT urinalysis dipstick (Completed)    Return in about 6 months (around 03/14/2024) for ADHD.    Audria Nine, NP

## 2023-09-15 LAB — URINE CULTURE
MICRO NUMBER:: 15831925
Result:: NO GROWTH
SPECIMEN QUALITY:: ADEQUATE

## 2023-09-17 LAB — DRUG MONITORING, PANEL 8 WITH CONFIRMATION, URINE
6 Acetylmorphine: NEGATIVE ng/mL (ref <10)
Alcohol Metabolites: NEGATIVE ng/mL (ref <500)
Amphetamine: 6761 ng/mL — ABNORMAL HIGH (ref <250)
Amphetamines: POSITIVE ng/mL — AB (ref <500)
Benzodiazepines: NEGATIVE ng/mL (ref <100)
Buprenorphine, Urine: NEGATIVE ng/mL (ref <5)
Cocaine Metabolite: NEGATIVE ng/mL (ref <150)
Creatinine: 75.7 mg/dL (ref >=20.0)
Ethyl Glucuronide (ETG): NEGATIVE ng/mL (ref <500)
Ethyl Sulfate (ETS): NEGATIVE ng/mL (ref <100)
MDMA: NEGATIVE ng/mL (ref <500)
Marijuana Metabolite: NEGATIVE ng/mL (ref <20)
Methamphetamine: NEGATIVE ng/mL (ref <250)
Opiates: NEGATIVE ng/mL (ref <100)
Oxidant: NEGATIVE ug/mL (ref <200)
Oxycodone: NEGATIVE ng/mL (ref <100)
pH: 7 (ref 4.5–9.0)

## 2023-09-17 LAB — DM TEMPLATE

## 2023-10-12 ENCOUNTER — Ambulatory Visit: Payer: Self-pay | Admitting: Family Medicine

## 2023-10-27 ENCOUNTER — Ambulatory Visit (INDEPENDENT_AMBULATORY_CARE_PROVIDER_SITE_OTHER): Payer: Self-pay | Admitting: Nurse Practitioner

## 2023-10-27 VITALS — BP 100/82 | HR 86 | Temp 98.0°F | Ht 65.0 in | Wt 139.8 lb

## 2023-10-27 DIAGNOSIS — R3129 Other microscopic hematuria: Secondary | ICD-10-CM

## 2023-10-27 DIAGNOSIS — R202 Paresthesia of skin: Secondary | ICD-10-CM

## 2023-10-27 DIAGNOSIS — R21 Rash and other nonspecific skin eruption: Secondary | ICD-10-CM

## 2023-10-27 LAB — URINALYSIS, MICROSCOPIC ONLY

## 2023-10-27 LAB — CBC
HCT: 43.1 % (ref 36.0–46.0)
Hemoglobin: 14.1 g/dL (ref 12.0–15.0)
MCHC: 32.7 g/dL (ref 30.0–36.0)
MCV: 88.3 fL (ref 78.0–100.0)
Platelets: 401 10*3/uL — ABNORMAL HIGH (ref 150.0–400.0)
RBC: 4.88 Mil/uL (ref 3.87–5.11)
RDW: 13.3 % (ref 11.5–15.5)
WBC: 4.1 10*3/uL (ref 4.0–10.5)

## 2023-10-27 LAB — BASIC METABOLIC PANEL
BUN: 12 mg/dL (ref 6–23)
CO2: 29 meq/L (ref 19–32)
Calcium: 9.4 mg/dL (ref 8.4–10.5)
Chloride: 104 meq/L (ref 96–112)
Creatinine, Ser: 0.64 mg/dL (ref 0.40–1.20)
GFR: 111.67 mL/min (ref >60.00)
Glucose, Bld: 94 mg/dL (ref 70–99)
Potassium: 4.5 meq/L (ref 3.5–5.1)
Sodium: 139 meq/L (ref 135–145)

## 2023-10-27 LAB — VITAMIN B12: Vitamin B-12: 210 pg/mL — ABNORMAL LOW (ref 211–911)

## 2023-10-27 LAB — TSH: TSH: 1.44 u[IU]/mL (ref 0.35–5.50)

## 2023-10-27 MED ORDER — PREDNISONE 20 MG PO TABS: ORAL_TABLET | ORAL | 0 refills | Status: AC

## 2023-10-27 NOTE — Assessment & Plan Note (Signed)
Likely is likely secondary to herpes zoster infection.  Will check basic chemistry CBC B12 and TSH.  Patient's father does have a history of idiopathic peripheral neuropathy.  Patient has has no injury of the neck.  Will treat with steroids.  If no improvement consider getting cervical neck pictures and possible referral to neurology with patient's family history.  Prednisone 20 mg taper as prescribed

## 2023-10-27 NOTE — Progress Notes (Signed)
Acute Office Visit  Subjective:     Patient ID: Traci Sweeney, female    DOB: 1986-01-10, 38 y.o.   MRN: [id number]  Chief Complaint  Patient presents with   skin sensitivity    Pt complains of sensitivity  that can jump to fingertips, neck and right arm.States of a decrease in strength when clenching fist with right hand. Symptoms lasting over 3 weeks. Used Kenalog and hydrocortisone cream but neither has helped.     HPI Patient is in today for rash/paresthesias with a history of ADHD, anxiety, depression.  Symptoms started 3 weeks ago Symptoms started on new years eve. No injury. 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 The following day she had a seperapte group came up. States that she did use triancinolone and hydrocortisone cream that did not help. She noticed that her right index finger is a numb feeling. State that she was seen by dermatology and he was unable to to tell her what it was. He gave her a cream.  States that she is having sensitivity that is trading places form her shoulder down the right upper extremity. She feels that the right arm feels weaker to her.   She does have a history of right hip pain. On christmas her dog came by and stepped on her knee which went to the floor and she had immediate pain. States that she has scheduled an appointment with ortho. She has not had any trouble with the hip over the past several days. When she was having pain in the hip it was different that the right upper extremity    Review of Systems  Constitutional:  Negative for chills and fever.  Respiratory:  Negative for shortness of breath.   Cardiovascular:  Negative for chest pain.  Skin:  Positive for itching and rash.  Neurological:  Positive for tingling and weakness. Negative for headaches.        Objective:    BP 100/82   Pulse  86   Temp 98 F (36.7 C) (Oral)   Ht 5\' 5"  (1.651 m)   Wt 139 lb 12.8 oz (63.4 kg)   LMP 03/01/2020   SpO2 98%   BMI 23.26 kg/m    Physical Exam Vitals and nursing note reviewed.  Constitutional:      Appearance: Normal appearance.  Cardiovascular:     Rate and Rhythm: Normal rate and regular rhythm.     Heart sounds: Normal heart sounds.  Pulmonary:     Effort: Pulmonary effort is normal.     Breath sounds: Normal breath sounds.  Musculoskeletal:     Cervical back: No tenderness or bony tenderness.     Lumbar back: Negative right straight leg raise test and negative left straight leg raise test.  Skin:         Comments: Macular, crusted lesions that coalesce   Neurological:     General: No focal deficit present.     Mental Status: She is alert.     Deep Tendon Reflexes:     Reflex Scores:      Bicep reflexes are 2+ on the right side and 2+ on the left side.      Patellar reflexes are 2+ on the right side and 2+ on the left side.    Comments: Bilateral upper and lower extremity strength 5/5  No results found for any visits on 10/27/23.      Assessment & Plan:   Problem List Items Addressed This Visit       Musculoskeletal and Integument   Rash - Primary   Characteristic of the shingles rash.  It is crusted and clearing up.  Patient has been under high stress as of late        Genitourinary   Microscopic hematuria   Incidental finding previously with a negative urine culture.  Patient not have any urinary symptoms but the setting of former tobacco abuse send off her urine microscopy to rule out microscopic hematuria      Relevant Orders   Urine Microscopic     Other   Paresthesias   Likely is likely secondary to herpes zoster infection.  Will check basic chemistry CBC B12 and TSH.  Patient's father does have a history of idiopathic peripheral neuropathy.  Patient has has no injury of the neck.  Will treat with steroids.  If no improvement consider  getting cervical neck pictures and possible referral to neurology with patient's family history.  Prednisone 20 mg taper as prescribed      Relevant Medications   predniSONE (DELTASONE) 20 MG tablet   Other Relevant Orders   CBC   Basic metabolic panel   Vitamin B12   TSH    Meds ordered this encounter  Medications   predniSONE (DELTASONE) 20 MG tablet    Sig: Take 2 tablets (40 mg total) by mouth daily with breakfast for 3 days, THEN 1 tablet (20 mg total) daily with breakfast for 3 days. Avoid NSAIDs.    Dispense:  9 tablet    Refill:  0    Supervising Provider:   Roxy Manns A [[id number]]    Return if symptoms worsen or fail to improve.  Audria Nine, NP

## 2023-10-27 NOTE — Patient Instructions (Signed)
Nice to see you today Avoid NSAIDS Follow up if you do not improve

## 2023-10-27 NOTE — Assessment & Plan Note (Signed)
Characteristic of the shingles rash.  It is crusted and clearing up.  Patient has been under high stress as of late

## 2023-10-27 NOTE — Assessment & Plan Note (Signed)
Incidental finding previously with a negative urine culture.  Patient not have any urinary symptoms but the setting of former tobacco abuse send off her urine microscopy to rule out microscopic hematuria

## 2023-10-28 ENCOUNTER — Encounter: Payer: Self-pay | Admitting: Nurse Practitioner

## 2023-10-28 ENCOUNTER — Other Ambulatory Visit: Payer: Self-pay | Admitting: Nurse Practitioner

## 2023-10-28 DIAGNOSIS — E538 Deficiency of other specified B group vitamins: Secondary | ICD-10-CM

## 2023-11-05 ENCOUNTER — Telehealth: Payer: Self-pay | Admitting: Nurse Practitioner

## 2023-11-05 NOTE — Telephone Encounter (Signed)
Can we call and see how the paresthesia are doing (numbness/tingling/burning) sensation

## 2023-11-05 NOTE — Telephone Encounter (Signed)
-----   Message from [facility] sent at 10/27/2023  9:59 AM EST ----- Regarding: paresthesia See how paresthesias are with medicatoin

## 2023-11-05 NOTE — Telephone Encounter (Signed)
Contacted pt.  Pt states that pcp was right and that it was shingles. Pt states she no longer has any numbness/tingling/burning sensation.   Pt would like to know if exposure was the reason she has shingles and if she is at risk of getting shingles again. Pt wants to know how she can prevent it in the future.

## 2023-11-05 NOTE — Telephone Encounter (Signed)
Not sure how she got the shingles. It does not always have to be from an exposure. She is too young to get the shingles vaccine at this juncture.  Staying healthy and as low stress as possible would be my recommendation

## 2023-11-05 NOTE — Telephone Encounter (Signed)
Spoke to pt. Relayed Matt's message.

## 2023-11-28 ENCOUNTER — Encounter: Payer: Self-pay | Admitting: Nurse Practitioner

## 2023-11-28 DIAGNOSIS — R202 Paresthesia of skin: Secondary | ICD-10-CM

## 2024-01-06 ENCOUNTER — Encounter: Payer: Self-pay | Admitting: Neurology

## 2024-01-06 NOTE — Addendum Note (Signed)
 Addended by: Eden Emms on: 01/06/2024 12:47 PM   Modules accepted: Orders

## 2024-01-07 MED ORDER — GABAPENTIN 100 MG PO CAPS: 100.0000 mg | ORAL_CAPSULE | Freq: Every day | ORAL | 0 refills | Status: DC | PRN

## 2024-01-07 NOTE — Addendum Note (Signed)
 Addended by: Eden Emms on: 01/07/2024 07:29 AM   Modules accepted: Orders

## 2024-01-12 ENCOUNTER — Other Ambulatory Visit: Payer: Self-pay | Admitting: Nurse Practitioner

## 2024-01-12 DIAGNOSIS — F902 Attention-deficit hyperactivity disorder, combined type: Secondary | ICD-10-CM

## 2024-01-12 MED ORDER — AMPHETAMINE-DEXTROAMPHETAMINE 10 MG PO TABS: 10.0000 mg | ORAL_TABLET | Freq: Two times a day (BID) | ORAL | 0 refills | Status: DC

## 2024-02-15 ENCOUNTER — Ambulatory Visit: Payer: Self-pay | Admitting: Neurology

## 2024-02-15 ENCOUNTER — Encounter: Payer: Self-pay | Admitting: Neurology

## 2024-02-15 VITALS — BP 94/67 | HR 82 | Ht 65.0 in | Wt 135.0 lb

## 2024-02-15 DIAGNOSIS — M79604 Pain in right leg: Secondary | ICD-10-CM | POA: Diagnosis not present

## 2024-02-15 DIAGNOSIS — R202 Paresthesia of skin: Secondary | ICD-10-CM

## 2024-02-15 NOTE — Patient Instructions (Signed)
Nerve testing of the right arm and leg  ELECTROMYOGRAM AND NERVE CONDUCTION STUDIES (EMG/NCS) INSTRUCTIONS  How to Prepare The neurologist conducting the EMG will need to know if you have certain medical conditions. Tell the neurologist and other EMG lab personnel if you: Have a pacemaker or any other electrical medical device Take blood-thinning medications Have hemophilia, a blood-clotting disorder that causes prolonged bleeding Bathing Take a shower or bath shortly before your exam in order to remove oils from your skin. Don't apply lotions or creams before the exam.  What to Expect You'll likely be asked to change into a hospital gown for the procedure and lie down on an examination table. The following explanations can help you understand what will happen during the exam.  Electrodes. The neurologist or a technician places surface electrodes at various locations on your skin depending on where you're experiencing symptoms. Or the neurologist may insert needle electrodes at different sites depending on your symptoms.  Sensations. The electrodes will at times transmit a tiny electrical current that you may feel as a twinge or spasm. The needle electrode may cause discomfort or pain that usually ends shortly after the needle is removed. If you are concerned about discomfort or pain, you may want to talk to the neurologist about taking a short break during the exam.  Instructions. During the needle EMG, the neurologist will assess whether there is any spontaneous electrical activity when the muscle is at rest - activity that isn't present in healthy muscle tissue - and the degree of activity when you slightly contract the muscle.  He or she will give you instructions on resting and contracting a muscle at appropriate times. Depending on what muscles and nerves the neurologist is examining, he or she may ask you to change positions during the exam.  After your EMG You may experience some  temporary, minor bruising where the needle electrode was inserted into your muscle. This bruising should fade within several days. If it persists, contact your primary care doctor.

## 2024-02-15 NOTE — Progress Notes (Signed)
 [facility] Neurology [facility] Clinic Note - Initial Visit   Date: 02/15/2024   Traci Sweeney MRN: [id number] DOB: 12-16-1985   Dear Winthrop Hawks, NP:  Thank you for your kind referral of Traci Sweeney for consultation of numbness/tingling. Although her history is well known to you, please allow us  to reiterate it for the purpose of our medical record. The patient was accompanied to the clinic by self.    Traci Sweeney is a 38 y.o. right-handed female with ADHD, anxiety, and GERD presenting for evaluation of right arm numbness and pain.   IMPRESSION/PLAN: Right forearm pain seems to be more musculoskeletal (?lateral epicondylitis).  She reports pain with palpation and described as achy/soreness, which is worse with activity.  She may want to see PCP/sports medicine for this.    Right arm paresthesias may suggest entrapment neuropathy (CTS vs ulnar neuropathy).  I do not think paresthesias are consistent with post herpetic neuralgia, as it does not fit a dermatomal pattern.  Right leg symptoms are not consistent with neuropathy or radiculopathy.   - NCS/EMG right arm and leg  Further recommendations pending results.   ------------------------------------------------------------- History of present illness: In January 2025, she developed a red sore over the right forearm and saw her dermatologist, who thought it may have been a bite.  She developed another sore over the anterior forearm, as well.  Around the same time, she was having numbness in the right index and middle finger on the right associated with skin sensitivity of the medial forearm.  She saw her PCP who felt she had shingles.  She was give steroids and had improved rash. She was doing well for about a month, then developed intermittent shooting pain in the neck and achy pain over the lateral forearm at the elbow which is worse with overuse.  She also complains of achy and soreness over the right calf.  She denies  numbness/tingling of the right leg.     Her vitamin B12 was found to be low at 210 for which she is taking supplements.   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  She is a nonsmoker.  Rare alcohol use.    Out-side paper records, electronic medical record, and images have been reviewed where available and summarized as:  Lab Results  Component Value Date   VITAMINB12 210 (L) 10/27/2023   Lab Results  Component Value Date   TSH 1.44 10/27/2023    Past Medical History:  Diagnosis Date   Anxiety    GERD (gastroesophageal reflux disease)    Gestational diabetes    Headache    History of kidney stones    HSV-1 infection    Inguinal hernia infant   bilateral   Nephrolithiasis    with first pregnancy   PONV (postoperative nausea and vomiting)    Postpartum depression    Pre-eclampsia    Preventative health care 04/15/2022   UTI (lower urinary tract infection) 2006 - 2007    Dr. Clarke Crouch uro eval seconday to frequent infection with negative cysto    Past Surgical History:  Procedure Laterality Date   ABDOMINAL HYSTERECTOMY  June 2021   CESAREAN SECTION N/A 07/17/2015   Procedure: CESAREAN SECTION;  Surgeon: Artemisa Bile, MD;  Location: [facility];  Service: Obstetrics;  Laterality: N/A;   CESAREAN SECTION N/A 01/05/2018   Procedure: REPEAT CESAREAN SECTION;  Surgeon: Matt Song, MD;  Location: [facility];  Service: Obstetrics;  Laterality: N/A;  CESAREAN SECTION N/A 02/09/2019   Procedure: CESAREAN SECTION;  Surgeon: Reggy Capers, MD;  Location: [facility];  Service: Obstetrics;  Laterality: N/A;   CYSTOSCOPY  10/2005   negative   CYSTOSCOPY N/A 05/01/2020   Procedure: CYSTOSCOPY;  Surgeon: Matt Song, MD;  Location: [facility];  Service: Gynecology;  Laterality: N/A;   HIP ARTHROSCOPY Right 05/06/2021   Procedure: right hip scope labral repair and acetabuloplasty;  Surgeon: Janeth Medicus, MD;  Location:  [facility];  Service: Orthopedics;  Laterality: Right;   INGUINAL HERNIA REPAIR Bilateral infant   MYRINGOTOMY  2005   ROBOTIC ASSISTED TOTAL HYSTERECTOMY N/A 05/01/2020   Procedure: XI ROBOTIC ASSISTED TOTAL HYSTERECTOMY;  Surgeon: Matt Song, MD;  Location: [facility];  Service: Gynecology;  Laterality: N/A;  cautery of endometriosis   SALPINGECTOMY  2020   TONSILLECTOMY  age 81   TYMPANOPLASTY  2006   WISDOM TOOTH EXTRACTION     XI ROBOTIC ASSISTED OOPHORECTOMY Bilateral 05/01/2020   Procedure: XI ROBOTIC ASSISTED SALPINGECTOMIES;  Surgeon: Matt Song, MD;  Location: [facility];  Service: Gynecology;  Laterality: Bilateral;     Medications:  Outpatient Encounter Medications as of 02/15/2024  Medication Sig   acetaminophen  (TYLENOL ) 500 MG tablet Take 1,000 mg by mouth every 6 (six) hours as needed for moderate pain or headache.   amphetamine -dextroamphetamine  (ADDERALL) 10 MG tablet Take 1 tablet (10 mg total) by mouth 2 (two) times daily with a meal.   amphetamine -dextroamphetamine  (ADDERALL) 10 MG tablet Take 1 tablet (10 mg total) by mouth 2 (two) times daily with a meal.   amphetamine -dextroamphetamine  (ADDERALL) 10 MG tablet Take 1 tablet (10 mg total) by mouth 2 (two) times daily with a meal.   aspirin-acetaminophen -caffeine (EXCEDRIN MIGRAINE) 250-250-65 MG tablet Take 2 tablets by mouth every 6 (six) hours as needed for headache.   clobetasol cream (TEMOVATE) 0.05 % Apply topically 2 (two) times daily as needed.   diphenhydrAMINE  (BENADRYL ) 25 MG tablet Take 25 mg by mouth every 6 (six) hours as needed for allergies or itching.   FLUoxetine  (PROZAC ) 40 MG capsule Take 40 mg by mouth at bedtime.   fluticasone  (FLONASE ) 50 MCG/ACT nasal spray Place 2 sprays into both nostrils daily.   gabapentin  (NEURONTIN ) 100 MG capsule Take 1 capsule (100 mg total) by mouth daily as needed.   hydrOXYzine  (ATARAX ) 10 MG tablet Take 1 tablet (10 mg total)  by mouth 3 (three) times daily as needed.   [DISCONTINUED] meloxicam (MOBIC) 15 MG tablet Take 1 tablet every day by oral route with meal(s). (Patient not taking: Reported on 02/15/2024)   No facility-administered encounter medications on file as of 02/15/2024.    Allergies: No Known Allergies  Family History: Family History  Problem Relation Age of Onset   Hypertension Mother    Heart disease Mother    Stroke Father        x 2   Cancer Father        Poems syndrome-treated as a blood cancer-rare   Neuropathy Father    Lung cancer Maternal Grandfather        deceased   Lung cancer Paternal Grandfather        ?   Lymphoma Maternal Uncle    Lung cancer Paternal Aunt        part of lung taken out.    Social History: Social History   Tobacco Use   Smoking status: Former    Current packs/day: 0.00  Average packs/day: 0.5 packs/day for 10.0 years (5.0 ttl pk-yrs)    Types: Cigarettes    Start date: 08/04/2001    Quit date: 08/05/2011    Years since quitting: 12.5   Smokeless tobacco: Never  Vaping Use   Vaping status: Never Used  Substance Use Topics   Alcohol use: Yes    Comment: Occasional Drink   Drug use: No   Social History   Social History Narrative   Are you right handed or left handed? Right Handed    Are you currently employed ? Self Employed   What is your current occupation?   Do you live at home alone? No    Who lives with you? Husband and kids    What type of home do you live in: 1 story or 2 story? Lives in a one story home.        Vital Signs:  BP 94/67   Pulse 82   Ht 5\' 5"  (1.651 m)   Wt 135 lb (61.2 kg)   LMP 03/01/2020   SpO2 99%   BMI 22.47 kg/m   Neurological Exam: MENTAL STATUS including orientation to time, place, person, recent and remote memory, attention span and concentration, language, and fund of knowledge is normal.  Speech is not dysarthric.  CRANIAL NERVES: II:  No visual field defects.     III-IV-VI: Pupils equal round  and reactive to light.  Normal conjugate, extra-ocular eye movements in all directions of gaze.  No nystagmus.  No ptosis.   V:  Normal facial sensation.    VII:  Normal facial symmetry and movements.   VIII:  Normal hearing and vestibular function.   IX-X:  Normal palatal movement.   XI:  Normal shoulder shrug and head rotation.   XII:  Normal tongue strength and range of motion, no deviation or fasciculation.  MOTOR:  Pain with palpation over the proximal extensor muscles in the right forearm.  No atrophy, fasciculations or abnormal movements.  No pronator drift.   Upper Extremity:  Right  Left  Deltoid  5/5   5/5   Biceps  5/5   5/5   Triceps  5/5   5/5   Wrist extensors  5/5   5/5   Wrist flexors  5/5   5/5   Finger extensors  5/5   5/5   Finger flexors  5/5   5/5   Dorsal interossei  5/5   5/5   Abductor pollicis  5/5   5/5   Tone (Ashworth scale)  0  0   Lower Extremity:  Right  Left  Hip flexors  5/5   5/5   Knee flexors  5/5   5/5   Knee extensors  5/5   5/5   Dorsiflexors  5/5   5/5   Plantarflexors  5/5   5/5   Toe extensors  5/5   5/5   Toe flexors  5/5   5/5   Tone (Ashworth scale)  0  0   MSRs:                                           Right        Left brachioradialis 2+  2+  biceps 2+  2+  triceps 2+  2+  patellar 2+  2+  ankle jerk 2+  2+  Hoffman no  no  plantar response down  down   SENSORY:  Normal and symmetric perception of light touch, pinprick, vibration, and temperature.  Romberg's sign absent.   COORDINATION/GAIT: Normal finger-to- nose-finger.  Intact rapid alternating movements bilaterally.  Gait narrow based and stable. Tandem and stressed gait intact.     Thank you for allowing me to participate in patient's care.  If I can answer any additional questions, I would be pleased to do so.    Sincerely,    Yaroslav Gombos K. Lydia Sams, DO

## 2024-03-10 ENCOUNTER — Encounter: Payer: Self-pay | Admitting: Nurse Practitioner

## 2024-03-14 ENCOUNTER — Ambulatory Visit: Payer: 59 | Admitting: Nurse Practitioner

## 2024-03-16 DIAGNOSIS — M25551 Pain in right hip: Secondary | ICD-10-CM | POA: Diagnosis not present

## 2024-03-30 ENCOUNTER — Encounter: Payer: Self-pay | Admitting: Nurse Practitioner

## 2024-03-30 ENCOUNTER — Ambulatory Visit: Admitting: Neurology

## 2024-03-30 DIAGNOSIS — R202 Paresthesia of skin: Secondary | ICD-10-CM | POA: Diagnosis not present

## 2024-03-30 DIAGNOSIS — G5601 Carpal tunnel syndrome, right upper limb: Secondary | ICD-10-CM

## 2024-03-30 DIAGNOSIS — M79604 Pain in right leg: Secondary | ICD-10-CM

## 2024-03-30 NOTE — Procedures (Signed)
 [facility]  [address] [place], [address]  [place], [place] [address] Tel: [phone] Fax: [phone] Test Date:  03/30/2024  Patient: Traci Sweeney DOB: Feb 28, 1986 Physician: Tonita Blanch, DO  Sex: Female Height: 5' 5 Ref Phys: Tonita Blanch, DO  ID#: [id number]   Technician:    History: This is a 38 year old female referred for evaluation of right arm and leg paresthesias.  NCV & EMG Findings: Extensive electrodiagnostic testing of the right upper and lower extremity shows:  Right median sensory response shows prolonged latency (4.3 ms) and reduced amplitude (15.0 V).  Right ulnar, sural, and superficial peroneal sensory responses are within normal limits.   Right median, ulnar, peroneal, and tibial motor responses are within normal limits.   Right tibial H reflex study is within normal limits.   There is no evidence of active or chronic motor axonal loss changes affecting any of the tested muscles.  Motor unit configuration and recruitment pattern is within normal limits.    Impression: Right median neuropathy at or distal to the wrist, consistent with a clinical diagnosis of carpal tunnel syndrome.  Overall, these findings are moderate in degree electrically. There is no evidence of a large fiber sensorimotor polyneuropathy or cervical/lumbosacral radiculopathy affecting the right side.   ___________________________ Tonita Blanch, DO    Nerve Conduction Studies   Stim Site NR Peak (ms) Norm Peak (ms) O-P Amp (V) Norm O-P Amp  Right Median Anti Sensory (2nd Digit)  32 C  Wrist    *4.3 <3.4 *15.0 >20  Right Sup Peroneal Anti Sensory (Ant Lat Mall)  32 C  12 cm    2.3 <4.5 13.4 >5  Right Sural Anti Sensory (Lat Mall)  32 C  Calf    2.6 <4.5 18.2 >5  Right Ulnar Anti Sensory (5th Digit)  32 C  Wrist    2.3 <3.1 32.0 >12     Stim Site NR Onset (ms) Norm Onset (ms) O-P Amp (mV) Norm O-P Amp Site1 Site2 Delta-0 (ms) Dist (cm) Vel (m/s) Norm Vel (m/s)  Right  Median Motor (Abd Poll Brev)  32 C  Wrist    3.4 <3.9 10.6 >6 Elbow Wrist 4.7 29.0 62 >50  Elbow    8.1  10.2         Right Peroneal Motor (Ext Dig Brev)  32 C  Ankle    3.0 <5.5 8.2 >3 B Fib Ankle 6.7 36.0 54 >40  B Fib    9.7  7.6  Poplt B Fib 1.3 7.0 54 >40  Poplt    11.0  7.6         Right Tibial Motor (Abd Hall Brev)  32 C  Ankle    3.4 <6.0 9.3 >8 Knee Ankle 7.4 40.0 54 >40  Knee    10.8  7.3         Right Ulnar Motor (Abd Dig Minimi)  32 C  Wrist    2.0 <3.1 10.0 >7 B Elbow Wrist 3.4 22.0 65 >50  B Elbow    5.4  9.8  A Elbow B Elbow 1.5 10.0 67 >50  A Elbow    6.9  9.8          Electromyography   Side Muscle Ins.Act Fibs Fasc Recrt Amp Dur Poly Activation Comment  Right 1stDorInt Nml Nml Nml Nml Nml Nml Nml Nml N/A  Right Abd Poll Brev Nml Nml Nml Nml Nml Nml Nml Nml N/A  Right PronatorTeres Nml Nml Nml Nml  Nml Nml Nml Nml N/A  Right Biceps Nml Nml Nml Nml Nml Nml Nml Nml N/A  Right Triceps Nml Nml Nml Nml Nml Nml Nml Nml N/A  Right Deltoid Nml Nml Nml Nml Nml Nml Nml Nml N/A  Right AntTibialis Nml Nml Nml Nml Nml Nml Nml Nml N/A  Right Gastroc Nml Nml Nml Nml Nml Nml Nml Nml N/A  Right Flex Dig Long Nml Nml Nml Nml Nml Nml Nml Nml N/A  Right RectFemoris Nml Nml Nml Nml Nml Nml Nml Nml N/A  Right BicepsFemS Nml Nml Nml Nml Nml Nml Nml Nml N/A  Right GluteusMed Nml Nml Nml Nml Nml Nml Nml Nml N/A      Waveforms:

## 2024-03-31 ENCOUNTER — Ambulatory Visit: Payer: Self-pay | Admitting: Neurology

## 2024-03-31 ENCOUNTER — Ambulatory Visit: Admitting: Nurse Practitioner

## 2024-04-12 DIAGNOSIS — G5601 Carpal tunnel syndrome, right upper limb: Secondary | ICD-10-CM | POA: Diagnosis not present

## 2024-04-12 DIAGNOSIS — M7711 Lateral epicondylitis, right elbow: Secondary | ICD-10-CM | POA: Diagnosis not present

## 2024-04-20 DIAGNOSIS — M25551 Pain in right hip: Secondary | ICD-10-CM | POA: Diagnosis not present

## 2024-04-21 ENCOUNTER — Ambulatory Visit: Admitting: Nurse Practitioner

## 2024-04-24 ENCOUNTER — Encounter: Payer: Self-pay | Admitting: Nurse Practitioner

## 2024-04-24 ENCOUNTER — Ambulatory Visit (INDEPENDENT_AMBULATORY_CARE_PROVIDER_SITE_OTHER): Admitting: Nurse Practitioner

## 2024-04-24 VITALS — BP 108/80 | HR 62 | Temp 97.4°F | Ht 65.0 in | Wt 143.4 lb

## 2024-04-24 DIAGNOSIS — F902 Attention-deficit hyperactivity disorder, combined type: Secondary | ICD-10-CM

## 2024-04-24 MED ORDER — AMPHETAMINE-DEXTROAMPHETAMINE 15 MG PO TABS: 15.0000 mg | ORAL_TABLET | Freq: Two times a day (BID) | ORAL | 0 refills | Status: DC

## 2024-04-24 NOTE — Progress Notes (Signed)
   Established Patient Office Visit  Subjective   Patient ID: Traci Sweeney, female    DOB: 27-Sep-1986  Age: 38 y.o. MRN: [id number]  Chief Complaint  Patient presents with   Follow-up    ADHD    HPI  ADHD: Patient is currently maintained on Adderall 10 mg twice daily. Patient was seen by psychology and diagnosed with ADHD combined type.  She previously tried bupropion  150 mg daily and was ineffective. States that over the past few weeks she has felt like she is wanting to take the second dose sooner. States that she is having trouble staying on task States that she is doing ok on sleep   States that she went to camp with her daughter and ate poorly.     Review of Systems  Constitutional:  Negative for chills and fever.  Respiratory:  Negative for shortness of breath.   Cardiovascular:  Negative for chest pain.  Neurological:  Negative for headaches.      Objective:     BP 108/80   Pulse 62   Temp (!) 97.4 F (36.3 C) (Oral)   Ht 5' 5 (1.651 m)   Wt 143 lb 6.4 oz (65 kg)   LMP 03/01/2020   SpO2 98%   BMI 23.86 kg/m  BP Readings from Last 3 Encounters:  04/24/24 108/80  02/15/24 94/67  10/27/23 100/82   Wt Readings from Last 3 Encounters:  04/24/24 143 lb 6.4 oz (65 kg)  02/15/24 135 lb (61.2 kg)  10/27/23 139 lb 12.8 oz (63.4 kg)   SpO2 Readings from Last 3 Encounters:  04/24/24 98%  02/15/24 99%  10/27/23 98%      Physical Exam Vitals and nursing note reviewed.  Constitutional:      Appearance: Normal appearance.  Cardiovascular:     Rate and Rhythm: Normal rate and regular rhythm.     Heart sounds: Normal heart sounds.  Pulmonary:     Effort: Pulmonary effort is normal.     Breath sounds: Normal breath sounds.  Neurological:     Mental Status: She is alert.      No results found for any visits on 04/24/24.    The ASCVD Risk score (Arnett DK, et al., 2019) failed to calculate for the following reasons:   The 2019 ASCVD risk score is  only valid for ages 8 to 46    Assessment & Plan:   Problem List Items Addressed This Visit       Other   ADHD (attention deficit hyperactivity disorder), combined type - Primary   Patient was maintained on Adderall 10 mg twice daily.  She was having minimal relief with dosing.  Will increase Adderall to 15 mg twice daily.  PDMP reviewed.      Relevant Medications   amphetamine -dextroamphetamine  (ADDERALL) 15 MG tablet   amphetamine -dextroamphetamine  (ADDERALL) 15 MG tablet   amphetamine -dextroamphetamine  (ADDERALL) 15 MG tablet    Return in about 3 months (around 07/25/2024) for CPE and Labs.    Adina Crandall, NP

## 2024-04-24 NOTE — Assessment & Plan Note (Signed)
 Patient was maintained on Adderall 10 mg twice daily.  She was having minimal relief with dosing.  Will increase Adderall to 15 mg twice daily.  PDMP reviewed.

## 2024-04-24 NOTE — Patient Instructions (Signed)
 Nice to see you today!

## 2024-05-25 DIAGNOSIS — M25551 Pain in right hip: Secondary | ICD-10-CM | POA: Diagnosis not present

## 2024-06-09 DIAGNOSIS — M25551 Pain in right hip: Secondary | ICD-10-CM | POA: Diagnosis not present

## 2024-06-26 DIAGNOSIS — M25551 Pain in right hip: Secondary | ICD-10-CM | POA: Diagnosis not present

## 2024-07-05 DIAGNOSIS — M25551 Pain in right hip: Secondary | ICD-10-CM | POA: Diagnosis not present

## 2024-07-25 ENCOUNTER — Encounter: Payer: Self-pay | Admitting: Nurse Practitioner

## 2024-07-25 ENCOUNTER — Ambulatory Visit: Admitting: Nurse Practitioner

## 2024-07-25 VITALS — BP 110/68 | HR 74 | Temp 97.8°F | Ht 64.0 in | Wt 139.6 lb

## 2024-07-25 DIAGNOSIS — Z126 Encounter for screening for malignant neoplasm of bladder: Secondary | ICD-10-CM

## 2024-07-25 DIAGNOSIS — Z0000 Encounter for general adult medical examination without abnormal findings: Secondary | ICD-10-CM

## 2024-07-25 DIAGNOSIS — Z87891 Personal history of nicotine dependence: Secondary | ICD-10-CM | POA: Diagnosis not present

## 2024-07-25 DIAGNOSIS — Z131 Encounter for screening for diabetes mellitus: Secondary | ICD-10-CM | POA: Diagnosis not present

## 2024-07-25 DIAGNOSIS — E538 Deficiency of other specified B group vitamins: Secondary | ICD-10-CM

## 2024-07-25 DIAGNOSIS — F32A Depression, unspecified: Secondary | ICD-10-CM | POA: Diagnosis not present

## 2024-07-25 DIAGNOSIS — F419 Anxiety disorder, unspecified: Secondary | ICD-10-CM | POA: Diagnosis not present

## 2024-07-25 DIAGNOSIS — Z13220 Encounter for screening for lipoid disorders: Secondary | ICD-10-CM | POA: Diagnosis not present

## 2024-07-25 DIAGNOSIS — F902 Attention-deficit hyperactivity disorder, combined type: Secondary | ICD-10-CM

## 2024-07-25 LAB — CBC WITH DIFFERENTIAL/PLATELET
Basophils Absolute: 0 K/uL (ref 0.0–0.1)
Basophils Relative: 0.3 % (ref 0.0–3.0)
Eosinophils Absolute: 0 K/uL (ref 0.0–0.7)
Eosinophils Relative: 1.1 % (ref 0.0–5.0)
HCT: 39.8 % (ref 36.0–46.0)
Hemoglobin: 13.2 g/dL (ref 12.0–15.0)
Lymphocytes Relative: 30.6 % (ref 12.0–46.0)
Lymphs Abs: 1.3 K/uL (ref 0.7–4.0)
MCHC: 33.2 g/dL (ref 30.0–36.0)
MCV: 86.3 fl (ref 78.0–100.0)
Monocytes Absolute: 0.4 K/uL (ref 0.1–1.0)
Monocytes Relative: 8.6 % (ref 3.0–12.0)
Neutro Abs: 2.6 K/uL (ref 1.4–7.7)
Neutrophils Relative %: 59.4 % (ref 43.0–77.0)
Platelets: 299 K/uL (ref 150.0–400.0)
RBC: 4.61 Mil/uL (ref 3.87–5.11)
RDW: 12.7 % (ref 11.5–15.5)
WBC: 4.4 K/uL (ref 4.0–10.5)

## 2024-07-25 LAB — COMPREHENSIVE METABOLIC PANEL WITH GFR
ALT: 9 U/L (ref 0–35)
AST: 12 U/L (ref 0–37)
Albumin: 4.5 g/dL (ref 3.5–5.2)
Alkaline Phosphatase: 51 U/L (ref 39–117)
BUN: 12 mg/dL (ref 6–23)
CO2: 29 meq/L (ref 19–32)
Calcium: 9 mg/dL (ref 8.4–10.5)
Chloride: 104 meq/L (ref 96–112)
Creatinine, Ser: 0.68 mg/dL (ref 0.40–1.20)
GFR: 109.48 mL/min (ref >60.00)
Glucose, Bld: 77 mg/dL (ref 70–99)
Potassium: 4.1 meq/L (ref 3.5–5.1)
Sodium: 140 meq/L (ref 135–145)
Total Bilirubin: 1 mg/dL (ref 0.2–1.2)
Total Protein: 6.4 g/dL (ref 6.0–8.3)

## 2024-07-25 LAB — LIPID PANEL
Cholesterol: 179 mg/dL (ref 0–200)
HDL: 75.7 mg/dL (ref >39.00)
LDL Cholesterol: 93 mg/dL (ref 0–99)
NonHDL: 103.4
Total CHOL/HDL Ratio: 2
Triglycerides: 54 mg/dL (ref 0.0–149.0)
VLDL: 10.8 mg/dL (ref 0.0–40.0)

## 2024-07-25 LAB — VITAMIN B12: Vitamin B-12: 232 pg/mL (ref 211–911)

## 2024-07-25 LAB — HEMOGLOBIN A1C: Hgb A1c MFr Bld: 5.4 % (ref 4.6–6.5)

## 2024-07-25 LAB — URINALYSIS, MICROSCOPIC ONLY

## 2024-07-25 LAB — TSH: TSH: 1.05 u[IU]/mL (ref 0.35–5.50)

## 2024-07-25 MED ORDER — AMPHETAMINE-DEXTROAMPHETAMINE 15 MG PO TABS: 15.0000 mg | ORAL_TABLET | Freq: Two times a day (BID) | ORAL | 0 refills | Status: AC

## 2024-07-25 NOTE — Assessment & Plan Note (Signed)
 History of the same currently maintained on Adderall 50 mg twice daily.  PDMP reviewed.  Continue medication as prescribed

## 2024-07-25 NOTE — Patient Instructions (Signed)
 Nice to see you today  I have sent in the adderall We did update your flu vaccine today  Follow up with me in 6 months, sooner if you need me

## 2024-07-25 NOTE — Progress Notes (Signed)
 Established Patient Office Visit  Subjective   Patient ID: Traci Sweeney, female    DOB: [birth year]  Age: 38 y.o. MRN: [id number]  Chief Complaint  Patient presents with   Annual Exam    HPI   ADHD: Patient currently maintained on Adderall 15 mg twice daily. Doing well on medications  Anxiety/depression: Patient currently maintained on fluoxetine  40 mg daily. GYN. Unsure if she is having benefits. States that it has been for 6 years. States taht she does not feel like she did post partum   for complete physical and follow up of chronic conditions.  Immunizations: -Tetanus: Completed in 2020 -Influenza: up date today  -Shingles: Too young -Pneumonia: Too young -HPV: Up-to-date  Diet: Fair diet. 2 meals a day and a shake in the am. States that she will drink water throughout the day. She will have a soda in the evening and coffee in the mornings Exercise: No regular exercise.  Eye exam: Completes annually. Contacts   Dental exam: Completes semi-annually    Colonoscopy: Too young, currently average risk Lung Cancer Screening: NA  Pap smear: Hysterectomy  Mammogram: Too young, currently average risk  DEXA: Too young  Sleep: going to bed around 1030-11 and will get up around 630. Does feel rested sometimes. States that     Review of Systems  Constitutional:  Negative for chills and fever.  Respiratory:  Negative for shortness of breath.   Cardiovascular:  Negative for chest pain and leg swelling.  Gastrointestinal:  Negative for abdominal pain, blood in stool, constipation, diarrhea, nausea and vomiting.  Genitourinary:  Negative for dysuria and hematuria.  Musculoskeletal:  Positive for joint pain.  Neurological:  Negative for tingling and headaches.  Psychiatric/Behavioral:  Negative for hallucinations and suicidal ideas.       Objective:     BP 110/68   Pulse 74   Temp 97.8 F (36.6 C) (Oral)   Ht 5' 4 (1.626 m)   Wt 139 lb 9.6 oz (63.3 kg)   LMP  03/01/2020   SpO2 99%   BMI 23.96 kg/m  BP Readings from Last 3 Encounters:  07/25/24 110/68  04/24/24 108/80  02/15/24 94/67   Wt Readings from Last 3 Encounters:  07/25/24 139 lb 9.6 oz (63.3 kg)  04/24/24 143 lb 6.4 oz (65 kg)  02/15/24 135 lb (61.2 kg)   SpO2 Readings from Last 3 Encounters:  07/25/24 99%  04/24/24 98%  02/15/24 99%      Physical Exam Vitals and nursing note reviewed.  Constitutional:      Appearance: Normal appearance.  HENT:     Right Ear: Ear canal and external ear normal.     Left Ear: Tympanic membrane, ear canal and external ear normal.     Ears:     Comments: Scarring to right TM    Mouth/Throat:     Mouth: Mucous membranes are moist.     Pharynx: Oropharynx is clear.  Eyes:     Extraocular Movements: Extraocular movements intact.     Pupils: Pupils are equal, round, and reactive to light.  Cardiovascular:     Rate and Rhythm: Normal rate and regular rhythm.     Pulses: Normal pulses.     Heart sounds: Normal heart sounds.  Pulmonary:     Effort: Pulmonary effort is normal.     Breath sounds: Normal breath sounds.  Abdominal:     General: Bowel sounds are normal. There is no distension.     Palpations:  There is no mass.     Tenderness: There is no abdominal tenderness.     Hernia: No hernia is present.  Musculoskeletal:     Right lower leg: No edema.     Left lower leg: No edema.  Lymphadenopathy:     Cervical: No cervical adenopathy.  Skin:    General: Skin is warm.  Neurological:     General: No focal deficit present.     Mental Status: She is alert.     Deep Tendon Reflexes:     Reflex Scores:      Bicep reflexes are 2+ on the right side and 2+ on the left side.      Patellar reflexes are 2+ on the right side and 2+ on the left side.    Comments: Bilateral upper and lower extremity strength 5/5  Psychiatric:        Mood and Affect: Mood normal.        Behavior: Behavior normal.        Thought Content: Thought content  normal.        Judgment: Judgment normal.      No results found for any visits on 07/25/24.    The ASCVD Risk score (Arnett DK, et al., 2019) failed to calculate for the following reasons:   The 2019 ASCVD risk score is only valid for ages 13 to 66    Assessment & Plan:   Problem List Items Addressed This Visit       Other   Anxiety and depression   History of the same started with postpartum.  Patient has been on medication for extended period time unsure if it is beneficial.  Patient has a lot coming up over the next few months.  She will reach out to me if she wants to try to wean down on the medication to see if the efficacy is still there.  Will plan to wean from fluoxetine  40 mg to fluoxetine  20 mg daily whenever patient is ready.  Patient denies HI/SI/AVH.      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  Did review patient's personal, surgical, social, family histories.  Patient is up-to-date on all age-appropriate vaccinations she would like.  Update flu vaccine today.  Patient is too young for CRC screening.  Too young for breast cancer screening.  Patient no longer does cervical cancer screening as she is status post hysterectomy.  Patient was given information at discharge about preventative healthcare maintenance with anticipatory guidance      Relevant Orders   CBC with Differential/Platelet   Comprehensive metabolic panel with GFR   TSH   ADHD (attention deficit hyperactivity disorder), combined type   History of the same currently maintained on Adderall 50 mg twice daily.  PDMP reviewed.  Continue medication as prescribed      Relevant Medications   amphetamine -dextroamphetamine  (ADDERALL) 15 MG tablet   amphetamine -dextroamphetamine  (ADDERALL) 15 MG tablet   amphetamine -dextroamphetamine  (ADDERALL) 15 MG tablet   Former tobacco use   Pending urine microscopy rule out microscopic hematuria      Vitamin B12  deficiency   History of the same.  Pending vitamin B12 level today      Relevant Orders   Vitamin B12   Other Visit Diagnoses       Screening for lipid disorders       Relevant Orders   Lipid panel     Screening for diabetes mellitus  Relevant Orders   Hemoglobin A1c     Screening for bladder cancer       Relevant Orders   Urine Microscopic       Return in about 6 months (around 01/23/2025) for ADD/ADHD recheck.    Adina Crandall, NP

## 2024-07-25 NOTE — Assessment & Plan Note (Signed)
 History of the same.  Pending vitamin B12 level today

## 2024-07-25 NOTE — Assessment & Plan Note (Signed)
 History of the same started with postpartum.  Patient has been on medication for extended period time unsure if it is beneficial.  Patient has a lot coming up over the next few months.  She will reach out to me if she wants to try to wean down on the medication to see if the efficacy is still there.  Will plan to wean from fluoxetine  40 mg to fluoxetine  20 mg daily whenever patient is ready.  Patient denies HI/SI/AVH.

## 2024-07-25 NOTE — Assessment & Plan Note (Signed)
 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  Did review patient's personal, surgical, social, family histories.  Patient is up-to-date on all age-appropriate vaccinations she would like.  Update flu vaccine today.  Patient is too young for CRC screening.  Too young for breast cancer screening.  Patient no longer does cervical cancer screening as she is status post hysterectomy.  Patient was given information at discharge about preventative healthcare maintenance with anticipatory guidance

## 2024-07-25 NOTE — Assessment & Plan Note (Signed)
 Pending urine microscopy rule out microscopic hematuria

## 2024-07-26 ENCOUNTER — Ambulatory Visit: Payer: Self-pay | Admitting: Nurse Practitioner

## 2024-07-26 DIAGNOSIS — R3129 Other microscopic hematuria: Secondary | ICD-10-CM

## 2024-07-28 NOTE — Addendum Note (Signed)
 Addended by: HOPE VEVA PARAS on: 07/28/2024 02:37 PM   Modules accepted: Orders

## 2024-07-28 NOTE — Addendum Note (Signed)
 Addended by: WENDEE LYNWOOD HERO on: 07/28/2024 02:01 PM   Modules accepted: Orders

## 2024-07-28 NOTE — Addendum Note (Signed)
 Addended by: ISADORA RAISIN on: 07/28/2024 02:31 PM   Modules accepted: Orders

## 2024-07-28 NOTE — Telephone Encounter (Unsigned)
 Copied from CRM [id number]. Topic: Clinical - Request for Lab/Test Order >> Jul 27, 2024  2:49 PM Rea BROCKS wrote: Reason for CRM: Patient received a message in MyChart from CMA Janae to schedule a lab visit for a urine re-check. NP Cable advised check within 1-2 weeks. Patient is scheduled for Nov 3rd, 2025. Did not see lab order for urine recheck in chart.   [phone] (patients contact)

## 2024-08-07 ENCOUNTER — Other Ambulatory Visit (INDEPENDENT_AMBULATORY_CARE_PROVIDER_SITE_OTHER)

## 2024-08-07 DIAGNOSIS — R3129 Other microscopic hematuria: Secondary | ICD-10-CM | POA: Diagnosis not present

## 2024-08-08 LAB — URINALYSIS W MICROSCOPIC + REFLEX CULTURE
Bacteria, UA: NONE SEEN /HPF
Bilirubin Urine: NEGATIVE
Glucose, UA: NEGATIVE
Hyaline Cast: NONE SEEN /LPF
Ketones, ur: NEGATIVE
Leukocyte Esterase: NEGATIVE
Nitrites, Initial: NEGATIVE
Protein, ur: NEGATIVE
RBC / HPF: NONE SEEN /HPF (ref 0–2)
Specific Gravity, Urine: 1.019 (ref 1.001–1.035)
Squamous Epithelial / HPF: NONE SEEN /HPF (ref <=5)
WBC, UA: NONE SEEN /HPF (ref 0–5)
pH: 6 (ref 5.0–8.0)

## 2024-08-08 LAB — NO CULTURE INDICATED

## 2024-08-09 ENCOUNTER — Ambulatory Visit: Payer: Self-pay | Admitting: Nurse Practitioner

## 2024-08-09 DIAGNOSIS — Z87891 Personal history of nicotine dependence: Secondary | ICD-10-CM

## 2024-08-09 DIAGNOSIS — R3129 Other microscopic hematuria: Secondary | ICD-10-CM

## 2024-08-15 DIAGNOSIS — M25551 Pain in right hip: Secondary | ICD-10-CM | POA: Diagnosis not present

## 2024-08-21 ENCOUNTER — Encounter: Payer: Self-pay | Admitting: Nurse Practitioner

## 2024-08-21 DIAGNOSIS — F418 Other specified anxiety disorders: Secondary | ICD-10-CM

## 2024-08-22 MED ORDER — ALPRAZOLAM 0.25 MG PO TABS: 0.2500 mg | ORAL_TABLET | Freq: Two times a day (BID) | ORAL | 0 refills | Status: DC | PRN

## 2024-08-22 NOTE — Addendum Note (Signed)
 Addended by: WENDEE LYNWOOD HERO on: 08/22/2024 02:25 PM   Modules accepted: Orders

## 2024-09-05 ENCOUNTER — Encounter: Payer: Self-pay | Admitting: Urology

## 2024-09-05 ENCOUNTER — Ambulatory Visit (INDEPENDENT_AMBULATORY_CARE_PROVIDER_SITE_OTHER): Admitting: Urology

## 2024-09-05 VITALS — BP 125/85 | HR 106 | Ht 64.0 in | Wt 140.0 lb

## 2024-09-05 DIAGNOSIS — R3129 Other microscopic hematuria: Secondary | ICD-10-CM | POA: Diagnosis not present

## 2024-09-05 LAB — URINALYSIS, COMPLETE
Bilirubin, UA: NEGATIVE
Glucose, UA: NEGATIVE
Ketones, UA: NEGATIVE
Leukocytes,UA: NEGATIVE
Nitrite, UA: NEGATIVE
Protein,UA: NEGATIVE
Specific Gravity, UA: 1.015 (ref 1.005–1.030)
Urobilinogen, Ur: 0.2 mg/dL (ref 0.2–1.0)
pH, UA: 6.5 (ref 5.0–7.5)

## 2024-09-05 LAB — MICROSCOPIC EXAMINATION

## 2024-09-05 MED ORDER — DIAZEPAM 10 MG PO TABS: ORAL_TABLET | ORAL | 0 refills | Status: AC

## 2024-09-05 NOTE — Progress Notes (Signed)
 09/05/2024 9:07 AM   Traci Sweeney [birth year] [id number]  Referring provider: Wendee Lynwood HERO, NP [address] [place],  [place] [address]  Chief Complaint  Patient presents with   Hematuria    HPI: Traci Sweeney is a 38 y.o. fe female referred for evaluation and management of hematuria  Urinalysis: 7-10 RBC on UA 07/25/2024; negative RBCs on microscopy repeat UA 08/07/2024 Gross hematuria: Negative Associated lower urinary tract symptoms: None Baseline lower urinary tract symptoms: None Pain: Denied flank, abdominal or pelvic pain Prior urologic history: Urology eval several years ago for recurrent UTI Blood thinners/antiplatelet meds: Negative Tobacco history: 10-pack-year smoking history; quit 10 years ago   PMH: Past Medical History:  Diagnosis Date   Anxiety    GERD (gastroesophageal reflux disease)    Gestational diabetes    Headache    History of kidney stones    HSV-1 infection    Inguinal hernia infant   bilateral   Nephrolithiasis    with first pregnancy   PONV (postoperative nausea and vomiting)    Postpartum depression    Pre-eclampsia    Preventative health care 04/15/2022   UTI (lower urinary tract infection) 2006 - 2007    Dr. Gaston uro eval seconday to frequent infection with negative cysto    Surgical History: Past Surgical History:  Procedure Laterality Date   ABDOMINAL HYSTERECTOMY  June 2021   CESAREAN SECTION N/A 07/17/2015   Procedure: CESAREAN SECTION;  Surgeon: Traci Sweeney;  Location: [facility];  Service: Obstetrics;  Laterality: N/A;   CESAREAN SECTION N/A 01/05/2018   Procedure: REPEAT CESAREAN SECTION;  Surgeon: Traci Sweeney;  Location: [facility];  Service: Obstetrics;  Laterality: N/A;   CESAREAN SECTION N/A 02/09/2019   Procedure: CESAREAN SECTION;  Surgeon: Traci Sweeney;  Location: [facility];  Service: Obstetrics;  Laterality: N/A;   CYSTOSCOPY  10/2005   negative   CYSTOSCOPY N/A 05/01/2020    Procedure: CYSTOSCOPY;  Surgeon: Traci Sweeney;  Location: [facility];  Service: Gynecology;  Laterality: N/A;   HIP ARTHROSCOPY Right 05/06/2021   Procedure: right hip scope labral repair and acetabuloplasty;  Surgeon: Traci Sweeney;  Location: [facility] OR;  Service: Orthopedics;  Laterality: Right;   INGUINAL HERNIA REPAIR Bilateral infant   MYRINGOTOMY  2005   ROBOTIC ASSISTED TOTAL HYSTERECTOMY N/A 05/01/2020   Procedure: XI ROBOTIC ASSISTED TOTAL HYSTERECTOMY;  Surgeon: Traci Sweeney;  Location: [facility];  Service: Gynecology;  Laterality: N/A;  cautery of endometriosis   SALPINGECTOMY  2020   TONSILLECTOMY  age 61   TYMPANOPLASTY  2006   WISDOM TOOTH EXTRACTION     XI ROBOTIC ASSISTED OOPHORECTOMY Bilateral 05/01/2020   Procedure: XI ROBOTIC ASSISTED SALPINGECTOMIES;  Surgeon: Traci Sweeney;  [facility];  Service: Gynecology;  Laterality: Bilateral;    Home Medications:  Allergies as of 09/05/2024   No Known Allergies      Medication List        Accurate as of September 05, 2024  9:07 AM. If you have any questions, ask your nurse or doctor.          acetaminophen  500 MG tablet Commonly known as: TYLENOL  Take 1,000 mg by mouth every 6 (six) hours as needed for moderate pain or headache.   ALPRAZolam  0.25 MG tablet Commonly known as: XANAX  Take 1 tablet (0.25 mg total) by mouth 2 (two) times daily as needed for anxiety.  amphetamine -dextroamphetamine  15 MG tablet Commonly known as: Adderall Take 1 tablet by mouth 2 (two) times daily.   amphetamine -dextroamphetamine  15 MG tablet Commonly known as: Adderall Take 1 tablet by mouth 2 (two) times daily.   amphetamine -dextroamphetamine  15 MG tablet Commonly known as: Adderall Take 1 tablet by mouth 2 (two) times daily.   aspirin-acetaminophen -caffeine 250-250-65 MG tablet Commonly known as: EXCEDRIN MIGRAINE Take 2 tablets by  mouth every 6 (six) hours as needed for headache.   diphenhydrAMINE  25 MG tablet Commonly known as: BENADRYL  Take 25 mg by mouth every 6 (six) hours as needed for allergies or itching.   FLUoxetine  40 MG capsule Commonly known as: PROZAC  Take 40 mg by mouth at bedtime.   fluticasone  50 MCG/ACT nasal spray Commonly known as: FLONASE  Place 2 sprays into both nostrils daily.        Allergies: No Known Allergies  Family History: Family History  Problem Relation Age of Onset   Hypertension Mother    Heart disease Mother    Stroke Father        x 2   Cancer Father        Poems syndrome-treated as a blood cancer-rare   Neuropathy Father    Lung cancer Maternal Grandfather        deceased   Lung cancer Paternal Grandfather        ?   Lymphoma Maternal Uncle    Lung cancer Paternal Aunt        part of lung taken out.    Social History:  reports that she quit smoking about 13 years ago. Her smoking use included cigarettes. She started smoking about 23 years ago. She has a 5 pack-year smoking history. She has never used smokeless tobacco. She reports current alcohol use. She reports that she does not use drugs.   Physical Exam: BP 125/85   Pulse (!) 106   Ht 5' 4 (1.626 m)   Wt 140 lb (63.5 kg)   LMP 03/01/2020   BMI 24.03 kg/m   Constitutional:  Alert and oriented, No acute distress. HEENT: [place] AT. Respiratory: Normal respiratory effort, no increased work of breathing. GI: Abdomen is soft, nontender, nondistended, no abdominal masses GU: No CVA tenderness Psychiatric: Normal mood and affect.  Laboratory:  Urinalysis Microscopy 3-10 RBC   Assessment & Plan:    1.  Microhematuria AUA risk stratification: Intermediate; initially low/negligible risk with persistent microhematuria on repeat urinalysis We discussed the recommended evaluation of intermediate risk hematuria which consist of renal ultrasound and cystoscopy.  The procedures were discussed in detail and  he/she has elected to proceed with further evaluation.  She states she remembers a prior cystoscopy being uncomfortable and was interested in Rx Valium preprocedure.  She was informed she would need a driver All questions were answered RUS order placed and cystoscopy was scheduled   Glendia JAYSON Barba, Sweeney  [facility] [address], [address] [place], [place] [address] [phone]

## 2024-09-05 NOTE — Patient Instructions (Signed)

## 2024-09-06 ENCOUNTER — Encounter: Payer: Self-pay | Admitting: Urology

## 2024-09-07 ENCOUNTER — Ambulatory Visit: Admission: RE | Admit: 2024-09-07 | Discharge: 2024-09-07 | Attending: Urology | Admitting: Urology

## 2024-09-07 DIAGNOSIS — R3129 Other microscopic hematuria: Secondary | ICD-10-CM | POA: Insufficient documentation

## 2024-09-11 ENCOUNTER — Ambulatory Visit

## 2024-09-11 ENCOUNTER — Ambulatory Visit: Payer: Self-pay | Admitting: Podiatry

## 2024-09-11 ENCOUNTER — Encounter: Payer: Self-pay | Admitting: Podiatry

## 2024-09-11 ENCOUNTER — Ambulatory Visit (INDEPENDENT_AMBULATORY_CARE_PROVIDER_SITE_OTHER): Admitting: Podiatry

## 2024-09-11 DIAGNOSIS — L600 Ingrowing nail: Secondary | ICD-10-CM | POA: Diagnosis not present

## 2024-09-11 NOTE — Progress Notes (Signed)
 Chief Complaint  Patient presents with   Nail Problem    Right foot 5th toe was bothersome. Today present with no pain 955 better. Just wants evaluation. Non diabetic.    HPI: 38 y.o. female presents today with with history of recent issue to the right fifth toenail.  States that she was at an amusement park with her family only not in the area and had a double nail growing on the right fifth toenail.  She attempted to cut it out herself and subsequently developed a lot of throbbing pain and discomfort.  When she called to make the appointment the toe was still incredibly painful.  Ever since doing soaks twice daily for the past 2 weeks it has started to resolve on its own and she is not having any pain at this moment.  She would just like get evaluation of the area and see what her options would be.  Past Medical History:  Diagnosis Date   Anxiety    GERD (gastroesophageal reflux disease)    Gestational diabetes    Headache    History of kidney stones    HSV-1 infection    Inguinal hernia infant   bilateral   Nephrolithiasis    with first pregnancy   PONV (postoperative nausea and vomiting)    Postpartum depression    Pre-eclampsia    Preventative health care 04/15/2022   UTI (lower urinary tract infection) 2006 - 2007    Dr. Gaston uro eval seconday to frequent infection with negative cysto   Past Surgical History:  Procedure Laterality Date   ABDOMINAL HYSTERECTOMY  June 2021   CESAREAN SECTION N/A 07/17/2015   Procedure: CESAREAN SECTION;  Surgeon: Gigi Botts, MD;  Location: [facility];  Service: Obstetrics;  Laterality: N/A;   CESAREAN SECTION N/A 01/05/2018   Procedure: REPEAT CESAREAN SECTION;  Surgeon: Sarrah Browning, MD;  Location: [facility];  Service: Obstetrics;  Laterality: N/A;   CESAREAN SECTION N/A 02/09/2019   Procedure: CESAREAN SECTION;  Surgeon: Okey Leader, MD;  Location: [facility];  Service: Obstetrics;  Laterality: N/A;   CYSTOSCOPY  10/2005    negative   CYSTOSCOPY N/A 05/01/2020   Procedure: CYSTOSCOPY;  Surgeon: Sarrah Browning, MD;  Location: [facility];  Service: Gynecology;  Laterality: N/A;   HIP ARTHROSCOPY Right 05/06/2021   Procedure: right hip scope labral repair and acetabuloplasty;  Surgeon: Sharl Selinda Dover, MD;  Location: [facility] OR;  Service: Orthopedics;  Laterality: Right;   INGUINAL HERNIA REPAIR Bilateral infant   MYRINGOTOMY  2005   ROBOTIC ASSISTED TOTAL HYSTERECTOMY N/A 05/01/2020   Procedure: XI ROBOTIC ASSISTED TOTAL HYSTERECTOMY;  Surgeon: Sarrah Browning, MD;  Location: [facility];  Service: Gynecology;  Laterality: N/A;  cautery of endometriosis   SALPINGECTOMY  2020   TONSILLECTOMY  age 78   TYMPANOPLASTY  2006   WISDOM TOOTH EXTRACTION     XI ROBOTIC ASSISTED OOPHORECTOMY Bilateral 05/01/2020   Procedure: XI ROBOTIC ASSISTED SALPINGECTOMIES;  Surgeon: Sarrah Browning, MD;  Location: [facility];  Service: Gynecology;  Laterality: Bilateral;   No Known Allergies   Physical Exam: Palpable pedal pulses.  No appreciable edema to the right fifth toe.  No ingrown toenail could be observed today along the lateral border.  No sign of a pterygoid nail present either.  No clinical signs of infection or calor present.  Assessment/Plan of Care: 1. Ingrown toenail     Discussed findings.  Discussed the option of a PNA  procedure to the right hallux lateral nail border to prevent regrowth.  She can have this at any time if it is not grossly infected.  However there does need to be some of the nail present to perform the procedure.  She expressed understanding and may opt for this in the future.  Offered to send in an antibiotic and a Medrol Dosepak for when this may flareup again but she declined.  Follow-up as needed   Awanda CHARM [place], DPM, FACFAS [facility]     [address] [place], [place]  [address]                Office [phone]  Fax [phone]

## 2024-09-13 ENCOUNTER — Encounter: Payer: Self-pay | Admitting: Podiatry

## 2024-09-13 MED ORDER — CEPHALEXIN 500 MG PO CAPS: 500.0000 mg | ORAL_CAPSULE | Freq: Three times a day (TID) | ORAL | 0 refills | Status: AC

## 2024-09-13 MED ORDER — METHYLPREDNISOLONE 4 MG PO TBPK: ORAL_TABLET | ORAL | 0 refills | Status: DC

## 2024-09-14 ENCOUNTER — Ambulatory Visit: Admitting: Podiatry

## 2024-09-14 ENCOUNTER — Encounter: Payer: Self-pay | Admitting: Nurse Practitioner

## 2024-09-16 ENCOUNTER — Ambulatory Visit: Payer: Self-pay | Admitting: Urology

## 2024-10-09 ENCOUNTER — Encounter: Payer: Self-pay | Admitting: Urology

## 2024-10-09 ENCOUNTER — Ambulatory Visit: Admitting: Urology

## 2024-10-09 VITALS — BP 120/84 | HR 103 | Ht 64.0 in | Wt 145.0 lb

## 2024-10-09 DIAGNOSIS — R3129 Other microscopic hematuria: Secondary | ICD-10-CM | POA: Diagnosis not present

## 2024-10-09 NOTE — Patient Instructions (Signed)
 Scheduling number: [id number]

## 2024-10-09 NOTE — Progress Notes (Signed)
" ° °  10/09/2024  CC:  Chief Complaint  Patient presents with   Cysto    HPI: Refer to my prior note 09/05/2024.  RUS 09/07/2024 showed mild dilation of right renal pelvis and calyces and a probable hepatic hemangioma.  UA today 11-30 RBC  Blood pressure 120/84, pulse (!) 103, height 5' 4 (1.626 m), weight 145 lb (65.8 kg), last menstrual period 03/01/2020. NED. A&Ox3.   No respiratory distress   Abd soft, NT, ND Normal external genitalia with patent urethral meatus  Cystoscopy Procedure Note  Patient identification was confirmed, informed consent was obtained, and patient was prepped using Betadine solution.  Lidocaine  jelly was administered per urethral meatus.    Procedure: - Flexible cystoscope introduced, without any difficulty.   - Thorough search of the bladder revealed:    normal urethral meatus    normal urothelium    no stones    no ulcers     no tumors    no urethral polyps    no trabeculation  - Ureteral orifices were normal in position and appearance.  Post-Procedure: - Patient tolerated the procedure well  Assessment/ Plan: No lower tract abnormalities on cystoscopy RUS with mild pelvocaliectasis Schedule CT urogram for further evaluation    Glendia JAYSON Barba, MD "

## 2024-10-10 LAB — MICROSCOPIC EXAMINATION

## 2024-10-12 LAB — URINALYSIS, COMPLETE
Bilirubin, UA: NEGATIVE
Glucose, UA: NEGATIVE
Ketones, UA: NEGATIVE
Leukocytes,UA: NEGATIVE
Nitrite, UA: NEGATIVE
Protein,UA: NEGATIVE
Specific Gravity, UA: 1.015 (ref 1.005–1.030)
Urobilinogen, Ur: 0.2 mg/dL (ref 0.2–1.0)
pH, UA: 6 (ref 5.0–7.5)

## 2024-10-12 LAB — MICROSCOPIC EXAMINATION

## 2024-11-03 ENCOUNTER — Other Ambulatory Visit: Payer: Self-pay | Admitting: Nurse Practitioner

## 2024-11-09 ENCOUNTER — Ambulatory Visit

## 2024-11-15 ENCOUNTER — Other Ambulatory Visit
